# Patient Record
Sex: Female | Born: 1935 | Race: White | Hispanic: No | Marital: Married | Smoking: Never smoker
Health system: Southern US, Community
[De-identification: ages and names within clinical notes are randomized; demographics above are authoritative.]

## PROBLEM LIST (undated history)

## (undated) DIAGNOSIS — Z950 Presence of cardiac pacemaker: Secondary | ICD-10-CM

## (undated) DIAGNOSIS — Z9289 Personal history of other medical treatment: Secondary | ICD-10-CM

## (undated) DIAGNOSIS — H547 Unspecified visual loss: Secondary | ICD-10-CM

## (undated) DIAGNOSIS — I442 Atrioventricular block, complete: Secondary | ICD-10-CM

## (undated) DIAGNOSIS — R55 Syncope and collapse: Secondary | ICD-10-CM

## (undated) DIAGNOSIS — F0390 Unspecified dementia without behavioral disturbance: Secondary | ICD-10-CM

## (undated) DIAGNOSIS — M1990 Unspecified osteoarthritis, unspecified site: Secondary | ICD-10-CM

## (undated) DIAGNOSIS — G629 Polyneuropathy, unspecified: Secondary | ICD-10-CM

## (undated) HISTORY — PX: JOINT REPLACEMENT: SHX530

## (undated) HISTORY — DX: Atrioventricular block, complete: I44.2

## (undated) HISTORY — PX: FRACTURE SURGERY: SHX138

## (undated) HISTORY — PX: SKIN GRAFT: SHX250

## (undated) HISTORY — PX: CATARACT EXTRACTION W/ INTRAOCULAR LENS  IMPLANT, BILATERAL: SHX1307

## (undated) HISTORY — PX: TONSILLECTOMY AND ADENOIDECTOMY: SUR1326

---
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
## 1978-09-17 HISTORY — PX: TOTAL KNEE ARTHROPLASTY: SHX125

## 1978-09-17 HISTORY — PX: CHOLECYSTECTOMY: SHX55

## 1988-09-16 HISTORY — PX: FEMUR SURGERY: SHX943

## 2007-10-02 ENCOUNTER — Ambulatory Visit: Payer: Self-pay | Admitting: Internal Medicine

## 2007-10-11 ENCOUNTER — Ambulatory Visit: Payer: Self-pay | Admitting: Internal Medicine

## 2007-10-11 LAB — CONVERTED CEMR LAB
CO2: 31 meq/L (ref 19–32)
Calcium: 8.8 mg/dL (ref 8.4–10.5)
Chloride: 104 meq/L (ref 96–112)
Creatinine, Ser: 0.7 mg/dL (ref 0.4–1.2)
Eosinophils Relative: 0.6 % (ref 0.0–5.0)
GFR calc Af Amer: 102 mL/min
Glucose, Bld: 94 mg/dL (ref 70–99)
Hemoglobin: 11.1 g/dL — ABNORMAL LOW (ref 12.0–15.0)
Lymphocytes Relative: 24.2 % (ref 12.0–46.0)
MCV: 92.2 fL (ref 78.0–100.0)
Monocytes Absolute: 0.4 10*3/uL (ref 0.1–1.0)
Monocytes Relative: 5.5 % (ref 3.0–12.0)
Neutro Abs: 5.4 10*3/uL (ref 1.4–7.7)
Neutrophils Relative %: 69.1 % (ref 43.0–77.0)
Platelets: 265 10*3/uL (ref 150–400)
RBC: 3.6 M/uL — ABNORMAL LOW (ref 3.87–5.11)
RDW: 15.1 % — ABNORMAL HIGH (ref 11.5–14.6)
WBC: 7.6 10*3/uL (ref 4.5–10.5)
aPTT: 31.9 s — ABNORMAL HIGH (ref 21.7–29.8)

## 2007-10-17 ENCOUNTER — Ambulatory Visit: Payer: Self-pay | Admitting: Internal Medicine

## 2007-10-17 ENCOUNTER — Ambulatory Visit (HOSPITAL_COMMUNITY): Admission: AD | Admit: 2007-10-17 | Discharge: 2007-10-19 | Payer: Self-pay | Admitting: Internal Medicine

## 2007-11-07 ENCOUNTER — Ambulatory Visit: Payer: Self-pay

## 2007-12-02 ENCOUNTER — Emergency Department (HOSPITAL_COMMUNITY): Admission: EM | Admit: 2007-12-02 | Discharge: 2007-12-02 | Payer: Self-pay | Admitting: Emergency Medicine

## 2008-02-04 ENCOUNTER — Ambulatory Visit: Payer: Self-pay | Admitting: Internal Medicine

## 2008-04-24 ENCOUNTER — Encounter: Payer: Self-pay | Admitting: Internal Medicine

## 2008-12-28 DIAGNOSIS — I459 Conduction disorder, unspecified: Secondary | ICD-10-CM

## 2008-12-29 ENCOUNTER — Ambulatory Visit: Payer: Self-pay | Admitting: Internal Medicine

## 2009-04-02 ENCOUNTER — Encounter: Payer: Self-pay | Admitting: Internal Medicine

## 2009-06-15 ENCOUNTER — Emergency Department (HOSPITAL_COMMUNITY): Admission: EM | Admit: 2009-06-15 | Discharge: 2009-06-15 | Payer: Self-pay | Admitting: Emergency Medicine

## 2009-08-03 ENCOUNTER — Encounter: Payer: Self-pay | Admitting: Internal Medicine

## 2009-10-20 ENCOUNTER — Encounter (HOSPITAL_BASED_OUTPATIENT_CLINIC_OR_DEPARTMENT_OTHER)
Admission: RE | Admit: 2009-10-20 | Discharge: 2010-01-18 | Payer: Self-pay | Source: Home / Self Care | Attending: Internal Medicine | Admitting: Internal Medicine

## 2009-12-20 ENCOUNTER — Ambulatory Visit: Payer: Self-pay | Admitting: Surgery

## 2010-01-11 ENCOUNTER — Encounter (INDEPENDENT_AMBULATORY_CARE_PROVIDER_SITE_OTHER): Payer: Self-pay | Admitting: *Deleted

## 2010-01-20 ENCOUNTER — Encounter (HOSPITAL_BASED_OUTPATIENT_CLINIC_OR_DEPARTMENT_OTHER)
Admission: RE | Admit: 2010-01-20 | Discharge: 2010-02-15 | Payer: Self-pay | Source: Home / Self Care | Attending: Internal Medicine | Admitting: Internal Medicine

## 2010-02-15 NOTE — Letter (Signed)
Summary: Device-Delinquent Phone Journalist, newspaper, Main Office  [address]   [place], [place] [address]   Phone: [phone]  Fax: [phone]     April 02, 2009 MRN: [id number]   [name] [address] [place], [place]  [address]   Dear Ms. [name],  According to our records, you were scheduled for a device phone transmission on  March 31, 2009.     We did not receive any results from this check.  If you transmitted on your scheduled day, please call us to help troubleshoot your system.  If you forgot to send your transmission, please send one upon receipt of this letter.  Thank you,   Architectural technologist [facility]

## 2010-02-15 NOTE — Letter (Signed)
Summary: Device-Delinquent Phone Journalist, newspaper, Main Office  [address]   [place], [place] [address]   Phone: [phone]  Fax: [phone]     August 03, 2009 MRN: [id number]   [name] [address] [place], [place]  [address]   Dear Ms. LANSDOWNE,  According to our records, you were scheduled for a device phone transmission on  03-31-2009.     We did not receive any results from this check.  If you transmitted on your scheduled day, please call us to help troubleshoot your system.  If you forgot to send your transmission, please send one upon receipt of this letter.  Thank you,   Architectural technologist [facility]

## 2010-02-17 ENCOUNTER — Encounter (HOSPITAL_BASED_OUTPATIENT_CLINIC_OR_DEPARTMENT_OTHER): Payer: Medicare Other | Attending: Internal Medicine

## 2010-02-17 DIAGNOSIS — G5790 Unspecified mononeuropathy of unspecified lower limb: Secondary | ICD-10-CM | POA: Insufficient documentation

## 2010-02-17 DIAGNOSIS — X58XXXA Exposure to other specified factors, initial encounter: Secondary | ICD-10-CM | POA: Insufficient documentation

## 2010-02-17 DIAGNOSIS — L97409 Non-pressure chronic ulcer of unspecified heel and midfoot with unspecified severity: Secondary | ICD-10-CM | POA: Insufficient documentation

## 2010-02-17 DIAGNOSIS — S91309A Unspecified open wound, unspecified foot, initial encounter: Secondary | ICD-10-CM | POA: Insufficient documentation

## 2010-02-17 NOTE — Letter (Signed)
Summary: Device-Delinquent Check  [facility], Main Office  [address]   [place], [place] [address]   Phone: [phone]  Fax: [phone]     January 11, 2010 MRN: [id number]   [name] [address] [place], [place]  [address]   Dear [name],  According to our records, you have not had your implanted device checked in the recommended period of time.  We are unable to determine appropriate device function without checking your device on a regular basis.  Please call our office to schedule an appointment with Dr Graciela Husbands as soon as possible.  If you are having your device checked by another physician, please call us so that we may update our records.  Thank you,  Vella Kohler  January 11, 2010 4:28 PM  [facility] certified

## 2010-03-09 ENCOUNTER — Encounter: Payer: Medicare Other | Admitting: Internal Medicine

## 2010-03-15 ENCOUNTER — Encounter: Payer: Medicare Other | Admitting: Internal Medicine

## 2010-03-17 ENCOUNTER — Encounter (HOSPITAL_BASED_OUTPATIENT_CLINIC_OR_DEPARTMENT_OTHER): Payer: Medicare Other | Attending: Internal Medicine

## 2010-03-17 DIAGNOSIS — S91309A Unspecified open wound, unspecified foot, initial encounter: Secondary | ICD-10-CM | POA: Insufficient documentation

## 2010-03-17 DIAGNOSIS — X58XXXA Exposure to other specified factors, initial encounter: Secondary | ICD-10-CM | POA: Insufficient documentation

## 2010-03-17 DIAGNOSIS — L97409 Non-pressure chronic ulcer of unspecified heel and midfoot with unspecified severity: Secondary | ICD-10-CM | POA: Insufficient documentation

## 2010-03-17 DIAGNOSIS — G5790 Unspecified mononeuropathy of unspecified lower limb: Secondary | ICD-10-CM | POA: Insufficient documentation

## 2010-04-07 ENCOUNTER — Encounter: Payer: Self-pay | Admitting: Internal Medicine

## 2010-04-19 ENCOUNTER — Ambulatory Visit (INDEPENDENT_AMBULATORY_CARE_PROVIDER_SITE_OTHER): Payer: Medicare Other | Admitting: Internal Medicine

## 2010-04-19 ENCOUNTER — Encounter: Payer: Self-pay | Admitting: Internal Medicine

## 2010-04-19 DIAGNOSIS — I459 Conduction disorder, unspecified: Secondary | ICD-10-CM

## 2010-04-19 DIAGNOSIS — Z950 Presence of cardiac pacemaker: Secondary | ICD-10-CM | POA: Insufficient documentation

## 2010-04-19 NOTE — Assessment & Plan Note (Signed)
The patient's device was interrogated.  The information was reviewed. No changes were made in the programming.    

## 2010-04-19 NOTE — Patient Instructions (Signed)
Your physician recommends that you schedule a follow-up appointment in: [place] IN 6 MONTHS

## 2010-04-19 NOTE — Assessment & Plan Note (Signed)
Stable

## 2010-04-19 NOTE — Progress Notes (Signed)
  HPI  [name] is a 77 y.o. female seen in followup for pacemaker implanted in [place] for complete heart block Which was changed out here in the fall of 2010  . She is getting precipitating undergoing graft surgery for nonhealing wound on her leg. She denies problems with chest pain or shortness of breath. She denies sob or chest pain  Past Medical History  Diagnosis Date  . Complete heart block   . S/P placement of cardiac pacemaker     FOR COMPLETE HEART BLOCK....MEDTRONIC ADAPTA R01    Past Surgical History  Procedure Date  . Total knee arthroplasty     BILATERAL  . Cholecystectomy   . Appendectomy   . Insert / replace / remove pacemaker     PACEMAKER IMPLANT- MEDTRONIC ADAPTA R01    Current Outpatient Prescriptions  Medication Sig Dispense Refill  . cephALEXin (KEFLEX) 500 MG capsule Take 500 mg by mouth 2 (two) times daily. For 10 days for foot infection       . cholecalciferol (VITAMIN D) 1000 UNITS tablet Take 1,000 Units by mouth daily.        . DULoxetine (CYMBALTA) 60 MG capsule Take 60 mg by mouth 2 (two) times daily.        . fish oil-omega-3 fatty acids 1000 MG capsule Take 2 g by mouth daily.        Marland Kitchen gabapentin (NEURONTIN) 300 MG capsule Take 300 mg by mouth at bedtime. TAKE 2 TABS QHS       . naproxen sodium (ANAPROX) 220 MG tablet Take 220 mg by mouth 2 (two) times daily with a meal.        . traMADol (ULTRAM) 50 MG tablet Take 50 mg by mouth every 6 (six) hours as needed.        . traZODone (DESYREL) 50 MG tablet Take 50 mg by mouth at bedtime.        Marland Kitchen DISCONTD: aspirin 81 MG EC tablet Take 81 mg by mouth daily.        Marland Kitchen DISCONTD: Magnesium 250 MG TABS Take 2 tablets by mouth daily.        Marland Kitchen DISCONTD: potassium chloride (KLOR-CON) 10 MEQ CR tablet Take 10 mEq by mouth daily.          Allergies  Allergen Reactions  . Latex     Bandaging and tape     Review of Systems negative except from HPI and PMH  Physical Exam Well developed and well  nourished in no acute distress HENT normal E scleral and icterus clear Neck Supple Clear to ausculation Regular rate and rhythm, no murmurs gallops or rub Soft with active bowel sounds No clubbing cyanosis; her foot is well  Bandaged Alert and oriented, grossly normal motor and sensory function Skin Warm and Dry     Assessment and  Plan

## 2010-04-21 ENCOUNTER — Encounter (HOSPITAL_BASED_OUTPATIENT_CLINIC_OR_DEPARTMENT_OTHER): Payer: Medicare Other | Attending: Internal Medicine

## 2010-04-21 DIAGNOSIS — G5790 Unspecified mononeuropathy of unspecified lower limb: Secondary | ICD-10-CM | POA: Insufficient documentation

## 2010-04-21 DIAGNOSIS — S91309A Unspecified open wound, unspecified foot, initial encounter: Secondary | ICD-10-CM | POA: Insufficient documentation

## 2010-04-21 DIAGNOSIS — L97409 Non-pressure chronic ulcer of unspecified heel and midfoot with unspecified severity: Secondary | ICD-10-CM | POA: Insufficient documentation

## 2010-04-21 DIAGNOSIS — X58XXXA Exposure to other specified factors, initial encounter: Secondary | ICD-10-CM | POA: Insufficient documentation

## 2010-05-19 ENCOUNTER — Encounter (HOSPITAL_BASED_OUTPATIENT_CLINIC_OR_DEPARTMENT_OTHER): Payer: Medicare Other | Attending: Internal Medicine

## 2010-05-19 DIAGNOSIS — L97509 Non-pressure chronic ulcer of other part of unspecified foot with unspecified severity: Secondary | ICD-10-CM | POA: Insufficient documentation

## 2010-05-19 DIAGNOSIS — Z79899 Other long term (current) drug therapy: Secondary | ICD-10-CM | POA: Insufficient documentation

## 2010-05-19 DIAGNOSIS — Z950 Presence of cardiac pacemaker: Secondary | ICD-10-CM | POA: Insufficient documentation

## 2010-05-19 DIAGNOSIS — G5790 Unspecified mononeuropathy of unspecified lower limb: Secondary | ICD-10-CM | POA: Insufficient documentation
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
## 2010-05-31 NOTE — Op Note (Signed)
[name], [name] NO.:  [id number]   MEDICAL RECORD NO.:  [id number]          PATIENT TYPE:  OIB   LOCATION:  2899                         FACILITY:  [facility]   PHYSICIAN:  Duke Salvia, MD, FACCDATE OF BIRTH:  [birth year]   DATE OF PROCEDURE:  10/17/2007  DATE OF DISCHARGE:                               OPERATIVE REPORT   PREOPERATIVE DIAGNOSIS:  Previously implanted pacemaker, now at end of  life.   POSTOPERATIVE DIAGNOSIS:  Previously implanted pacemaker, now at end of  life.   PROCEDURES:  Temporary transvenous pacemaker insertion via the right  femoral vein; explantation of the pulse generator; pocket revision;  insertion of a new single chamber pulse generator.   COMPLICATIONS:  None apparent.   PROCEDURE IN DETAIL:  Following obtaining informed consent, the patient  was brought to the Electrophysiology Laboratory and placed on the  fluoroscopic table in supine position.  After routine prep and drape,  cardiac catheterization was performed via the right femoral vein.  A 6-  French sheath was placed and through this was passed a temporary  transvenous pacing lead up into the right ventricle where pacing was  successfully accomplished.  This pacing was then placed in the backup  mode.   I then prepped again and after routine prep and drape of the left upper  chest, lidocaine was infiltrated along the line of the previous incision  and carried down to the layer of the device pocket using sharp  dissection and electrocautery.  The pocket was opened.  The device was  explanted.  The temporary transvenous pacemaker was activated.  The  leads were removed from the can actually without difficulty which was a  pleasant surprise.  The previously implanted ventricular lead was a 4456  lead implanted on November 2002, serial #[id number].  There was no intrinsic  ventricular rhythm, but the sensed R-wave from the pacer was 16.3 with a  pace impedance of 384,  threshold 0.3 volts at 0.5 milliseconds, and the  current of threshold was 1.4 mA.  This lead was then attached to a  Medtronic Adapta R01 pulse generator, serial [id number].  Ventricular  pacing was identified.  The atrial lead had retracted and it was  vertically deployed in the right atrium.  The patient was in atrial  fibrillation, so it was elected to cap the right atrial lead.  The lead  and the pulse generator were then placed in the pocket and secured to  the prepectoral fascia.  Hemostasis was obtained.  The pocket was  copiously irrigated with antibiotic containing saline solution.  I  should note that I had removed the floor of the pocket in terms to the  fibrous tissue to try to promote adequacy of fresh tissue perfusion to  minimize the likelihood of infection.  The wound was then closed in 3  layers in normal fashion.  The wound was washed, dried, and a benzoin  and Steri-Strip dressing was applied.  Needle counts, sponge counts, and  instrument counts were correct at the end of procedure according to the  staff.  The patient  tolerated the procedure without apparent  complication.      Duke Salvia, MD, [facility]  Electronically Signed    SCK/MEDQ  D:  10/17/2007  T:  10/17/2007  Job:  [id number]

## 2010-05-31 NOTE — Op Note (Signed)
[name], [name] NO.:  [id number]   MEDICAL RECORD NO.:  [id number]          PATIENT TYPE:  INP   LOCATION:  3705                         FACILITY:  [facility]   PHYSICIAN:  Duke Salvia, MD, FACCDATE OF BIRTH:  [birth year]   DATE OF PROCEDURE:  10/18/2007  DATE OF DISCHARGE:                               OPERATIVE REPORT   [name] is brought to the EP lab today following the replacement  of a dual-chamber pacemaker generator with a single chamber device  yesterday at my hands which was an error.  She received preprocedural  antibiotics after routine prep and drape.  Lidocaine was infiltrated  along the line of the previous incision.  Incision was opened and  carried down to the layer of the device pocket.  The device was  explanted.  The atrial lead which had been capped yesterday was opened  up.  Sensing demonstrated P-wave of 1.7, but it failed to capture and  fluoroscopy of the lead confirmed it was in a vertical position in the  right atrium, hence its failure to function normally.  The leads were  then attached to a new Medtronic generator, model VEDRO1, serial number  [id number].  P-synchronous pacing was identified.  Surgicel was placed at  the cephalad aspect of the pocket.  The pocket was copiously irrigated  with antibiotic containing saline solution.  The wound was closed in 3  layers in normal fashion.  The wound was washed, dried, and a benzoin  and Steri-Strip dressing was applied.  Needle counts, sponge counts, and  instrument counts were correct at the end of the procedure according to  the staff.  The patient tolerated the procedure without apparent  complication.      Duke Salvia, MD, [facility]  Electronically Signed     SCK/MEDQ  D:  10/18/2007  T:  10/19/2007  Job:  [id number]

## 2010-06-23 ENCOUNTER — Encounter (HOSPITAL_BASED_OUTPATIENT_CLINIC_OR_DEPARTMENT_OTHER): Payer: Medicare Other | Attending: Internal Medicine

## 2010-06-23 DIAGNOSIS — L97509 Non-pressure chronic ulcer of other part of unspecified foot with unspecified severity: Secondary | ICD-10-CM | POA: Insufficient documentation

## 2010-06-23 DIAGNOSIS — Z79899 Other long term (current) drug therapy: Secondary | ICD-10-CM | POA: Insufficient documentation

## 2010-06-23 DIAGNOSIS — Z950 Presence of cardiac pacemaker: Secondary | ICD-10-CM | POA: Insufficient documentation

## 2010-06-23 DIAGNOSIS — G5790 Unspecified mononeuropathy of unspecified lower limb: Secondary | ICD-10-CM | POA: Insufficient documentation

## 2010-07-21 ENCOUNTER — Encounter (HOSPITAL_BASED_OUTPATIENT_CLINIC_OR_DEPARTMENT_OTHER): Payer: Medicare Other | Attending: Internal Medicine

## 2010-07-21 DIAGNOSIS — X58XXXA Exposure to other specified factors, initial encounter: Secondary | ICD-10-CM | POA: Insufficient documentation

## 2010-07-21 DIAGNOSIS — S99929A Unspecified injury of unspecified foot, initial encounter: Secondary | ICD-10-CM | POA: Insufficient documentation

## 2010-07-21 DIAGNOSIS — L97509 Non-pressure chronic ulcer of other part of unspecified foot with unspecified severity: Secondary | ICD-10-CM | POA: Insufficient documentation

## 2010-07-21 DIAGNOSIS — L84 Corns and callosities: Secondary | ICD-10-CM | POA: Insufficient documentation

## 2010-07-21 DIAGNOSIS — S8990XA Unspecified injury of unspecified lower leg, initial encounter: Secondary | ICD-10-CM | POA: Insufficient documentation

## 2010-07-21 DIAGNOSIS — G589 Mononeuropathy, unspecified: Secondary | ICD-10-CM | POA: Insufficient documentation

## 2010-08-18 ENCOUNTER — Encounter (HOSPITAL_BASED_OUTPATIENT_CLINIC_OR_DEPARTMENT_OTHER): Payer: Medicare Other | Attending: Internal Medicine

## 2010-08-18 DIAGNOSIS — S8990XA Unspecified injury of unspecified lower leg, initial encounter: Secondary | ICD-10-CM | POA: Insufficient documentation

## 2010-08-18 DIAGNOSIS — L84 Corns and callosities: Secondary | ICD-10-CM | POA: Insufficient documentation

## 2010-08-18 DIAGNOSIS — X58XXXA Exposure to other specified factors, initial encounter: Secondary | ICD-10-CM | POA: Insufficient documentation

## 2010-08-18 DIAGNOSIS — G589 Mononeuropathy, unspecified: Secondary | ICD-10-CM | POA: Insufficient documentation

## 2010-08-18 DIAGNOSIS — S99929A Unspecified injury of unspecified foot, initial encounter: Secondary | ICD-10-CM | POA: Insufficient documentation

## 2010-08-18 DIAGNOSIS — L97509 Non-pressure chronic ulcer of other part of unspecified foot with unspecified severity: Secondary | ICD-10-CM | POA: Insufficient documentation

## 2010-09-22 ENCOUNTER — Encounter (HOSPITAL_BASED_OUTPATIENT_CLINIC_OR_DEPARTMENT_OTHER): Payer: Medicare Other | Attending: Internal Medicine

## 2010-09-22 DIAGNOSIS — G5790 Unspecified mononeuropathy of unspecified lower limb: Secondary | ICD-10-CM | POA: Insufficient documentation

## 2010-09-22 DIAGNOSIS — L97509 Non-pressure chronic ulcer of other part of unspecified foot with unspecified severity: Secondary | ICD-10-CM | POA: Insufficient documentation

## 2010-10-20 ENCOUNTER — Encounter (HOSPITAL_BASED_OUTPATIENT_CLINIC_OR_DEPARTMENT_OTHER): Payer: Medicare Other | Attending: Internal Medicine

## 2010-10-20 DIAGNOSIS — G5790 Unspecified mononeuropathy of unspecified lower limb: Secondary | ICD-10-CM | POA: Insufficient documentation

## 2010-10-20 DIAGNOSIS — L97509 Non-pressure chronic ulcer of other part of unspecified foot with unspecified severity: Secondary | ICD-10-CM | POA: Insufficient documentation

## 2011-01-12 ENCOUNTER — Encounter (HOSPITAL_BASED_OUTPATIENT_CLINIC_OR_DEPARTMENT_OTHER): Payer: Medicare Other

## 2011-01-19 ENCOUNTER — Encounter (HOSPITAL_BASED_OUTPATIENT_CLINIC_OR_DEPARTMENT_OTHER): Payer: Medicare Other | Attending: Internal Medicine

## 2011-01-19 DIAGNOSIS — L988 Other specified disorders of the skin and subcutaneous tissue: Secondary | ICD-10-CM | POA: Insufficient documentation

## 2011-01-19 DIAGNOSIS — L84 Corns and callosities: Secondary | ICD-10-CM | POA: Insufficient documentation

## 2011-01-19 DIAGNOSIS — G5790 Unspecified mononeuropathy of unspecified lower limb: Secondary | ICD-10-CM | POA: Insufficient documentation

## 2011-01-19 DIAGNOSIS — L905 Scar conditions and fibrosis of skin: Secondary | ICD-10-CM | POA: Insufficient documentation

## 2011-02-09 ENCOUNTER — Encounter (HOSPITAL_BASED_OUTPATIENT_CLINIC_OR_DEPARTMENT_OTHER): Payer: Medicare Other

## 2011-02-23 ENCOUNTER — Encounter (HOSPITAL_BASED_OUTPATIENT_CLINIC_OR_DEPARTMENT_OTHER): Payer: Medicare Other | Attending: Internal Medicine

## 2011-02-23 DIAGNOSIS — Z8614 Personal history of Methicillin resistant Staphylococcus aureus infection: Secondary | ICD-10-CM | POA: Insufficient documentation

## 2011-02-23 DIAGNOSIS — Z950 Presence of cardiac pacemaker: Secondary | ICD-10-CM | POA: Insufficient documentation

## 2011-02-23 DIAGNOSIS — G5790 Unspecified mononeuropathy of unspecified lower limb: Secondary | ICD-10-CM | POA: Insufficient documentation

## 2011-02-23 DIAGNOSIS — Z79899 Other long term (current) drug therapy: Secondary | ICD-10-CM | POA: Insufficient documentation

## 2011-02-23 DIAGNOSIS — Y838 Other surgical procedures as the cause of abnormal reaction of the patient, or of later complication, without mention of misadventure at the time of the procedure: Secondary | ICD-10-CM | POA: Insufficient documentation

## 2011-02-23 DIAGNOSIS — L97509 Non-pressure chronic ulcer of other part of unspecified foot with unspecified severity: Secondary | ICD-10-CM | POA: Insufficient documentation

## 2011-02-23 DIAGNOSIS — T85698A Other mechanical complication of other specified internal prosthetic devices, implants and grafts, initial encounter: Secondary | ICD-10-CM | POA: Insufficient documentation

## 2011-03-01 ENCOUNTER — Telehealth: Payer: Self-pay | Admitting: Internal Medicine

## 2011-03-01 NOTE — Telephone Encounter (Signed)
03-01-11 pt's h# changed to non published number, lmm @ 1020a for pt's dtr lisa Crowson to call to set up pacemaker ck with klein/mt

## 2011-03-23 ENCOUNTER — Encounter (HOSPITAL_BASED_OUTPATIENT_CLINIC_OR_DEPARTMENT_OTHER): Payer: Medicare Other | Attending: Internal Medicine

## 2011-03-23 DIAGNOSIS — L97509 Non-pressure chronic ulcer of other part of unspecified foot with unspecified severity: Secondary | ICD-10-CM | POA: Insufficient documentation

## 2011-03-23 DIAGNOSIS — Y838 Other surgical procedures as the cause of abnormal reaction of the patient, or of later complication, without mention of misadventure at the time of the procedure: Secondary | ICD-10-CM | POA: Insufficient documentation

## 2011-03-23 DIAGNOSIS — Z79899 Other long term (current) drug therapy: Secondary | ICD-10-CM | POA: Insufficient documentation

## 2011-03-23 DIAGNOSIS — G5790 Unspecified mononeuropathy of unspecified lower limb: Secondary | ICD-10-CM | POA: Insufficient documentation

## 2011-04-11 ENCOUNTER — Encounter: Payer: Medicare Other | Admitting: Internal Medicine

## 2011-04-17 ENCOUNTER — Other Ambulatory Visit: Payer: Self-pay | Admitting: Cardiology

## 2011-04-17 ENCOUNTER — Encounter: Payer: Medicare Other | Admitting: Internal Medicine

## 2011-04-17 DIAGNOSIS — I739 Peripheral vascular disease, unspecified: Secondary | ICD-10-CM

## 2011-04-18 ENCOUNTER — Encounter (INDEPENDENT_AMBULATORY_CARE_PROVIDER_SITE_OTHER): Payer: Medicare Other

## 2011-04-18 DIAGNOSIS — L98499 Non-pressure chronic ulcer of skin of other sites with unspecified severity: Secondary | ICD-10-CM

## 2011-04-18 DIAGNOSIS — I739 Peripheral vascular disease, unspecified: Secondary | ICD-10-CM

## 2011-04-20 ENCOUNTER — Encounter (HOSPITAL_BASED_OUTPATIENT_CLINIC_OR_DEPARTMENT_OTHER): Payer: Medicare Other

## 2011-05-17 ENCOUNTER — Encounter: Payer: Medicare Other | Admitting: Internal Medicine

## 2011-05-18 ENCOUNTER — Encounter (HOSPITAL_BASED_OUTPATIENT_CLINIC_OR_DEPARTMENT_OTHER): Payer: Medicare Other

## 2011-06-21 ENCOUNTER — Encounter: Payer: Self-pay | Admitting: *Deleted

## 2011-06-27 ENCOUNTER — Encounter: Payer: Medicare Other | Admitting: Internal Medicine

## 2011-08-02 ENCOUNTER — Inpatient Hospital Stay (HOSPITAL_COMMUNITY)
Admission: EM | Admit: 2011-08-02 | Discharge: 2011-08-07 | DRG: 690 | Disposition: A | Discharge status: Skilled Nursing Facility | Payer: Medicare Other | Attending: Internal Medicine | Admitting: Internal Medicine

## 2011-08-02 ENCOUNTER — Emergency Department (HOSPITAL_COMMUNITY): Payer: Medicare Other

## 2011-08-02 ENCOUNTER — Encounter (HOSPITAL_COMMUNITY): Payer: Self-pay

## 2011-08-02 DIAGNOSIS — I459 Conduction disorder, unspecified: Secondary | ICD-10-CM

## 2011-08-02 DIAGNOSIS — I442 Atrioventricular block, complete: Secondary | ICD-10-CM | POA: Diagnosis present

## 2011-08-02 DIAGNOSIS — W19XXXA Unspecified fall, initial encounter: Secondary | ICD-10-CM | POA: Diagnosis present

## 2011-08-02 DIAGNOSIS — Y998 Other external cause status: Secondary | ICD-10-CM

## 2011-08-02 DIAGNOSIS — R531 Weakness: Secondary | ICD-10-CM

## 2011-08-02 DIAGNOSIS — I499 Cardiac arrhythmia, unspecified: Secondary | ICD-10-CM

## 2011-08-02 DIAGNOSIS — Z950 Presence of cardiac pacemaker: Secondary | ICD-10-CM

## 2011-08-02 DIAGNOSIS — I498 Other specified cardiac arrhythmias: Secondary | ICD-10-CM

## 2011-08-02 DIAGNOSIS — N390 Urinary tract infection, site not specified: Principal | ICD-10-CM

## 2011-08-02 DIAGNOSIS — Y9210 Unspecified residential institution as the place of occurrence of the external cause: Secondary | ICD-10-CM | POA: Diagnosis present

## 2011-08-02 DIAGNOSIS — T50995A Adverse effect of other drugs, medicaments and biological substances, initial encounter: Secondary | ICD-10-CM | POA: Diagnosis present

## 2011-08-02 DIAGNOSIS — I4891 Unspecified atrial fibrillation: Secondary | ICD-10-CM | POA: Diagnosis present

## 2011-08-02 DIAGNOSIS — R55 Syncope and collapse: Secondary | ICD-10-CM

## 2011-08-02 DIAGNOSIS — F0390 Unspecified dementia without behavioral disturbance: Secondary | ICD-10-CM | POA: Diagnosis present

## 2011-08-02 HISTORY — DX: Polyneuropathy, unspecified: G62.9

## 2011-08-02 HISTORY — DX: Presence of cardiac pacemaker: Z95.0

## 2011-08-02 HISTORY — DX: Unspecified osteoarthritis, unspecified site: M19.90

## 2011-08-02 HISTORY — DX: Unspecified dementia without behavioral disturbance: F03.90

## 2011-08-02 HISTORY — DX: Syncope and collapse: R55

## 2011-08-02 HISTORY — DX: Unspecified dementia, unspecified severity, without behavioral disturbance, psychotic disturbance, mood disturbance, and anxiety: F03.90

## 2011-08-02 HISTORY — DX: Unspecified visual loss: H54.7

## 2011-08-02 HISTORY — DX: Personal history of other medical treatment: Z92.89

## 2011-08-02 LAB — BASIC METABOLIC PANEL
BUN: 16 mg/dL (ref 6–23)
CO2: 27 mEq/L (ref 19–32)
Chloride: 104 mEq/L (ref 96–112)
Creatinine, Ser: 0.75 mg/dL (ref 0.50–1.10)
GFR calc non Af Amer: 73 mL/min — ABNORMAL LOW (ref >90)
Glucose, Bld: 115 mg/dL — ABNORMAL HIGH (ref 70–99)

## 2011-08-02 LAB — URINALYSIS, ROUTINE W REFLEX MICROSCOPIC
Bilirubin Urine: NEGATIVE
Glucose, UA: NEGATIVE mg/dL
Specific Gravity, Urine: 1.015 (ref 1.005–1.030)

## 2011-08-02 LAB — URINE MICROSCOPIC-ADD ON

## 2011-08-02 LAB — CBC
MCH: 32.3 pg (ref 26.0–34.0)
WBC: 13.4 10*3/uL — ABNORMAL HIGH (ref 4.0–10.5)

## 2011-08-02 MED ORDER — CEFTRIAXONE SODIUM 1 G IJ SOLR
1.0000 g | INTRAMUSCULAR | Status: DC
  Administered 2011-08-03: 1 g via INTRAVENOUS
  Filled 2011-08-02 (×2): qty 10

## 2011-08-02 MED ORDER — ACETAMINOPHEN 325 MG PO TABS: 650.0000 mg | ORAL_TABLET | Freq: Four times a day (QID) | ORAL | Status: DC | PRN

## 2011-08-02 MED ORDER — CEPHALEXIN 500 MG PO CAPS: 500.0000 mg | ORAL_CAPSULE | Freq: Four times a day (QID) | ORAL | Status: AC

## 2011-08-02 MED ORDER — HYDROCODONE-ACETAMINOPHEN 5-325 MG PO TABS
1.0000 | ORAL_TABLET | ORAL | Status: DC | PRN
  Administered 2011-08-02: 1 via ORAL
  Filled 2011-08-02: qty 1

## 2011-08-02 MED ORDER — ENOXAPARIN SODIUM 40 MG/0.4ML ~~LOC~~ SOLN
40.0000 mg | Freq: Every day | SUBCUTANEOUS | Status: DC
  Administered 2011-08-03 – 2011-08-07 (×4): 40 mg via SUBCUTANEOUS
  Filled 2011-08-02 (×5): qty 0.4

## 2011-08-02 MED ORDER — GABAPENTIN 300 MG PO CAPS
600.0000 mg | ORAL_CAPSULE | Freq: Three times a day (TID) | ORAL | Status: DC
  Administered 2011-08-03 – 2011-08-07 (×15): 600 mg via ORAL
  Filled 2011-08-02 (×17): qty 2

## 2011-08-02 MED ORDER — MORPHINE SULFATE 4 MG/ML IJ SOLN
4.0000 mg | Freq: Once | INTRAMUSCULAR | Status: AC
  Administered 2011-08-02: 4 mg via INTRAVENOUS
  Filled 2011-08-02: qty 1

## 2011-08-02 MED ORDER — DEXTROSE 5 % IV SOLN
1.0000 g | Freq: Once | INTRAVENOUS | Status: AC
  Administered 2011-08-02: 1 g via INTRAVENOUS
  Filled 2011-08-02: qty 10

## 2011-08-02 MED ORDER — SODIUM CHLORIDE 0.9 % IV BOLUS (SEPSIS)
1000.0000 mL | Freq: Once | INTRAVENOUS | Status: AC
  Administered 2011-08-02: 1000 mL via INTRAVENOUS

## 2011-08-02 MED ORDER — ACETAMINOPHEN 650 MG RE SUPP: 650.0000 mg | Freq: Four times a day (QID) | RECTAL | Status: DC | PRN

## 2011-08-02 MED ORDER — DULOXETINE HCL 60 MG PO CPEP
60.0000 mg | ORAL_CAPSULE | Freq: Two times a day (BID) | ORAL | Status: DC
  Administered 2011-08-03 – 2011-08-07 (×10): 60 mg via ORAL
  Filled 2011-08-02 (×11): qty 1

## 2011-08-02 MED ORDER — TRAZODONE HCL 50 MG PO TABS
50.0000 mg | ORAL_TABLET | Freq: Every evening | ORAL | Status: DC | PRN
  Filled 2011-08-02: qty 1
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
## 2011-08-02 NOTE — ED Notes (Signed)
Reported syncopal episode 

## 2011-08-02 NOTE — ED Notes (Signed)
Charge RN speaking to family

## 2011-08-02 NOTE — ED Notes (Signed)
Patient transported to CDU with all personal belongings. Family at bedside.

## 2011-08-02 NOTE — ED Notes (Signed)
Spoke with daughter. Pt/family requesting admission. Assured pt/family that RN will speak with EDP and will closely f/u. Discharge still pending. Will continue to monitor.

## 2011-08-02 NOTE — ED Provider Notes (Addendum)
Patient was turned over to me by Dr. Patria Mane.  Dr. Clide Cliff from cardiology has seen and evaluated the patient and does not believe there is any acute pacemaker problems at this time.  He does not believe the patient needs to be admitted for any observation for a cardiac etiology for syncope at this time.  Patient has had normal vital signs as well.  Patient does have a urinalysis that is potentially consistent with UTI.  We will treat her for 5 days with antibiotics and have her followup with her primary care doctor next week.  Her daughter is here at the bedside and is comfortable with this plan as well.  Regarding patient's pain from her fall she uses Aleve at home and also has Vicodin that she can use when necessary.  Nat Christen, MD 08/02/11 1839  And repeat attempts to have the patient ambulate with a walker here in the emergency department patient is unable to stand up.  Given that patient is unable to stand up she is not safe to be discharged back to her assisted living facility.  Patient will require admission under observation status for further PT OT.  Nat Christen, MD 08/02/11 2102  Discussed pt with Internal medicine teaching service for admission.    Nat Christen, MD 08/02/11 2239

## 2011-08-02 NOTE — ED Notes (Signed)
NAD noted at time of d/c home with daughter. Pt and daughter verbalized understanding of d/c inst.

## 2011-08-02 NOTE — ED Provider Notes (Addendum)
History     CSN: [id number]  Arrival date & time 08/02/11  8657   First MD Initiated Contact with Patient 08/02/11 1003      Chief Complaint  Patient presents with  . Fall     The history is provided by the patient.   The patient reports last night she was in her normal state of health and got up and was walking back from the bathroom.  The next thing she knew she was on the ground.  She does have a pacemaker in place for a history of sick sinus syndrome.  The patient denies chest pain shortness of breath.  She pain in her right hand as well as her right shoulder.  She has no pain of her right elbow.  She denies pain in her bilateral hips.  She denies chest pain or abdominal pain.  She has no neck pain.  She denies headache.  At this time her pain is moderate in severity and is located in her right shoulder and right hand  Past Medical History  Diagnosis Date  . Complete heart block   . S/P placement of cardiac pacemaker     FOR COMPLETE HEART BLOCK....MEDTRONIC ADAPTA R01    Past Surgical History  Procedure Date  . Total knee arthroplasty     BILATERAL  . Cholecystectomy   . Appendectomy   . Insert / replace / remove pacemaker     PACEMAKER IMPLANT- MEDTRONIC ADAPTA R01    No family history on file.  History  Substance Use Topics  . Smoking status: Never Smoker   . Smokeless tobacco: Never Used  . Alcohol Use: No    OB History    Grav Para Term Preterm Abortions TAB SAB Ect Mult Living                  Review of Systems  All other systems reviewed and are negative.    Allergies  Latex  Home Medications   Current Outpatient Rx  Name Route Sig Dispense Refill  . DULOXETINE HCL 60 MG PO CPEP Oral Take 60 mg by mouth 2 (two) times daily.      Marland Kitchen GABAPENTIN 300 MG PO CAPS Oral Take 600 mg by mouth every 8 (eight) hours.     Marland Kitchen HYDROCODONE-ACETAMINOPHEN 5-325 MG PO TABS Oral Take 1-2 tablets by mouth every 6 (six) hours as needed. For pain    . LORAZEPAM 2 MG  PO TABS Oral Take 2 mg by mouth at bedtime.    Marland Kitchen POTASSIUM CHLORIDE ER 10 MEQ PO TBCR Oral Take 20 mEq by mouth daily.    . TRAZODONE HCL 50 MG PO TABS Oral Take 50 mg by mouth at bedtime as needed. For sleep      BP 119/60  Pulse 73  Temp 97.6 F (36.4 C) (Oral)  Resp 15  SpO2 97%  Physical Exam  Nursing note and vitals reviewed. Constitutional: She is oriented to person, place, and time. She appears well-developed and well-nourished. No distress.  HENT:  Head: Normocephalic and atraumatic.  Eyes: EOM are normal.  Neck: Normal range of motion.  Cardiovascular: Normal rate, regular rhythm and normal heart sounds.   Pulmonary/Chest: Effort normal and breath sounds normal.  Abdominal: Soft. She exhibits no distension. There is no tenderness.  Musculoskeletal: Normal range of motion.       Tenderness and swelling of the proximal aspect of her right second metacarpal.  Overlying the dorsal surface.  She has tenderness  to palpation in this area.  Normal right radial pulse.  The patient has pain of her proximal right femurs as well as her right shoulder.  She has no obvious deformity.  Shows full range of motion of her right elbow.  Neurological: She is alert and oriented to person, place, and time.  Skin: Skin is warm and dry.  Psychiatric: She has a normal mood and affect. Judgment normal.    ED Course  Procedures (including critical care time)   Date: 08/02/2011  Rate: 81  Rhythm: Paced rhythm  Intervals: normal  ST/T Wave abnormalities: normal  Conduction Disutrbances: none  Narrative Interpretation:   Old EKG Reviewed: No significant changes noted     Labs Reviewed  CBC - Abnormal; Notable for the following:    WBC 13.4 (*)     All other components within normal limits  BASIC METABOLIC PANEL - Abnormal; Notable for the following:    Glucose, Bld 115 (*)     GFR calc non Af Amer 73 (*)     GFR calc Af Amer 84 (*)     All other components within normal limits    URINALYSIS, ROUTINE W REFLEX MICROSCOPIC - Abnormal; Notable for the following:    APPearance CLOUDY (*)     Ketones, ur 15 (*)     Leukocytes, UA MODERATE (*)     All other components within normal limits  URINE MICROSCOPIC-ADD ON - Abnormal; Notable for the following:    Squamous Epithelial / LPF FEW (*)     All other components within normal limits  TROPONIN I  URINE CULTURE   Dg Chest 2 View  08/02/2011  *RADIOLOGY REPORT*  Clinical Data: Status post fall.  Pain.  CHEST - 2 VIEW  Comparison: None.  Findings: Pacing device is in place.  Heart size is upper normal but there is no pulmonary edema.  Lungs are clear.  No pneumothorax or pleural fluid.  Degenerative change is present about the shoulders.  Both humeral heads are high-riding compatible with chronic rotator cuff tears.  There is an inferior endplate compression fracture at the thoracolumbar junction, likely T12. The fracture appears remote but cannot be definitively characterized.  IMPRESSION:  1.  No acute cardiopulmonary disease. 2.  T12 compression fracture appears remote but cannot be definitively characterized.  Original Report Authenticated By: Bernadene Bell. Maricela Curet, M.D.   Dg Shoulder Right  08/02/2011  *RADIOLOGY REPORT*  Clinical Data: 78 year old female with fall and pain.  RIGHT SHOULDER - 2+ VIEW  Comparison: Right humerus films 06/15/2009.  Findings: No glenohumeral joint dislocation.  Superior subluxation of the humeral head plus osteophytosis, subchondral sclerosis, and also degenerative spurring at the right acromioclavicular joint. The proximal right humerus appears intact. Right clavicle and scapula appear intact. Visualized right ribs and lung parenchyma are within normal limits.  IMPRESSION: Degenerative changes but no acute fracture or dislocation identified at the right shoulder.  Original Report Authenticated By: Harley Hallmark, M.D.   Dg Hand Complete Right  08/02/2011  *RADIOLOGY REPORT*  Clinical Data: Status  post fall.  Pain and swelling about the thumb.  RIGHT HAND - COMPLETE 3+ VIEW  Comparison: None.  Findings: No fracture or dislocation is identified.  The patient has marked multi focal osteoarthritis.  Degenerative change appears worst at the base of the thumb.  Soft tissues are unremarkable.  IMPRESSION: No acute finding.  Extensive degenerative disease.  Original Report Authenticated By: Bernadene Bell. Maricela Curet, M.D.    I personally reviewed  the imaging tests through PACS system  I reviewed available ER/hospitalization records thought the EMR   1. Atrial arrhythmia   2. Syncope       MDM  On interrogation of the patient's pacer maker she had an atrial rate of approximately 400 that lasted an hour and a half it occurred at the same time that her syncope did.  Her ventricular rate never reached over 107.  Given that she had an atrial abnormality and syncope yesterday evening the patient will be seen by her cardiology team in the emergency department.  I think the patient would likely benefit from overnight observation and possible medication adjustments.  From a traumatic standpoint the patient's x-rays of her right hand shoulder and chest are normal.  I don't believe the patient needs imaging of her head as she has a normal neurologic exam at this time and is not on anticoagulants.  She has no trauma noted externally on her head.  Her C-spine is cleared by Nexus criteria.  Her pain was treated in the emergency department.  Her labs and urine are otherwise unremarkable.  The patient is without complaints at this time.  She denies chest pain shortness of breath.  .  LB cards to evaluate        Lyanne Co, MD 08/02/11 1645  Lyanne Co, MD 08/02/11 1725

## 2011-08-02 NOTE — ED Notes (Signed)
Pt states she is not leaving
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
## 2011-08-02 NOTE — ED Notes (Signed)
MD in room at this time speaking with pt and family.

## 2011-08-02 NOTE — ED Notes (Signed)
Informed Dr. Golda Acre of pt/family request. MD will speak with family re: request for admission/plan of care. Updated pt/family. Will continue to monitor.

## 2011-08-02 NOTE — ED Notes (Signed)
Patient original PIV removed due to patient being d/c, patient now with decision to be admitted, new PIV started

## 2011-08-02 NOTE — ED Notes (Signed)
Reported syncopal episode last pm and irregular hr, pt was on floor all night, pt complains of right arm pain right shoulder pain and right forearm pain, pt sts that she remembers all events fromfall. Irregular hr with pt and pt has on demand pacemaker.

## 2011-08-02 NOTE — Progress Notes (Signed)
Advised ED unit secretary that insurance will not cover the cost for a "lift chair". Patient will be evaluated while inpatient for home health needs as she progresses toward discharge.

## 2011-08-03 ENCOUNTER — Inpatient Hospital Stay (HOSPITAL_COMMUNITY): Payer: Medicare Other

## 2011-08-03 DIAGNOSIS — R5383 Other fatigue: Secondary | ICD-10-CM

## 2011-08-03 DIAGNOSIS — N390 Urinary tract infection, site not specified: Principal | ICD-10-CM

## 2011-08-03 DIAGNOSIS — Z950 Presence of cardiac pacemaker: Secondary | ICD-10-CM

## 2011-08-03 LAB — URINE CULTURE: Colony Count: >=100000

## 2011-08-03 LAB — CARDIAC PANEL(CRET KIN+CKTOT+MB+TROPI)
CK, MB: 13 ng/mL (ref 0.3–4.0)
Relative Index: 1.4 (ref 0.0–2.5)
Total CK: 874 U/L — ABNORMAL HIGH (ref 7–177)

## 2011-08-03 LAB — BASIC METABOLIC PANEL
Calcium: 9.2 mg/dL (ref 8.4–10.5)
Chloride: 100 mEq/L (ref 96–112)
Creatinine, Ser: 0.64 mg/dL (ref 0.50–1.10)
GFR calc Af Amer: 89 mL/min — ABNORMAL LOW (ref >90)
Sodium: 136 mEq/L (ref 135–145)

## 2011-08-03 LAB — CBC
MCH: 32 pg (ref 26.0–34.0)
MCV: 94.1 fL (ref 78.0–100.0)
Platelets: 229 10*3/uL (ref 150–400)
RBC: 3.72 MIL/uL — ABNORMAL LOW (ref 3.87–5.11)
RDW: 14.7 % (ref 11.5–15.5)
WBC: 8.3 10*3/uL (ref 4.0–10.5)

## 2011-08-03 MED ORDER — LORAZEPAM 0.5 MG PO TABS
0.5000 mg | ORAL_TABLET | Freq: Every day | ORAL | Status: DC
  Administered 2011-08-03 – 2011-08-05 (×3): 0.5 mg via ORAL
  Filled 2011-08-03 (×3): qty 1

## 2011-08-03 MED ORDER — ZOLPIDEM TARTRATE 5 MG PO TABS
5.0000 mg | ORAL_TABLET | Freq: Every evening | ORAL | Status: DC | PRN
  Administered 2011-08-05 – 2011-08-07 (×3): 5 mg via ORAL
  Filled 2011-08-03 (×3): qty 1

## 2011-08-03 NOTE — Clinical Social Work Placement (Signed)
     Clinical Social Work Department CLINICAL SOCIAL WORK PLACEMENT NOTE 08/03/2011  Patient:  [name], [name]  Account Number:  [id number] Admit date:  08/02/2011  Clinical Social Worker:  Doree Albee  Date/time:  08/03/2011 04:30 PM  Clinical Social Work is seeking post-discharge placement for this patient at the following level of care:   SKILLED NURSING   (*CSW will update this form in Epic as items are completed)   08/03/2011  Patient/family provided with [facility] Department of Clinical Social Works list of facilities offering this level of care within the geographic area requested by the patient (or if unable, by the patients family).  08/03/2011  Patient/family informed of their freedom to choose among providers that offer the needed level of care, that participate in Medicare, Medicaid or managed care program needed by the patient, have an available bed and are willing to accept the patient.  08/03/2011  Patient/family informed of [facility] ownership interest in [facility], as well as of the fact that they are under no obligation to receive care at this facility.  PASARR submitted to EDS on 08/03/2011 PASARR number received from EDS on   FL2 transmitted to all facilities in geographic area requested by pt/family on  08/03/2011 FL2 transmitted to all facilities within larger geographic area on   Patient informed that his/her managed care company has contracts with or will negotiate with  certain facilities, including the following:     Patient/family informed of bed offers received:   Patient chooses bed at  Physician recommends and patient chooses bed at    Patient to be transferred to  on   Patient to be transferred to facility by   The following physician request were entered in Epic:   Additional Comments:

## 2011-08-03 NOTE — Clinical Social Work Psychosocial (Signed)
     Clinical Social Work Department BRIEF PSYCHOSOCIAL ASSESSMENT 08/03/2011  Patient:  [name], [name]     Account Number:  [id number]     Admit date:  08/02/2011  Clinical Social Worker:  Doree Albee  Date/Time:  08/03/2011 04:00 PM  Referred by:  RN  Date Referred:  08/02/2011 Referred for  SNF Placement   Other Referral:   Interview type:  Patient Other interview type:   and patient daughter at bedside    PSYCHOSOCIAL DATA Living Status:  FACILITY Admitted from facility:  [facility] Level of care:  Independent Living Primary support name:  [name] Primary support relationship to patient:  CHILD, ADULT Degree of support available:   strong    CURRENT CONCERNS Current Concerns  Post-Acute Placement   Other Concerns:    SOCIAL WORK ASSESSMENT / PLAN CSW met with pt and pt daughter [name] at pt bedside. CSW introduced self and csw role. Pt, pt dtr, and csw discussed pt current living environment and pt discharge plans.    Pt shared she is a resident at [facility] in Independent Living. Pt and pt daughter are interested in short term rehab at skilled nursing as recommended by physical therapy. Pt shared she has completed short term rehab previouslly in other states where she lived.    CSW, Pt, and Pt dtr discussed pt medications including cymbalta and ativan. Dr. Sherri Rad at [facility] prescibed these medications for pt peripheral neuropathy and to help pt sleep at night.    Pt and pt daughter are open to short term rehab in [place] preferring [facility]. CSW will initate snf placement. Please see placement note for placement progress.   Assessment/plan status:  Psychosocial Support/Ongoing Assessment of Needs Other assessment/ plan:   and discharge planning   Information/referral to community resources:   Skilled Nursing facility    PATIENTS/FAMILYS RESPONSE TO PLAN OF CARE: Pt and pt daughter thanked csw for concern and  support. Pt is very motivated to participate in short term rehab and return to her independent living at [facility].

## 2011-08-03 NOTE — Evaluation (Signed)
Physical Therapy Evaluation Patient Details Name: [name] MRN: [id number] DOB: 15-Dec-1919 Today's Date: 08/03/2011 Time: 4098-1191 PT Time Calculation (min): 26 min  PT Assessment / Plan / Recommendation Clinical Impression  Patient presents with fall due to syncope at home. She is having right sided pain as a consequence of the fall that is limiting her functional abilities. I am recommending SNF so that patient can return to her indep. living facility in the future once she has increased independence.     PT Assessment  Patient needs continued PT services    Follow Up Recommendations  Skilled nursing facility    Barriers to Discharge  None      Equipment Recommendations  Defer to next venue    Recommendations for Other Services  None   Frequency Min 3X/week    Precautions / Restrictions Precautions Precautions: Fall   Pertinent Vitals/Pain VSS/ Pain of right side of body post fall.      Mobility  Bed Mobility Bed Mobility: Supine to Sit;Sitting - Scoot to Edge of Bed Supine to Sit: 4: Min assist;HOB elevated;With rails Sitting - Scoot to Edge of Bed: 2: Max assist Details for Bed Mobility Assistance: Patient with difficulty elevating trunk and scooting secondary to limited right UE use due to pain. Transfers Transfers: Sit to Stand;Stand to Sit;Stand Pivot Transfers Sit to Stand: 3: Mod assist;With upper extremity assist;From bed Stand to Sit: 3: Mod assist;With upper extremity assist;To chair/3-in-1 Stand Pivot Transfers: 4: Min assist Details for Transfer Assistance: Patient fearful with standing. Poor judgment as prematurely reaches for chair arm rest. Difficulty initiating stand secondary to right arm pain. Needs max. encouragement for safe stepping until she reaches the chair.      PT Diagnosis: Difficulty walking;Generalized weakness;Acute pain  PT Problem List: Decreased strength;Decreased activity tolerance;Decreased balance;Decreased mobility;Pain PT  Treatment Interventions: DME instruction;Gait training;Functional mobility training;Therapeutic activities;Therapeutic exercise;Balance training;Patient/family education   PT Goals Acute Rehab PT Goals PT Goal Formulation: With patient Time For Goal Achievement: 08/10/11 Potential to Achieve Goals: Good Pt will go Supine/Side to Sit: with supervision PT Goal: Supine/Side to Sit - Progress: Goal set today Pt will go Sit to Supine/Side: with supervision PT Goal: Sit to Supine/Side - Progress: Goal set today Pt will go Sit to Stand: with supervision;with upper extremity assist PT Goal: Sit to Stand - Progress: Goal set today Pt will go Stand to Sit: with supervision;with upper extremity assist PT Goal: Stand to Sit - Progress: Goal set today Pt will Transfer Bed to Chair/Chair to Bed: with supervision PT Transfer Goal: Bed to Chair/Chair to Bed - Progress: Goal set today Pt will Ambulate: 16 - 50 feet;with supervision;with least restrictive assistive device PT Goal: Ambulate - Progress: Goal set today  Visit Information  Last PT Received On: 08/03/11 Assistance Needed: +1    Subjective Data  Subjective: Patient reports that she can not move her right arm well or use her right hand to push herself up due to soreness from her fall. Patient Stated Goal: Rehab pre-home to ger stronger.   Prior Functioning  Home Living Lives With: Alone Available Help at Discharge: Available PRN/intermittently;Family Type of Home: Independent living facility Home Access: Elevator Home Layout: One level (Lives on second floor) Bathroom Shower/Tub: Walk-in Soil scientist Toilet: Standard Home Adaptive Equipment: Built-in shower seat;Grab bars in shower;Raised toilet seat with rails;Walker - rolling Prior Function Level of Independence: Needs assistance Needs Assistance: Bathing;Light Housekeeping;Meal Prep Bath: Moderate (Shower every other day with assistance) Meal Prep: Total (has  food at dining  room except coffee and juice at breakfas) Light Housekeeping: Total Vocation: Retired Musician: Clinical cytogeneticist  Overall Cognitive Status: History of cognitive impairments - at baseline Arousal/Alertness: Awake/alert Orientation Level: Appears intact for tasks assessed Behavior During Session: [facility] for tasks performed Cognition - Other Comments: Repeats the same information.    Extremity/Trunk Assessment Right Lower Extremity Assessment RLE ROM/Strength/Tone: Deficits RLE ROM/Strength/Tone Deficits: Grossly 4/5. Limitations of foot/ankle secondary to recent treatment on old wound.  RLE Coordination: WFL - gross/fine motor Left Lower Extremity Assessment LLE ROM/Strength/Tone: Deficits LLE ROM/Strength/Tone Deficits: Grossly 4/5 LLE Coordination: WFL - gross/fine motor Trunk Assessment Trunk Assessment: Kyphotic   Balance Balance Balance Assessed: Yes Static Sitting Balance Static Sitting - Balance Support: No upper extremity supported;Feet supported Static Sitting - Level of Assistance: 7: Independent Static Sitting - Comment/# of Minutes: Sat at edge approx 5 minutes secondary to initial dizziness.   End of Session PT - End of Session Activity Tolerance: Patient limited by pain;Patient limited by fatigue Patient left: in chair;with call bell/phone within reach Nurse Communication: Mobility status  GP Functional Assessment Tool Used: Clinical reasoning/judgement Functional Limitation: Mobility: Walking and moving around Mobility: Walking and Moving Around Current Status (N5621): At least 40 percent but less than 60 percent impaired, limited or restricted Mobility: Walking and Moving Around Goal Status [phone]): At least 40 percent but less than 60 percent impaired, limited or restricted   Edwyna Perfect, PT  Pager [phone]  08/03/2011, 8:58 AM

## 2011-08-03 NOTE — Care Management Note (Signed)
    Page 1 of 1   08/03/2011     11:10:44 AM   CARE MANAGEMENT NOTE 08/03/2011  Patient:  [name], [name]   Account Number:  [id number]  Date Initiated:  08/03/2011  Documentation initiated by:  GRAVES-BIGELOW,Nola Botkins  Subjective/Objective Assessment:   Pt admited with syncope. Pt is from [facility]. Plan for SNF at d/c.     Action/Plan:   CSW is aware of disposition plans.   Anticipated DC Date:  08/05/2011   Anticipated DC Plan:  SKILLED NURSING FACILITY  In-house referral  Clinical Social Worker      DC Planning Services  CM consult      Choice offered to / List presented to:             Status of service:  Completed, signed off Medicare Important Message given?   (If response is "NO", the following Medicare IM given date fields will be blank) Date Medicare IM given:   Date Additional Medicare IM given:    Discharge Disposition:  SKILLED NURSING FACILITY  Per UR Regulation:  Reviewed for med. necessity/level of care/duration of stay  If discussed at Long Length of Stay Meetings, dates discussed:    Comments:

## 2011-08-03 NOTE — H&P (Signed)
Internal Medicine Attending Admission Note Date: 08/03/2011  Patient name: [name] Medical record number: [id number] Date of birth: [birth year] Age: 78 y.o. Gender: female  I saw and evaluated the patient. I reviewed the resident's note and I agree with the resident's findings and plan as documented in the resident's note.  Chief Complaint(s): Syncope  History - key components related to admission:  [name] is an 78 year old woman with a history of complete heart block requiring a permanent pacemaker, peripheral neuropathy, and diffuse degenerative joint changes, who presents with syncope. Over the past 10 days [name] has noted greater fatigue than baseline. Apparently her nighttime lorazepam was cut from 2 mg to 1 mg because of this generalized tiredness. Her daughter also reminded [name] that she has had intermittent sweats over the last couple of months. These were not associated with palpitations, flushing, diarrhea, shortness of breath, chest pain, abdominal pain, or nausea. Other than those subacute symptoms [name] was in her usual state of health until the night before admission when she got up in her living room after reading a book to go to the bathroom. She uses a walker and ambulated to her bedroom at which point she lost consciousness. When she awoke she was unable to contact anyone secondary to generalized weakness. She is unable to get up at baseline because of her neuropathy and degenerative joint disease in the legs. Her daughter called the next morning and when the phone was not answered the daughter called the people at [facility] assisted living facility to check on her. At that point, she was found on the ground where she had spent the night. In the seconds preceding the syncopal event [name] denied headaches, visual changes, dizziness, vertigo, numbness, weakness, chest pain, shortness of breath, abdominal pain, or joint give way. She was not sure  how long she lost consciousness and the event was not witnessed. She lost bowel continence but denies any bladder incontinence. She has never had a similar event in the past. She was therefore brought to the emergency department for further evaluation and is admitted to the internal medicine teaching service. Other than some tenderness on the right side of the body where she presumably fell she is without other acute complaints.  While in the emergency department her pacemaker was interrogated. Presumably she had a run of atrial fibrillation just prior to her syncopal event although it apparently was not felt to be related to the syncopal event.  Physical Exam - key components related to admission:  Filed Vitals:   08/02/11 2326 08/03/11 0617 08/03/11 1300 08/03/11 1513  BP: 135/77 131/68 128/70 132/76  Pulse: 63 66 71 66  Temp: 98.1 F (36.7 C) 97.5 F (36.4 C) 98.2 F (36.8 C) 97.6 F (36.4 C)  TempSrc: Oral Oral Oral Oral  Resp: 18 17 18 16   Height: 5\' 7"  (1.702 m)     Weight: 193 lb 6.4 oz (87.726 kg)     SpO2: 96% 98% 94% 97%   Gen.: Well-developed, well-nourished, elderly woman lying comfortably in bed in no acute distress. She is alert and oriented x 3. She answers all questions appropriately although does repeat that her hand is hurting her and that is why she is using the ice several times during our 20 minute interview. Lungs: Clear to auscultation bilaterally without wheezes, rhonchi, or rales Heart: Regular rate and irregularly irregular rhythm without rubs or gallops. There is a 2/6 systolic murmur at the left lower sternal border  and apex of the heart.  Abdomen: Soft, nontender, active bowel sounds.  Extremities: Without edema. Right trochanteric tenderness. Ecchymosis on the dose dorsal aspect of the right hand particularly on the pointer finger. Neuro: Strength 5/5 throughout all extremities.  Lab results:  Basic Metabolic Panel:  Basename 08/03/11 0620 08/02/11 1106    NA 136 140  K 3.8 4.6  CL 100 104  CO2 26 27  GLUCOSE 95 115*  BUN 16 16  CREATININE 0.64 0.75  CALCIUM 9.2 9.5  MG -- --  PHOS -- --   CBC:  Basename 08/03/11 0620 08/02/11 1106  WBC 8.3 13.4*  NEUTROABS -- --  HGB 11.9* 12.8  HCT 35.0* 37.0  MCV 94.1 93.4  PLT 229 250   Cardiac Enzymes:  Basename 08/03/11 0620 08/02/11 2323 08/02/11 2100 08/02/11 1106  CKTOTAL 681* 874* 794* --  CKMB 9.3* 13.0* -- --  CKMBINDEX -- -- -- --  TROPONINI <0.30 <0.30 -- <0.30   Urinalysis:  15 ketones, negative nitrite, moderate leukocytes, 11-20 white blood cells per high-power field, 0-2 red blood cells per high-power field.  Imaging results:  Dg Chest 2 View  08/02/2011  *RADIOLOGY REPORT*  Clinical Data: Status post fall.  Pain.  CHEST - 2 VIEW  Comparison: None.  Findings: Pacing device is in place.  Heart size is upper normal but there is no pulmonary edema.  Lungs are clear.  No pneumothorax or pleural fluid.  Degenerative change is present about the shoulders.  Both humeral heads are high-riding compatible with chronic rotator cuff tears.  There is an inferior endplate compression fracture at the thoracolumbar junction, likely T12. The fracture appears remote but cannot be definitively characterized.  IMPRESSION:  1.  No acute cardiopulmonary disease. 2.  T12 compression fracture appears remote but cannot be definitively characterized.  Original Report Authenticated By: Bernadene Bell. Maricela Curet, M.D.   Dg Shoulder Right  08/02/2011  *RADIOLOGY REPORT*  Clinical Data: 78 year old female with fall and pain.  RIGHT SHOULDER - 2+ VIEW  Comparison: Right humerus films 06/15/2009.  Findings: No glenohumeral joint dislocation.  Superior subluxation of the humeral head plus osteophytosis, subchondral sclerosis, and also degenerative spurring at the right acromioclavicular joint. The proximal right humerus appears intact. Right clavicle and scapula appear intact. Visualized right ribs and lung  parenchyma are within normal limits.  IMPRESSION: Degenerative changes but no acute fracture or dislocation identified at the right shoulder.  Original Report Authenticated By: Harley Hallmark, M.D.   Dg Hip Complete Right  08/03/2011  *RADIOLOGY REPORT*  Clinical Data: Larey Seat 2 days ago with right hip pain  RIGHT HIP - COMPLETE 2+ VIEW  Comparison: None.  Findings: The bones are diffusely osteopenic.  Hardware for prior fixation of a mid right femoral fracture is present.  No acute fracture is seen.  There is moderate degenerative joint disease of the hips right greater than left.  The pelvic rami are intact.  The SI joints appear normal.  There are degenerative changes in the lower lumbar spine.  IMPRESSION: No acute fracture.  Degenerative change in the hips, right greater than left.  Osteopenia.  Original Report Authenticated By: Juline Patch, M.D.   Dg Hand Complete Right  08/02/2011  *RADIOLOGY REPORT*  Clinical Data: Status post fall.  Pain and swelling about the thumb.  RIGHT HAND - COMPLETE 3+ VIEW  Comparison: None.  Findings: No fracture or dislocation is identified.  The patient has marked multi focal osteoarthritis.  Degenerative change appears worst  at the base of the thumb.  Soft tissues are unremarkable.  IMPRESSION: No acute finding.  Extensive degenerative disease.  Original Report Authenticated By: Bernadene Bell. Maricela Curet, M.D.   Other results:  EKG: Atrial fibrillation at 81 beats per minute, left axis deviation, left bundle branch block, ST changes consistent with the left bundle branch block. The prior tracing was 100% ventricularly paced.  Assessment & Plan by Problem:  Ms. Gallant is an 78 year old woman with a history of complete heart block status post pacemaker placement, degenerative joint disease, and peripheral neuropathy who had a syncopal event on the night before admission. There were no preceding symptoms or signs. She has noted more generalized fatigue over the last 10  days or so and has had a couple month history of sweats. She is ruled out for myocardial infarction and her pacemaker was interrogated and felt to be working properly. A central nervous system cause for her syncope also is not obvious based on workup to date. With her preceding symptoms of fatigue my concern is the syncopal episode may have been related to the benzodiazepines or trazodone she was receiving for sleep. This is a diagnosis of exclusion but she may tolerate a different regimen given the problem with insomnia for Ms. Callas is initiation of sleep rather than maintenance of sleep.  1) Syncope: We'll continue with telemetry monitoring overnight to assess for any unusual arrhythmias. We will continue the duloxetine and gabapentin for her peripheral neuropathy and a very rare Percocet she takes for pain. We will discontinue the trazodone and wean the lorazepam further to 0.5 mg every night for 5 nights followed by 0.5 mg every other night for an additional 3 doses prior to completely weaning off the benzodiazepine. In its place we will start Ambien 5 mg by mouth every night as needed for insomnia. It is hoped that this drug will wear off prior to the morning when she awakes. If she continues to do well without other symptoms we will limit our evaluation to that noted above.  2) Peripheral neuropathy and gait instability: Physical Therapy has assessed Ms. Ridolfi and is recommending a skilled every nursing facility for rehabilitation prior to returning to her assisted living facility. Ms. Menge is interested in such and we will work to try to get her placed. In the meantime we will continue the Cymbalta and gabapentin for her peripheral neuropathy.  3) Urinary tract infection: We will treat empirically with ceftriaxone and if the urine culture returned with a specific bug we will focus our coverage to that organism. It is possible that the urinary tract infection may have led to some difficulties  and generalized weakness that she has been experiencing. Apparently she has a previous history of a urinary tract infection that presented as acute mental status change.  4) Right hip pain: We will obtain a right hip x-ray to make sure she has not fractured her hip given the painful trochanteric area on palpation. Other signs and symptoms are not suggestive of a hip fracture.  5) Disposition: Ms. Dull will be transferred to a skilled nursing facility for short-term rehabilitation prior to returning home.

## 2011-08-03 NOTE — Progress Notes (Signed)
Medical Student Daily Progress Note  Subjective: [name] is an 78 yo female with PMH significant for complete heart block s/p pacemaker and OA who presented from [facility] after a fall that she believes is 2/2 syncope.  She was admitted to the floor because she was unstable/dizzy when attempting to walk in the ED.  There were no acute events overnight.  Today, she complains of continued pain on her right side that is mainly in her shoulder and hand.  She has been up to walk with assistance, but feels that her legs are still "wobbly."  We discussed her polypharmacy today, but she is unable to describe why she is one some of her medications (notably trazadone, and cephalexin).  She says that her physician at [facility], Dr. Sherri Rad, recently decreased the dosage of the lorazepam that she uses for sleep.  Otherwise, there have been no changes to her medications recently.    Objective: Vital signs in last 24 hours: Filed Vitals:   08/02/11 1805 08/02/11 2304 08/02/11 2326 08/03/11 0617  BP: 133/58 113/67 135/77 131/68  Pulse: 73 93 63 66  Temp:  98.6 F (37 C) 98.1 F (36.7 C) 97.5 F (36.4 C)  TempSrc:  Oral Oral Oral  Resp: 16 18 18 17   Height:   5\' 7"  (1.702 m)   Weight:   87.726 kg (193 lb 6.4 oz)   SpO2: 90% 96% 96% 98%   Weight change:   Intake/Output Summary (Last 24 hours) at 08/03/11 1148 Last data filed at 08/03/11 0900  Gross per 24 hour  Intake    360 ml  Output      0 ml  Net    360 ml   Physical Exam: General appearance: alert, cooperative and no distress Head: Normocephalic, without obvious abnormality, atraumatic Back: Range of motion is limited by shoulder pain. Lungs: clear to auscultation bilaterally Heart: regular rate and rhythm and heart sounds slightly muffled Extremities: no edema note, no redness or tenderness in the calves or thighs and reports soreness on lateral right thigh/hip, but it is not tender to palpation.  Pain also present on right  arm/hand/shoulder.   MSK: Shoulder ROM is limited by pain. Decreased strength in legs (4/5).  Normal ROM in hands.  Notable swelling of right pointer finger that restricts ROM of that digit. Pulses: 2+ and symmetric Skin: some ecchymosis noted on right forearm and hand. Neurologic: Grossly normal, A&Ox3 Lab Results: Basic Metabolic Panel:  Lab 08/03/11 6073 08/02/11 1106  NA 136 140  K 3.8 4.6  CL 100 104  CO2 26 27  GLUCOSE 95 115*  BUN 16 16  CREATININE 0.64 0.75  CALCIUM 9.2 9.5  MG -- --  PHOS -- --   CBC:  Lab 08/03/11 0620 08/02/11 1106  WBC 8.3 13.4*  NEUTROABS -- --  HGB 11.9* 12.8  HCT 35.0* 37.0  MCV 94.1 93.4  PLT 229 250   Cardiac Enzymes:  Lab 08/03/11 0620 08/02/11 2323 08/02/11 2100 08/02/11 1106  CKTOTAL 681* 874* 794* --  CKMB 9.3* 13.0* -- --  CKMBINDEX -- -- -- --  TROPONINI <0.30 <0.30 -- <0.30   Urinalysis:  Lab 08/02/11 1254  COLORURINE YELLOW  LABSPEC 1.015  PHURINE 6.0  GLUCOSEU NEGATIVE  HGBUR NEGATIVE  BILIRUBINUR NEGATIVE  KETONESUR 15*  PROTEINUR NEGATIVE  UROBILINOGEN 0.2  NITRITE NEGATIVE  LEUKOCYTESUR MODERATE*   Studies/Results: Dg Chest 2 View  08/02/2011  *RADIOLOGY REPORT*  Clinical Data: Status post fall.  Pain.  CHEST - 2 VIEW  Comparison: None.  Findings: Pacing device is in place.  Heart size is upper normal but there is no pulmonary edema.  Lungs are clear.  No pneumothorax or pleural fluid.  Degenerative change is present about the shoulders.  Both humeral heads are high-riding compatible with chronic rotator cuff tears.  There is an inferior endplate compression fracture at the thoracolumbar junction, likely T12. The fracture appears remote but cannot be definitively characterized.  IMPRESSION:  1.  No acute cardiopulmonary disease. 2.  T12 compression fracture appears remote but cannot be definitively characterized.  Original Report Authenticated By: Bernadene Bell. Maricela Curet, M.D.   Dg Shoulder Right  08/02/2011   *RADIOLOGY REPORT*  Clinical Data: 78 year old female with fall and pain.  RIGHT SHOULDER - 2+ VIEW  Comparison: Right humerus films 06/15/2009.  Findings: No glenohumeral joint dislocation.  Superior subluxation of the humeral head plus osteophytosis, subchondral sclerosis, and also degenerative spurring at the right acromioclavicular joint. The proximal right humerus appears intact. Right clavicle and scapula appear intact. Visualized right ribs and lung parenchyma are within normal limits.  IMPRESSION: Degenerative changes but no acute fracture or dislocation identified at the right shoulder.  Original Report Authenticated By: Harley Hallmark, M.D.   Dg Hand Complete Right  08/02/2011  *RADIOLOGY REPORT*  Clinical Data: Status post fall.  Pain and swelling about the thumb.  RIGHT HAND - COMPLETE 3+ VIEW  Comparison: None.  Findings: No fracture or dislocation is identified.  The patient has marked multi focal osteoarthritis.  Degenerative change appears worst at the base of the thumb.  Soft tissues are unremarkable.  IMPRESSION: No acute finding.  Extensive degenerative disease.  Original Report Authenticated By: Bernadene Bell. Maricela Curet, M.D.   Medications: I have reviewed the patient's current medications. Scheduled Meds:   . cefTRIAXone (ROCEPHIN)  IV  1 g Intravenous Once  . cefTRIAXone (ROCEPHIN)  IV  1 g Intravenous Q24H  . DULoxetine  60 mg Oral BID  . enoxaparin (LOVENOX) injection  40 mg Subcutaneous Daily  . gabapentin  600 mg Oral Q8H  .  morphine injection  4 mg Intravenous Once  . sodium chloride  1,000 mL Intravenous Once   Continuous Infusions:  PRN Meds:.acetaminophen, acetaminophen, HYDROcodone-acetaminophen, traZODone Assessment/Plan: Principal Problem:  *UTI (lower urinary tract infection) Active Problems:  HEART BLOCK  Syncope  1. Syncopal episode with fall: Pt continues to have pain on her right side, but xrays do not show any areas that were concerning for  fractures/breaks.  At this time, the most likely causes for her fall are polypharmacy and/or UTI.   We have ruled out a cardiac cause with 3 negative troponins.  Cardiology has seen and evaluated her pacemaker, and found no pacemaker dysfunction. The CK total values were elevated.  She says that she did not take her bedtime medications prior to this fall, but elderly patients are known to be much more sensitive to benzos.  The 2012 [place] list describes this effect and specifically lists lorazepam, one of her medications, as one to avoid in this population when possible.  We will hold this medication for now until we are able to speak with her PCP.  It may be necessary to discontinue this on discharge to prevent future falls.    -Continue to monitor CK.  Her values are increased, and this may be indicative of rhabdomyolysis from her fall.  -Continue treatment with rocephin for probably UTI.   -Continue tele  -PT/OT evaluation  shows decreased strength, balance and mobility along with a generalized weakness and difficulty walking.  She will likely need to continue this treatment on discharge.  They have recommended discharge to SNF. -Rocephin for UTI  -Care manager for any D/C needs  -fall precautions   2. UTI - See above, rocephin.   3. Heart block - pacemaker in place and working appropriately and was interrogated in the ED.   4,  Previous skin graft: Pt was seen and evaluated by the wound consult team and per their note the area on her right foot only has some erythema and areas of peeling.  This does not require any dressing or topical treatment at this time.  We will continue to monitor it, and instructed the patient to let us know if it worsens.  Otherwise, she is instructed to follow-up with her podiatrist on discharge.    5. DVT ppx - lovenox [place] daily.    LOS: 1 day   This is a Psychologist, occupational Note.  The care of the patient was discussed with Dr. Josem Kaufmann and the assessment and plan formulated  with their assistance.  Please see their attached note for official documentation of the daily encounter.  Cashis Rill D 08/03/2011, 11:48 AM

## 2011-08-03 NOTE — Progress Notes (Signed)
UR Completed Janda Cargo Graves-Bigelow, RN,BSN [phone]  

## 2011-08-03 NOTE — Progress Notes (Signed)
  Patient Name: [name]      SUBJECTIVE: seen in ER for syncope, maybe 2/2 atrial arrhytmia with UTI and orthostatic intolerance  Weak but  O/w without complaint  Past Medical History  Diagnosis Date  . Complete heart block   . Visual problems     "very limited in right eye"  . Pacemaker     FOR COMPLETE HEART BLOCK....MEDTRONIC ADAPTA R01  . Peripheral neuropathy   . History of blood transfusion     "only w/operations"  . Arthritis     "all over kind of"  . Syncope and collapse 08/02/11    "first time ever"  . Dementia 08/02/11    Delene Loll; "forgetful, repeating"    PHYSICAL EXAM Filed Vitals:   08/02/11 1805 08/02/11 2304 08/02/11 2326 08/03/11 0617  BP: 133/58 113/67 135/77 131/68  Pulse: 73 93 63 66  Temp:  98.6 F (37 C) 98.1 F (36.7 C) 97.5 F (36.4 C)  TempSrc:  Oral Oral Oral  Resp: 16 18 18 17   Height:   5\' 7"  (1.702 m)   Weight:   193 lb 6.4 oz (87.726 kg)   SpO2: 90% 96% 96% 98%    Well developed and nourished in no acute distress HENT normal Neck supple   Clear Regular rate and rhythm, 2/6 m at papexAbd-soft with active BS No Clubbing cyanosis edema Skin-warm and dry A & Oriented  Grossly normal sensory and motor function  TELEMETRY: Reviewed telemetry pt in NSR    Intake/Output Summary (Last 24 hours) at 08/03/11 0905 Last data filed at 08/03/11 0900  Gross per 24 hour  Intake    360 ml  Output      0 ml  Net    360 ml    LABS: Basic Metabolic Panel:  Lab 08/03/11 1610 08/02/11 1106  NA 136 140  K 3.8 4.6  CL 100 104  CO2 26 27  GLUCOSE 95 115*  BUN 16 16  CREATININE 0.64 0.75  CALCIUM 9.2 9.5  MG -- --  PHOS -- --   Cardiac Enzymes:  Basename 08/03/11 0620 08/02/11 2323 08/02/11 2100 08/02/11 1106  CKTOTAL 681* 874* 794* --  CKMB 9.3* 13.0* -- --  CKMBINDEX -- -- -- --  TROPONINI <0.30 <0.30 -- <0.30   CBC:  Lab 08/03/11 0620 08/02/11 1106  WBC 8.3 13.4*  NEUTROABS -- --  HGB 11.9* 12.8  HCT 35.0*  37.0  MCV 94.1 93.4  PLT 229 250     ASSESSMENT AND PLAN:  Patient Active Hospital Problem List: UTI (lower urinary tract infection) (08/02/2011)   HEART BLOCK (12/28/2008)   Syncope (08/02/2011) possible 2/2 autonomic dysfunction 2/2atrial arrhythmia/UTI   Atrial fibrillation-resolved  Pacemaker _stable   Supportive care No specific cardiac recs   Signed, Sherryl Manges MD  08/03/2011

## 2011-08-03 NOTE — Progress Notes (Signed)
[name]'s assessment and plan were discussed in detail with [name].  Please see my H&P from today with the details of my evaluation and plan.

## 2011-08-03 NOTE — Consult Note (Signed)
WOC consult Note Reason for Consult:Consult requested for right foot.  Pt had a previous skin graft to anterior foot many years ago which has closed and healed, but has generalized erythremia and patchy areas of peeling skin.  No topical treatment indicated at this time.  Plantar foot has dry callous .2X.2X.1cm.  Pt states she is followed as outpatient by "a foot doctor" who trims the callous periodically but has not ordered any other topical treatment.  No odor, drainage, or open wound which would require a dressing at this time..  Pt can resume follow-up with podiatrist after discharge. Will not plan to follow further unless re-consulted.  [address] RN, MSN, [employer]  [phone]

## 2011-08-04 NOTE — Progress Notes (Signed)
Medical Student Daily Progress Note  Subjective: [name] is an 78 yo female with PMH significant for complete heart block s/p pacemaker and OA admitted after syncope. Yesterday, she had a hip x-ray for point  Tenderness of her right trochanter that showed no evidence of fracture.  She has not required any pain medications since admission and says that she only has pain when she moves around.  Her urine culture showed >100,000 colonies/ml of multiple bacteria.  She is ready to go to a SNF on discharge, and social work has placement for her tomorrow.  We also discontinued her trazadone and will ween her off of Lorazepam.      Objective: Vital signs in last 24 hours: Filed Vitals:   08/03/11 1300 08/03/11 1513 08/03/11 2128 08/04/11 0601  BP: 128/70 132/76 124/58 142/84  Pulse: 71 66 73 70  Temp: 98.2 F (36.8 C) 97.6 F (36.4 C) 98.2 F (36.8 C) 98.9 F (37.2 C)  TempSrc: Oral Oral Oral Oral  Resp: 18 16 18 18   Height:      Weight:      SpO2: 94% 97% 93% 92%   Weight change:   Intake/Output Summary (Last 24 hours) at 08/04/11 1109 Last data filed at 08/03/11 1700  Gross per 24 hour  Intake    480 ml  Output      0 ml  Net    480 ml   Physical Exam: General appearance: alert, cooperative and no distress Head: Normocephalic, without obvious abnormality, atraumatic Back: Range of motion is limited by shoulder pain. Lungs: clear to auscultation bilaterally Heart: regular rate and rhythm and heart sounds slightly muffled Extremities: no edema note, no redness or tenderness in the calves or thighs and reports soreness on lateral right thigh/hip, but it is not tender to palpation.  Pain also present on right arm/hand/shoulder.   MSK: Shoulder ROM is limited by pain. Decreased strength in legs (4/5).  Normal ROM in hands.  Notable swelling of right pointer finger that restricts ROM of that digit. Pulses: 2+ and symmetric Skin: some ecchymosis noted on right forearm and  hand. Neurologic: Grossly normal, A&Ox3 Lab Results: Basic Metabolic Panel:  Lab 08/03/11 4696 08/02/11 1106  NA 136 140  K 3.8 4.6  CL 100 104  CO2 26 27  GLUCOSE 95 115*  BUN 16 16  CREATININE 0.64 0.75  CALCIUM 9.2 9.5  MG -- --  PHOS -- --   CBC:  Lab 08/03/11 0620 08/02/11 1106  WBC 8.3 13.4*  NEUTROABS -- --  HGB 11.9* 12.8  HCT 35.0* 37.0  MCV 94.1 93.4  PLT 229 250   Cardiac Enzymes:  Lab 08/03/11 0620 08/02/11 2323 08/02/11 2100 08/02/11 1106  CKTOTAL 681* 874* 794* --  CKMB 9.3* 13.0* -- --  CKMBINDEX -- -- -- --  TROPONINI <0.30 <0.30 -- <0.30   Urinalysis:  Lab 08/02/11 1254  COLORURINE YELLOW  LABSPEC 1.015  PHURINE 6.0  GLUCOSEU NEGATIVE  HGBUR NEGATIVE  BILIRUBINUR NEGATIVE  KETONESUR 15*  PROTEINUR NEGATIVE  UROBILINOGEN 0.2  NITRITE NEGATIVE  LEUKOCYTESUR MODERATE*   Studies/Results: Dg Chest 2 View  08/02/2011  *RADIOLOGY REPORT*  Clinical Data: Status post fall.  Pain.  CHEST - 2 VIEW  Comparison: None.  Findings: Pacing device is in place.  Heart size is upper normal but there is no pulmonary edema.  Lungs are clear.  No pneumothorax or pleural fluid.  Degenerative change is present about the shoulders.  Both humeral heads are  high-riding compatible with chronic rotator cuff tears.  There is an inferior endplate compression fracture at the thoracolumbar junction, likely T12. The fracture appears remote but cannot be definitively characterized.  IMPRESSION:  1.  No acute cardiopulmonary disease. 2.  T12 compression fracture appears remote but cannot be definitively characterized.  Original Report Authenticated By: Bernadene Bell. Maricela Curet, M.D.   Dg Shoulder Right  08/02/2011  *RADIOLOGY REPORT*  Clinical Data: 78 year old female with fall and pain.  RIGHT SHOULDER - 2+ VIEW  Comparison: Right humerus films 06/15/2009.  Findings: No glenohumeral joint dislocation.  Superior subluxation of the humeral head plus osteophytosis, subchondral sclerosis,  and also degenerative spurring at the right acromioclavicular joint. The proximal right humerus appears intact. Right clavicle and scapula appear intact. Visualized right ribs and lung parenchyma are within normal limits.  IMPRESSION: Degenerative changes but no acute fracture or dislocation identified at the right shoulder.  Original Report Authenticated By: Harley Hallmark, M.D.   Dg Hip Complete Right  08/03/2011  *RADIOLOGY REPORT*  Clinical Data: Larey Seat 2 days ago with right hip pain  RIGHT HIP - COMPLETE 2+ VIEW  Comparison: None.  Findings: The bones are diffusely osteopenic.  Hardware for prior fixation of a mid right femoral fracture is present.  No acute fracture is seen.  There is moderate degenerative joint disease of the hips right greater than left.  The pelvic rami are intact.  The SI joints appear normal.  There are degenerative changes in the lower lumbar spine.  IMPRESSION: No acute fracture.  Degenerative change in the hips, right greater than left.  Osteopenia.  Original Report Authenticated By: Juline Patch, M.D.   Dg Hand Complete Right  08/02/2011  *RADIOLOGY REPORT*  Clinical Data: Status post fall.  Pain and swelling about the thumb.  RIGHT HAND - COMPLETE 3+ VIEW  Comparison: None.  Findings: No fracture or dislocation is identified.  The patient has marked multi focal osteoarthritis.  Degenerative change appears worst at the base of the thumb.  Soft tissues are unremarkable.  IMPRESSION: No acute finding.  Extensive degenerative disease.  Original Report Authenticated By: Bernadene Bell. Maricela Curet, M.D.   Medications: I have reviewed the patient's current medications. Scheduled Meds:    . cefTRIAXone (ROCEPHIN)  IV  1 g Intravenous Q24H  . DULoxetine  60 mg Oral BID  . enoxaparin (LOVENOX) injection  40 mg Subcutaneous Daily  . gabapentin  600 mg Oral Q8H  . LORazepam  0.5 mg Oral QHS   Continuous Infusions:  PRN Meds:.acetaminophen, acetaminophen, HYDROcodone-acetaminophen,  zolpidem, DISCONTD: traZODone Assessment/Plan: Principal Problem:  *UTI (lower urinary tract infection) Active Problems:  HEART BLOCK  Syncope  1. Syncopal episode with fall: Pt continues to have pain on her right side, but xrays do not show any areas that were concerning for fractures/breaks.  We believe that this syncope was caused by polypharmacy.  Therefore we have started to decreased the number of medications that she is on in hopes of preventing future falls.  On discharge, she will no longer be on trazadone, and we will slowly taper her nightly dose of lorazepam using the following schedule: 0.5mg  x 5 days (with the last dose on Monday, July 22nd) and then 0.5 mg every other day for an additional three doses (7/24, 7/26 and 7/28).  We will prescribe Ambien 5 mg prn at bedtime.   -D/C tele  -Permanently discontinue trazadone at bedtime -Slowly ween lorazepam at above schedule. -PT/OT evaluation shows decreased strength, balance and mobility  along with a generalized weakness and difficulty walking.  She will likely need to continue this treatment on discharge.  They have recommended discharge to SNF.  -fall precautions   2. UTI: Urine culture does not reveal evidence of a UTI.  This was likely a simple bacteruria.  There was question if an underlying infection might have been a cause of syncope, but we have ruled this out.  She has recevied 3 doses of rocephin, which would be sufficient coverage for a simple UTI in any case.   --Discontinue rocephin  3. Heart block - pacemaker in place and working appropriately and was interrogated in the ED. She should continue to follow up with her cardiologist on discharge.    4,  Previous skin graft: Pt was seen and evaluated by the wound consult team and per their note the area on her right foot only has some erythema and areas of peeling.  This does not require any dressing or topical treatment at this time.  Pt is instructed to follow-up with her  podiatrist on discharge.    5.  Discharge planning: Our social worker already has placement lined up for her to go to a SNF at discharge tomorrow.  She will go to [facility] for additional PT/OT that we believe is necessary for her generalized weakness and difficulty walking.  It is also better for her to have more supervision during her rehabilitation at this time because she is at an increased risk of falls.     DVT ppx - lovenox  daily.    LOS: 2 days   This is a Psychologist, occupational Note.  The care of the patient was discussed with Dr. Allena Katz and the assessment and plan formulated with their assistance.  Please see their attached note for official documentation of the daily encounter.  Elnita Maxwell 08/04/2011, 11:09 AM  Resident Co-sign Daily Note: I have seen the patient and reviewed the daily progress note by Alanson Puls MS 4 and discussed the care of the patient with them.  See below for documentation of my findings, assessment, and plans.  Subjective: Patient looks and feels better. Has no nausea vomiting, abdominal pain. Able to eat properly. Denies any chest pain, short of breath. Is amenable to nursing home placement.  Objective: Vital signs in last 24 hours: Filed Vitals:   08/03/11 1513 08/03/11 2128 08/04/11 0601 08/04/11 1300  BP: 132/76 124/58 142/84 101/62  Pulse: 66 73 70 80  Temp: 97.6 F (36.4 C) 98.2 F (36.8 C) 98.9 F (37.2 C) 98.5 F (36.9 C)  TempSrc: Oral Oral Oral Oral  Resp: 16 18 18 17   Height:      Weight:      SpO2: 97% 93% 92% 95%   Physical Exam: General: Sitting in chair comfortably HEENT: PERRL, EOMI, no scleral icterus Cardiac: RRR, no rubs, murmurs or gallops Pulm: clear to auscultation bilaterally, moving normal volumes of air Abd: soft, nontender, nondistended, BS present Ext: warm and well perfused, no pedal edema Neuro: alert and oriented X3, cranial nerves II-XII grossly intact  Lab Results: Reviewed and documented in  Electronic Record Micro Results: Reviewed and documented in Electronic Record Studies/Results: Reviewed and documented in Electronic Record Medications: I have reviewed the patient's current medications. Scheduled Meds:   . DULoxetine  60 mg Oral BID  . enoxaparin (LOVENOX) injection  40 mg Subcutaneous Daily  . gabapentin  600 mg Oral Q8H  . LORazepam  0.5 mg Oral QHS  . DISCONTD: cefTRIAXone (  ROCEPHIN)  IV  1 g Intravenous Q24H   Continuous Infusions:  PRN Meds:.acetaminophen, acetaminophen, HYDROcodone-acetaminophen, zolpidem Assessment/Plan: Principal Problem:  *UTI (lower urinary tract infection) Active Problems:  HEART BLOCK  Syncope  Patient had syncopal episode most likely from polypharmacy-unclear if bacteriuria complicated the process. - Appropriate Changes in medications made.  - Likely DC to nursing home tomorrow . - Discontinue ceftriaxone- is no evidence of UTI.    LOS: 2 days   Erinne Gillentine 08/04/2011, 5:09 PM

## 2011-08-04 NOTE — Progress Notes (Signed)
OT Discharge Note  Patient is being discharged from OT services secondary to:    Defer all OT needs to SNF   Pt to d/c SNF [facility] 08/05/11 per chart  Please see latest Therapy Progress Note for current level of functioning and progress toward goals.  Progress and discharge plan and discussed with patient/caregiver and they    Agree      Lucile Shutters   OTR/L Pager: [phone] Office: [phone] .'

## 2011-08-04 NOTE — Discharge Summary (Signed)
Internal Medicine Teaching [facility] Discharge Note  Name: Nykia Turko MRN: [id number] DOB: Jul 15, 1919 78 y.o.  Date of Admission: 08/02/2011  9:56 AM Date of Discharge: 08/04/2011 Attending Physician: Rocco Serene, MD  Discharge Diagnosis: Principal Problem:  *UTI (lower urinary tract infection) Active Problems:  HEART BLOCK  Syncope PolyPharmacy  Discharge Medications: Medication List  As of 08/04/2011  1:48 PM   TAKE these medications         cephALEXin 500 MG capsule   Commonly known as: KEFLEX   Take 1 capsule (500 mg total) by mouth 4 (four) times daily.         ASK your doctor about these medications         DULoxetine 60 MG capsule   Commonly known as: CYMBALTA   Take 60 mg by mouth 2 (two) times daily.      gabapentin 300 MG capsule   Commonly known as: NEURONTIN   Take 600 mg by mouth every 8 (eight) hours.      HYDROcodone-acetaminophen 5-325 MG per tablet   Commonly known as: NORCO/VICODIN   Take 1-2 tablets by mouth every 6 (six) hours as needed. For pain      LORazepam 2 MG tablet   Commonly known as: ATIVAN   Take 2 mg by mouth at bedtime.      potassium chloride 10 MEQ tablet   Commonly known as: K-DUR   Take 20 mEq by mouth daily.      traZODone 50 MG tablet   Commonly known as: DESYREL   Take 50 mg by mouth at bedtime as needed. For sleep            Disposition and follow-up:   Ms.Shaiann Bowdish was discharged from [facility] in stable condition condition.  She will be discharged to a short-term nursing facility for physical therapy.  The patient is instructed to call her PCP to schedule an appointment for follow-up within 2 weeks after D/C from SNF.  At the hospital follow up visit please address the changes made in her medications, how well the patient is adjusting to changes, and whether she has experienced any additional syncopal episodes or falls.  Also, address the swelling and tenderness that she continues to  have from her recent fall.    Follow-up Appointments:  Patient is instructed to follow up with her PCP, cardiologist and podiatrist on discharge.    Follow-up Appointments:    Consultations: Treatment Team:  Rounding Lbcardiology, MD  Procedures Performed:  Dg Chest 2 View  08/02/2011  *RADIOLOGY REPORT*  Clinical Data: Status post fall.  Pain.  CHEST - 2 VIEW  Comparison: None.  Findings: Pacing device is in place.  Heart size is upper normal but there is no pulmonary edema.  Lungs are clear.  No pneumothorax or pleural fluid.  Degenerative change is present about the shoulders.  Both humeral heads are high-riding compatible with chronic rotator cuff tears.  There is an inferior endplate compression fracture at the thoracolumbar junction, likely T12. The fracture appears remote but cannot be definitively characterized.  IMPRESSION:  1.  No acute cardiopulmonary disease. 2.  T12 compression fracture appears remote but cannot be definitively characterized.  Original Report Authenticated By: Bernadene Bell. Maricela Curet, M.D.   Dg Shoulder Right  08/02/2011  *RADIOLOGY REPORT*  Clinical Data: 78 year old female with fall and pain.  RIGHT SHOULDER - 2+ VIEW  Comparison: Right humerus films 06/15/2009.  Findings: No glenohumeral joint dislocation.  Superior subluxation  of the humeral head plus osteophytosis, subchondral sclerosis, and also degenerative spurring at the right acromioclavicular joint. The proximal right humerus appears intact. Right clavicle and scapula appear intact. Visualized right ribs and lung parenchyma are within normal limits.  IMPRESSION: Degenerative changes but no acute fracture or dislocation identified at the right shoulder.  Original Report Authenticated By: Harley Hallmark, M.D.   Dg Hip Complete Right  08/03/2011  *RADIOLOGY REPORT*  Clinical Data: Larey Seat 2 days ago with right hip pain  RIGHT HIP - COMPLETE 2+ VIEW  Comparison: None.  Findings: The bones are diffusely osteopenic.   Hardware for prior fixation of a mid right femoral fracture is present.  No acute fracture is seen.  There is moderate degenerative joint disease of the hips right greater than left.  The pelvic rami are intact.  The SI joints appear normal.  There are degenerative changes in the lower lumbar spine.  IMPRESSION: No acute fracture.  Degenerative change in the hips, right greater than left.  Osteopenia.  Original Report Authenticated By: Juline Patch, M.D.   Dg Hand Complete Right  08/02/2011  *RADIOLOGY REPORT*  Clinical Data: Status post fall.  Pain and swelling about the thumb.  RIGHT HAND - COMPLETE 3+ VIEW  Comparison: None.  Findings: No fracture or dislocation is identified.  The patient has marked multi focal osteoarthritis.  Degenerative change appears worst at the base of the thumb.  Soft tissues are unremarkable.  IMPRESSION: No acute finding.  Extensive degenerative disease.  Original Report Authenticated By: Bernadene Bell. Maricela Curet, M.D.   Admission HPI: Ms. Prout pmh complete heart block s/p pacemaker and OA presented from [facility] after being found on the floor. The history is provided by the patient and she states that after reading in bed she needed to go to the bathroom so while using her walker she had episode LOC when she came to she found herself on the floor with the walker on top of her. She is not physically able to get up from the ground and therefore waited for staff to find her in the AM. She denied any presyncopal symptoms before the event and had been in her usual good health before yesterday. She does live alone and uses the walker all the time at baseline. She has had no previous hx of falls and lives alone and able to perform all her ADLs. She has noticed some Right shoulder and right hand pain today. Her only other compliant was some pain on her right foot but that has been chronic s/p skin graft breakdown. She denied any SOB, CP, abdominal pain, n/v/d, dizziness,  fatigue, any recent sick contacts, or any new meds or medication changes.  Physical Exam:  Blood pressure 133/58, pulse 73, temperature 97.6 F (36.4 C), temperature source Oral, resp. rate 16, SpO2 90.00%.  General: resting in bed comfortably, very pleasant  HEENT: PERRL, EOMI, no scleral icterus  Cardiac: soft heart sounds, RRR, no rubs, murmurs or gallops  Pulm: CTAB, moving normal volumes of air, no crackles or wheezes  Abd: soft, nontender, nondistended, BS present, no organomegaly  Ext: warm and well perfused, no pedal edema, some ecchymosis on right forearm, right shoulder tender to palpation over bicep head, right hand slightly swollen and ecchymosis, R foot plantar surface area of erythema 2x2in square  MS: pt FROM of shoulder, elbow, and wrists bilaterally, able to raise legs with some decreased strength 3-4/5 bilaterally  Neuro: alert and oriented X3, cranial nerves II-XII  grossly intact  Lab results:  Basic Metabolic Panel:   Basename  08/02/11 1106   NA  140   K  4.6   CL  104   CO2  27   GLUCOSE  115*   BUN  16   CREATININE  0.75   CALCIUM  9.5   MG  --   PHOS  --    CBC:   Basename  08/02/11 1106   WBC  13.4*   NEUTROABS  --   HGB  12.8   HCT  37.0   MCV  93.4   PLT  250    Cardiac Enzymes:   Basename  08/02/11 1106   CKTOTAL  --   CKMB  --   CKMBINDEX  --   TROPONINI  <0.30    Urinalysis:   Basename  08/02/11 1254   COLORURINE  YELLOW   LABSPEC  1.015   PHURINE  6.0   GLUCOSEU  NEGATIVE   HGBUR  NEGATIVE   BILIRUBINUR  NEGATIVE   KETONESUR  15*   PROTEINUR  NEGATIVE   UROBILINOGEN  0.2   NITRITE  NEGATIVE   LEUKOCYTESUR  [facility] by problem list: Principal Problem:  *UTI (lower urinary tract infection) Active Problems:  HEART BLOCK  Syncope   1. Syncope with fall - The patient experienced a fall on Tuesday night at her living facility.  On admission, we ordered plain films to rule out fractures or breaks.   Films showed no fracture of shoulder, hand or hip.  She does continue to have pain in her right shoulder, wrist and finger with some accompanying bruising. Pt had good ROM of extremities as well with the exception of her shoulder; ROM in the right shoulder is limited by pain.  She was given Norco prn for pain during admission, but only required it on hospital day #1.  She was seen and evaluated by cardiology and pacemaker interrogated in ED; we also cycled cardiac enzymes, which were negative x3 and telemetry for 24 hours.  Total CK and CKMB were elevated on admission, but trended down over the course.  This is likely due to her fall, and inability to move overnight.  We believe that this fall was caused by polypharmacy (see below).  2.  UTI- An infectious etiology was also examined as a cause for the fall because of her previous history of urinary tract infection presenting as an acute mental status change. It was found that her UA showed evidence of bacteriuria.  The UA along with her reported symptoms of chills was concerning for UTI, and we started empiric treatment with ceftriaxone.  She completed a 3-day course and the antibiotic was discontinued after her culture showed mixed bacteria >100,000 cfu/mL.    3. Heart block - The patient's pacemaker is in place and working appropriately and was interrogated in the ED on admission.  Her EKG was compared to previous, and it was unchanged from previous admissions.  She is instructed to follow up with her cardiologist at [facility].    4. Polypharmacy- The patient's preceding symptoms of fatigue there was concern that the syncopal episode may have been related to the benzodiazepines or trazodone she was receiving for initiation of sleep, rather than maintenance of sleep.  Though she says that she did not take her bedtime medications prior to this episode, elderly patients are known to be much more sensitive to benzos.  We discontinued the trazadone  during her  admission, and we will prescribe Ambien as a replacement because the side effect profile is lower.  We will continue the duloxetine and gabapentin for her peripheral neuropathy and a very rare Percocet she takes for pain. We also started to wean the lorazepam that will continue at discharge.  The regimen for this will be: 0.5mg  x 5 nights (with the last dose on Monday, July 22nd) and then 0.5 mg every other night for an additional three doses (7/24, 7/26 and 7/28).  0.5 mg every night for 5 nights followed by 0.5 mg every other night for an additional 3 doses prior to completely weaning off the benzodiazepine.   5. Previous skin graft- Pt was seen and evaluated by the wound consult team and per their note the area on her right foot only has some erythema and areas of peeling.  This does not require any dressing or topical treatment at this time.  We will continue to monitor it, and instructed the patient to let us know if it worsens.  Otherwise, she is instructed to follow-up with her podiatrist on discharge.    6. Peripheral neuropathy/gait instability: Physical Therapy assessed Ms. Robben and recommended a skilled nursing facility for rehabilitation prior to returning to her assisted living facility. They observed difficulty walking, generalized weakness, along with decreased activity tolerance and balance.  In the meantime we will continue the Cymbalta and gabapentin for her peripheral neuropathy.  She has placement lined up for her to go to a SNF at discharge tomorrow.  She will go to [facility] for additional PT/OT that we believe is necessary for her generalized weakness and difficulty walking.  It is also better for her to have more supervision during her rehabilitation at this time because she is at an increased risk of falls.    Discharge Vitals:  BP 142/84  Pulse 70  Temp 98.9 F (37.2 C) (Oral)  Resp 18  Ht 5\' 7"  (1.702 m)  Wt 193 lb 6.4 oz (87.726 kg)  BMI 30.29 kg/m2  SpO2 92%  Discharge  Labs: No results found for this or any previous visit (from the past 24 hour(s)).  Signed: Tiffanni Scarfo 08/04/2011, 1:48 PM   Time Spent on Discharge: 35 minutes

## 2011-08-04 NOTE — Progress Notes (Signed)
Clinical Social Work-CSW provided family with bed offers-pt will d/c to [facility] on Saturday when bed available. Pt dtr completing paperwork today and CSW will fax pt d/c summary as soon as completed today. CSW will leave weekend report and f/u with d/c needs.  Jodean Lima, [phone]

## 2011-08-04 NOTE — Progress Notes (Signed)
Internal Medicine Attending  Date: 08/04/2011  Patient name: [name] Medical record number: [id number] Date of birth: 1919-04-19 Age: 78 y.o. Gender: female  I saw and evaluated the patient. I reviewed the interim history with [name] and [name] and the assessment and plan were formulated together.  [name] is slowly improving from her fall with decreased right sided pain.  No obvious cause for her syncope has been discovered but it is felt to be secondary to her peripheral neuropathy and gait instability, UTI, and polypharmacy.  Her UTI has been treated, we are trying to streamline her medical regimen and limit those medications that put her at risk for falls, and will transfer her to a SNF in the AM to work on strengthening of her lower extremities and gait training.  I agree with the housestaff's plans to discharge her to the SNF tomorrow.

## 2011-08-04 NOTE — Evaluation (Signed)
Occupational Therapy Evaluation Patient Details Name: [name] MRN: [id number] DOB: 08-Jun-1919 Today's Date: 08/04/2011 Time: 4098-1191 OT Time Calculation (min): 17 min  OT Assessment / Plan / Recommendation Clinical Impression  78 yo female admitted for  fall due to syncope at [facility]. Pt has aide at baseline to (A) with bathing. Pt could benefit from OT at SNF level. OT to sign off acutely    OT Assessment  All further OT needs can be met in the next venue of care    Follow Up Recommendations       Barriers to Discharge      Equipment Recommendations       Recommendations for Other Services    Frequency       Precautions / Restrictions Precautions Precautions: Fall   Pertinent Vitals/Pain Pain at Rt UE shoulder abduction Rt UE hand 2nd digit edema    ADL  Grooming: Performed;Wash/dry hands;Wash/dry face;Supervision/safety Where Assessed - Grooming: Unsupported standing Toilet Transfer: Performed;Moderate assistance Toilet Transfer Method: Sit to stand Toilet Transfer Equipment: Raised toilet seat with arms (or 3-in-1 over toilet) Toileting - Clothing Manipulation and Hygiene: Performed;Moderate assistance Where Assessed - Toileting Clothing Manipulation and Hygiene: Sit to stand from 3-in-1 or toilet Transfers/Ambulation Related to ADLs: pt with posterior lean during ambulation ADL Comments: Pt completed 3n1 transfer and then sink level task. Pt washing face and hands. Pt combing hair with Rt UE. Pt with decreased AROM shoulder flexion and abduction 45 degrees . Pt provided facilitation at elbow and AAROM to 90 degrees without pain.       OT Goals    Visit Information  Last OT Received On: 08/04/11 Assistance Needed: +2 (from low surfaces)    Subjective Data  Subjective: "read the [place] Wife" its a great book Patient Stated Goal: to go to [facility] and then return to [facility]   Prior Functioning  Vision/Perception  Home Living Lives  With: Alone Available Help at Discharge: Available PRN/intermittently;Family Type of Home: Independent living facility Home Access: Elevator Home Layout: One level Bathroom Shower/Tub: Health visitor: Standard Home Adaptive Equipment: Built-in shower seat;Grab bars in shower;Raised toilet seat with rails;Walker - rolling Prior Function Level of Independence: Needs assistance Needs Assistance: Bathing;Light Housekeeping;Meal Prep Bath: Moderate Meal Prep: Total Light Housekeeping: Total Driving: No Vocation: Retired Musician: HOH Dominant Hand: Right   Vision - Assessment Vision Assessment: Vision not tested  Cognition  Overall Cognitive Status: History of cognitive impairments - at baseline Arousal/Alertness: Awake/alert Orientation Level: Appears intact for tasks assessed Behavior During Session: [facility] for tasks performed    Extremity/Trunk Assessment Right Upper Extremity Assessment RUE ROM/Strength/Tone: Deficits;Due to pain RUE ROM/Strength/Tone Deficits: AROM 45 degrees. pain with abduction RUE Coordination: Deficits (edema 2nd digit) Left Upper Extremity Assessment LUE ROM/Strength/Tone: Within functional levels LUE Coordination: WFL - gross/fine motor Trunk Assessment Trunk Assessment: Kyphotic   Mobility Transfers Sit to Stand: 3: Mod assist;With upper extremity assist;From chair/3-in-1 Stand to Sit: 5: Supervision;To chair/3-in-1;With upper extremity assist   Exercise    Balance    End of Session OT - End of Session Activity Tolerance: Patient tolerated treatment well Patient left: in chair;with call bell/phone within reach Nurse Communication: Mobility status  GO     Harrel Carina [facility] 08/04/2011, 1:35 PM Pager: [phone]

## 2011-08-05 MED ORDER — BISACODYL 5 MG PO TBEC
5.0000 mg | DELAYED_RELEASE_TABLET | Freq: Every day | ORAL | Status: DC | PRN
  Administered 2011-08-05: 5 mg via ORAL
  Filled 2011-08-05: qty 1

## 2011-08-05 NOTE — Progress Notes (Signed)
No PAS[facility] # received as of today. PAS[facility] office will not re-open until Monday. Pt unable to d/c to SNF until PAS[facility] # issued. Anticipate d/c Monday. Weekday CSW will f/u. Dellie Burns, MSW, [place] [phone] (weekend)

## 2011-08-05 NOTE — Progress Notes (Signed)
Subjective: Admitted for syncope. Likely due to poly pharmacy versus orthostatic hypotension. Overall patient feels much better today- compared to admission. She says she is ready to go nursing home. Has no nausea, vomiting, abdominal pain, chest pain, short of breath. She is eating and drinking well. Objective: Vital signs in last 24 hours: Filed Vitals:   08/04/11 0601 08/04/11 1300 08/04/11 2100 08/05/11 0500  BP: 142/84 101/62 139/76 149/77  Pulse: 70 80 77 75  Temp: 98.9 F (37.2 C) 98.5 F (36.9 C) 98.2 F (36.8 C) 97.6 F (36.4 C)  TempSrc: Oral Oral Oral Oral  Resp: 18 17 18 18   Height:      Weight:      SpO2: 92% 95% 97% 96%   Weight change:   Intake/Output Summary (Last 24 hours) at 08/05/11 1004 Last data filed at 08/04/11 1200  Gross per 24 hour  Intake    360 ml  Output      0 ml  Net    360 ml   Physical Exam:  General: Sitting in chair comfortably  HEENT: PERRL, EOMI, no scleral icterus  Cardiac: RRR, no rubs, murmurs or gallops  Pulm: clear to auscultation bilaterally, moving normal volumes of air  Abd: soft, nontender, nondistended, BS present  Ext: warm and well perfused, no pedal edema. Ecchymosis present on arm. Neuro: alert and oriented X3, cranial nerves II-XII grossly intact   Lab Results: Basic Metabolic Panel:  Lab 08/03/11 1191 08/02/11 1106  NA 136 140  K 3.8 4.6  CL 100 104  CO2 26 27  GLUCOSE 95 115*  BUN 16 16  CREATININE 0.64 0.75  CALCIUM 9.2 9.5  MG -- --  PHOS -- --   CBC:  Lab 08/03/11 0620 08/02/11 1106  WBC 8.3 13.4*  NEUTROABS -- --  HGB 11.9* 12.8  HCT 35.0* 37.0  MCV 94.1 93.4  PLT 229 250   Cardiac Enzymes:  Lab 08/03/11 0620 08/02/11 2323 08/02/11 2100 08/02/11 1106  CKTOTAL 681* 874* 794* --  CKMB 9.3* 13.0* -- --  CKMBINDEX -- -- -- --  TROPONINI <0.30 <0.30 -- <0.30   Urinalysis:  Lab 08/02/11 1254  COLORURINE YELLOW  LABSPEC 1.015  PHURINE 6.0  GLUCOSEU NEGATIVE  HGBUR NEGATIVE  BILIRUBINUR  NEGATIVE  KETONESUR 15*  PROTEINUR NEGATIVE  UROBILINOGEN 0.2  NITRITE NEGATIVE  LEUKOCYTESUR MODERATE*    Micro Results: Recent Results (from the past 240 hour(s))  URINE CULTURE     Status: Normal   Collection Time   08/02/11 12:54 PM      Component Value Range Status Comment   Specimen Description URINE, RANDOM   Final    Special Requests ADDED [id number]   Final    Culture  Setup Time 08/02/2011 16:34   Final    Colony Count >=100,000 COLONIES/ML   Final    Culture     Final    Value: Multiple bacterial morphotypes present, none predominant. Suggest appropriate recollection if clinically indicated.   Report Status 08/03/2011 FINAL   Final    Studies/Results: Dg Hip Complete Right  08/03/2011  *RADIOLOGY REPORT*  Clinical Data: Larey Seat 2 days ago with right hip pain  RIGHT HIP - COMPLETE 2+ VIEW  Comparison: None.  Findings: The bones are diffusely osteopenic.  Hardware for prior fixation of a mid right femoral fracture is present.  No acute fracture is seen.  There is moderate degenerative joint disease of the hips right greater than left.  The pelvic rami are  intact.  The SI joints appear normal.  There are degenerative changes in the lower lumbar spine.  IMPRESSION: No acute fracture.  Degenerative change in the hips, right greater than left.  Osteopenia.  Original Report Authenticated By: Juline Patch, M.D.   Medications: I have reviewed the patient's current medications. Scheduled Meds:   . DULoxetine  60 mg Oral BID  . enoxaparin (LOVENOX) injection  40 mg Subcutaneous Daily  . gabapentin  600 mg Oral Q8H  . LORazepam  0.5 mg Oral QHS  . DISCONTD: cefTRIAXone (ROCEPHIN)  IV  1 g Intravenous Q24H   Continuous Infusions:  PRN Meds:.acetaminophen, acetaminophen, HYDROcodone-acetaminophen, zolpidem Assessment/Plan:  Principal Problem:  *UTI (lower urinary tract infection)  Active Problems:  HEART BLOCK  Syncope   Patient had syncopal episode most likely from  polypharmacy vs. Orthostatic. Patient does report feeling dizzy when she tries to get out of bed in morning. Was reading for many hours before she got up from the chair- when she passed out. - Appropriate Changes in medications made.  - Due to issues with nursing home placement- patient will stay in the hospital for the weekend. PASSAR number not available. - Discontinued ceftriaxone- is no evidence of UTI   LOS: 3 days   [name] 08/05/2011, 10:04 AM

## 2011-08-06 NOTE — Progress Notes (Signed)
Medical Student Daily Progress Note  Subjective: [name] is an 78 yo female with PMH significant for complete heart block s/p pacemaker and OA admitted after syncope. She has not complaints or acute events overnight.     Objective: Vital signs in last 24 hours: Filed Vitals:   08/04/11 2100 08/05/11 0500 08/05/11 2100 08/06/11 0500  BP: 139/76 149/77 156/83 124/79  Pulse: 77 75 72 66  Temp: 98.2 F (36.8 C) 97.6 F (36.4 C) 98.6 F (37 C) 98.6 F (37 C)  TempSrc: Oral Oral Oral Oral  Resp: 18 18 18 18   Height:      Weight:      SpO2: 97% 96% 96% 97%   Weight change:   Intake/Output Summary (Last 24 hours) at 08/06/11 0931 Last data filed at 08/05/11 1200  Gross per 24 hour  Intake    360 ml  Output    200 ml  Net    160 ml   Physical Exam: General appearance: alert, cooperative and no distress Head: Normocephalic, without obvious abnormality, atraumatic Lungs: clear to auscultation bilaterally Heart: regular rate and rhythm   Extremities: no edema note, no redness or tenderness in the calves or thighs and   MSK: Shoulder ROM is limited by pain. Normal ROM in hands.  Less swelling of right pointer finger; improved ROM of that digit. Pulses: 2+ and symmetric Neurologic: Grossly normal, A&Ox3  Lab Results: Basic Metabolic Panel:  Lab 08/03/11 1610 08/02/11 1106  NA 136 140  K 3.8 4.6  CL 100 104  CO2 26 27  GLUCOSE 95 115*  BUN 16 16  CREATININE 0.64 0.75  CALCIUM 9.2 9.5  MG -- --  PHOS -- --   CBC:  Lab 08/03/11 0620 08/02/11 1106  WBC 8.3 13.4*  NEUTROABS -- --  HGB 11.9* 12.8  HCT 35.0* 37.0  MCV 94.1 93.4  PLT 229 250   Cardiac Enzymes:  Lab 08/03/11 0620 08/02/11 2323 08/02/11 2100 08/02/11 1106  CKTOTAL 681* 874* 794* --  CKMB 9.3* 13.0* -- --  CKMBINDEX -- -- -- --  TROPONINI <0.30 <0.30 -- <0.30   Urinalysis:  Lab 08/02/11 1254  COLORURINE YELLOW  LABSPEC 1.015  PHURINE 6.0  GLUCOSEU NEGATIVE  HGBUR NEGATIVE  BILIRUBINUR  NEGATIVE  KETONESUR 15*  PROTEINUR NEGATIVE  UROBILINOGEN 0.2  NITRITE NEGATIVE  LEUKOCYTESUR MODERATE*   Studies/Results: No results found. Medications: I have reviewed the patient's current medications. Scheduled Meds:    . DULoxetine  60 mg Oral BID  . enoxaparin (LOVENOX) injection  40 mg Subcutaneous Daily  . gabapentin  600 mg Oral Q8H  . LORazepam  0.5 mg Oral QHS   Continuous Infusions:  PRN Meds:.acetaminophen, acetaminophen, bisacodyl, HYDROcodone-acetaminophen, zolpidem Assessment/Plan: Principal Problem:  *UTI (lower urinary tract infection) Active Problems:  HEART BLOCK  Syncope  1. Syncopal episode with fall: Pt has much less pain, and better ROM of movement in her right shoulder and hand.  She appears to be adjusting well to new medication regimen.  We believe that this syncope was caused by polypharmacy.  On discharge, she will no longer be on trazadone, and we will slowly taper her nightly dose of lorazepam using the following schedule: 0.5mg  x 5 days (with the last dose on Monday, July 22nd) and then 0.5 mg every other day for an additional three doses (7/24, 7/26 and 7/28).   -Permanently discontinue trazadone at bedtime -Continue slow decrease of lorazepam. -Continue Ambien 5 mg prn at bedtime.   -  fall precautions   2. Heart block - pacemaker in place and working appropriately and was interrogated in the ED. She should continue to follow up with her cardiologist on discharge.    3.  Discharge planning: Our social worker already has placement lined up for her to go to a SNF on Monday.  She will go to [facility] for additional PT/OT.   DVT ppx - lovenox [place] daily.    LOS: 4 days   This is a Psychologist, occupational Note.  The care of the patient was discussed with Dr. Allena Katz and the assessment and plan formulated with their assistance.  Please see their attached note for official documentation of the daily encounter.  [name] 08/06/2011, 9:31 AM  Resident  Co-sign Daily Note: I have seen the patient and reviewed the daily progress note by [name] Puls MS 4 and discussed the care of the patient with them.  See below for documentation of my findings, assessment, and plans.  Subjective: Patient feels better. No new complaints overnight. Does not have any nausea vomiting or abdominal pain. No chest pain or shortness of breath.  Objective: Vital signs in last 24 hours: Filed Vitals:   08/04/11 2100 08/05/11 0500 08/05/11 2100 08/06/11 0500  BP: 139/76 149/77 156/83 124/79  Pulse: 77 75 72 66  Temp: 98.2 F (36.8 C) 97.6 F (36.4 C) 98.6 F (37 C) 98.6 F (37 C)  TempSrc: Oral Oral Oral Oral  Resp: 18 18 18 18   Height:      Weight:      SpO2: 97% 96% 96% 97%   Physical Exam: General: Sitting in chair comfortably  HEENT: PERRL, EOMI, no scleral icterus  Cardiac: RRR, no rubs, murmurs or gallops  Pulm: clear to auscultation bilaterally, moving normal volumes of air  Abd: soft, nontender, nondistended, BS present  Ext: warm and well perfused, no pedal edema. Ecchymosis present on arm.  Neuro: alert and oriented X3, cranial nerves II-XII grossly intact  Lab Results: Reviewed and documented in Electronic Record Micro Results: Reviewed and documented in Electronic Record Studies/Results: Reviewed and documented in Electronic Record Medications: I have reviewed the patient's current medications. Scheduled Meds:   . DULoxetine  60 mg Oral BID  . enoxaparin (LOVENOX) injection  40 mg Subcutaneous Daily  . gabapentin  600 mg Oral Q8H  . LORazepam  0.5 mg Oral QHS   Continuous Infusions:  PRN Meds:.acetaminophen, acetaminophen, bisacodyl, HYDROcodone-acetaminophen, zolpidem Assessment/Plan: Principal Problem:  *UTI (lower urinary tract infection) Active Problems:  HEART BLOCK  Syncope Patient had syncopal episode most likely from polypharmacy vs. Orthostatic.  Patient does report feeling dizzy when she tries to get out of bed  in morning. Was reading for many hours before she got up from the chair- when she passed out.  - Appropriate Changes in medications made.  - Due to issues with nursing home placement- patient will stay in the hospital for the weekend. PASSAR number not available.  - Discontinued ceftriaxone- is no evidence of UTI - Likely discharge nursing home tomorrow     LOS: 4 days   Zakyah Yanes 08/06/2011, 1:31 PM

## 2011-08-07 MED ORDER — LORAZEPAM 0.5 MG PO TABS: 0.5000 mg | ORAL_TABLET | ORAL | Status: AC

## 2011-08-07 MED ORDER — GABAPENTIN 300 MG PO CAPS: 600.0000 mg | ORAL_CAPSULE | Freq: Three times a day (TID) | ORAL | Status: DC

## 2011-08-07 MED ORDER — DULOXETINE HCL 60 MG PO CPEP: 60.0000 mg | ORAL_CAPSULE | Freq: Two times a day (BID) | ORAL | Status: DC

## 2011-08-07 MED ORDER — ZOLPIDEM TARTRATE 5 MG PO TABS: 5.0000 mg | ORAL_TABLET | Freq: Every evening | ORAL | Status: DC | PRN

## 2011-08-07 MED ORDER — BISACODYL 5 MG PO TBEC: 5.0000 mg | DELAYED_RELEASE_TABLET | Freq: Every day | ORAL | Status: AC | PRN

## 2011-08-07 MED ORDER — ACETAMINOPHEN 325 MG PO TABS: 650.0000 mg | ORAL_TABLET | Freq: Four times a day (QID) | ORAL | Status: AC | PRN

## 2011-08-07 MED ORDER — ACETAMINOPHEN 650 MG RE SUPP: 650.0000 mg | Freq: Four times a day (QID) | RECTAL | Status: AC | PRN

## 2011-08-07 NOTE — Progress Notes (Signed)
D/c orders received;IV removed with gauze on; pt remains in stable condition, pt meds and instructions reviewed and given to pt; pt to be d/c to SNF via ambulance

## 2011-08-07 NOTE — Progress Notes (Signed)
Medical Student Daily Progress Note  Subjective: [name] is an 78 yo female with PMH significant for complete heart block s/p pacemaker and OA admitted after syncope. She has no complaints or acute events overnight.   She will be discharged to a SNF today.    Objective: Vital signs in last 24 hours: Filed Vitals:   08/06/11 0500 08/06/11 1400 08/06/11 2100 08/07/11 0500  BP: 124/79 119/63 125/77 147/68  Pulse: 66 78 63 60  Temp: 98.6 F (37 C) 98.1 F (36.7 C) 98 F (36.7 C) 98.4 F (36.9 C)  TempSrc: Oral Oral Oral Oral  Resp: 18 18 18 18   Height:      Weight:      SpO2: 97% 96%  93%   Weight change:  No intake or output data in the 24 hours ending 08/07/11 1130 Physical Exam: General appearance: alert, cooperative and no distress Head: Normocephalic, without obvious abnormality, atraumatic Lungs: clear to auscultation bilaterally Heart: regular rate and rhythm   Extremities: no edema note, no redness or tenderness in the calves or thighs and   MSK: Improved shoulder ROM is limited by pain. Improved ROM of right pointer finger. Pulses: 2+ and symmetric Neurologic: Grossly normal, A&Ox3  Lab Results: Basic Metabolic Panel:  Lab 08/03/11 9147 08/02/11 1106  NA 136 140  K 3.8 4.6  CL 100 104  CO2 26 27  GLUCOSE 95 115*  BUN 16 16  CREATININE 0.64 0.75  CALCIUM 9.2 9.5  MG -- --  PHOS -- --   CBC:  Lab 08/03/11 0620 08/02/11 1106  WBC 8.3 13.4*  NEUTROABS -- --  HGB 11.9* 12.8  HCT 35.0* 37.0  MCV 94.1 93.4  PLT 229 250   Cardiac Enzymes:  Lab 08/03/11 0620 08/02/11 2323 08/02/11 2100 08/02/11 1106  CKTOTAL 681* 874* 794* --  CKMB 9.3* 13.0* -- --  CKMBINDEX -- -- -- --  TROPONINI <0.30 <0.30 -- <0.30   Urinalysis:  Lab 08/02/11 1254  COLORURINE YELLOW  LABSPEC 1.015  PHURINE 6.0  GLUCOSEU NEGATIVE  HGBUR NEGATIVE  BILIRUBINUR NEGATIVE  KETONESUR 15*  PROTEINUR NEGATIVE  UROBILINOGEN 0.2  NITRITE NEGATIVE  LEUKOCYTESUR MODERATE*    Studies/Results: No results found. Medications: I have reviewed the patient's current medications. Scheduled Meds:    . DULoxetine  60 mg Oral BID  . enoxaparin (LOVENOX) injection  40 mg Subcutaneous Daily  . gabapentin  600 mg Oral Q8H  . LORazepam  0.5 mg Oral QHS   Continuous Infusions:  PRN Meds:.acetaminophen, acetaminophen, bisacodyl, HYDROcodone-acetaminophen, zolpidem Assessment/Plan: Principal Problem:  *UTI (lower urinary tract infection) Active Problems:  HEART BLOCK  Syncope  1. Syncopal episode with fall: Pt reports that she has no pain today and continues to have better ROM of movement in her right shoulder and hand.   We believe that this syncope was caused by polypharmacy.  She has now been off of trazadone for 3 days, and seems to be adjusting well.  We will continue the slow taper of her nightly dose of lorazepam using the following schedule: 0.5mg  x 5 days (with the last dose tonight Monday, July 22nd) and then 0.5 mg every other day for an additional three doses (7/24, 7/26 and 7/28).   -Permanently discontinue trazadone at bedtime -Continue slow decrease of lorazepam. -Continue Ambien 5 mg prn at bedtime.   -fall precautions   2. Heart block - pacemaker in place and working appropriately and was interrogated in the ED. She should continue to follow  up with her cardiologist on discharge.    3.  Discharge planning: We will discharge her to [facility] for additional PT/OT today.   This was confirmed, and she will be discharged at 1pm.    LOS: 5 days   This is a Psychologist, occupational Note.  The care of the patient was discussed with Dr. Allena Katz and the assessment and plan formulated with their assistance.  Please see their attached note for official documentation of the daily encounter.  Alanson Puls D 08/07/2011, 11:30 AM  Resident Co-sign Daily Note: I have seen the patient and reviewed the daily progress note by Alanson Puls MS 4 and discussed the care of  the patient with them.  See below for documentation of my findings, assessment, and plans.  Subjective: Pt looks and feels the same. No New complaints. Will D/C to [facility] today at 1 pm. Objective: Vital signs in last 24 hours: Filed Vitals:   08/06/11 0500 08/06/11 1400 08/06/11 2100 08/07/11 0500  BP: 124/79 119/63 125/77 147/68  Pulse: 66 78 63 60  Temp: 98.6 F (37 C) 98.1 F (36.7 C) 98 F (36.7 C) 98.4 F (36.9 C)  TempSrc: Oral Oral Oral Oral  Resp: 18 18 18 18   Height:      Weight:      SpO2: 97% 96%  93%   Physical Exam: General: Sitting in chair comfortably  HEENT: PERRL, EOMI, no scleral icterus  Cardiac: RRR, no rubs, murmurs or gallops  Pulm: clear to auscultation bilaterally, moving normal volumes of air  Abd: soft, nontender, nondistended, BS present  Ext: warm and well perfused, no pedal edema. Ecchymosis present on arm.  Neuro: alert and oriented X3, cranial nerves II-XII grossly intact  Lab Results: Reviewed and documented in Electronic Record Micro Results: Reviewed and documented in Electronic Record Studies/Results: Reviewed and documented in Electronic Record Medications: I have reviewed the patient's current medications. Scheduled Meds:   . DULoxetine  60 mg Oral BID  . enoxaparin (LOVENOX) injection  40 mg Subcutaneous Daily  . gabapentin  600 mg Oral Q8H  . LORazepam  0.5 mg Oral QHS   Continuous Infusions:  PRN Meds:.acetaminophen, acetaminophen, bisacodyl, HYDROcodone-acetaminophen, zolpidem Assessment/Plan: Principal Problem:  *UTI (lower urinary tract infection) Active Problems:  HEART BLOCK  Syncope  Patient had syncopal episode most likely from polypharmacy vs. Orthostatic.  Patient does report feeling dizzy when she tries to get out of bed in morning. Was reading for many hours before she got up from the chair- when she passed out.  - Appropriate Changes in medications made.  - Due to issues with nursing home placement-  patient will stay in the hospital for the weekend. PASSAR number not available.  - Discontinued ceftriaxone- is no evidence of UTI  - Discharge nursing home today. - Discontinued Trazodone and continued Ativan Taper. - Started on Ambien for sleep.    LOS: 5 days   Allister Lessley 08/07/2011, 11:50 AM

## 2011-08-07 NOTE — Progress Notes (Signed)
Internal Medicine Attending  Date: 08/07/2011  Patient name: [name] Medical record number: [id number] Date of birth: 04-Jan-1920 Age: 78 y.o. Gender: female  I saw and evaluated the patient. I reviewed the resident's note by Dr. Allena Katz and I agree with the resident's findings and plans as documented in his progress note.  Ms. Klumpp was feeling well this morning and was looking forward to being transferred to a skilled nursing facility as she was anxious to return home (ALF) after rehabilitation. She was without any new complaints and was tolerating the changes in her medical regimen well. We plan on continuing the Ativan taper and using the Ambien exclusively for sleep. I agree with the housestaff's plan to transfer her to [facility] today.

## 2011-08-08 NOTE — Progress Notes (Signed)
Clinical Social Work-CSW obtained passarr # from state for pt to d/c to [facility]-pt dtr aware and at bedside-pt d/c with chart copy/PTAR transport and FL2-No further needs at this time- Jodean Lima, [phone]

## 2011-08-09 MED FILL — Gabapentin Tab 600 MG: ORAL | Qty: 1 | Status: AC

## 2011-08-09 MED FILL — Duloxetine HCl Enteric Coated Pellets Cap 60 MG (Base Eq): ORAL | Qty: 1 | Status: AC

## 2011-08-12 NOTE — Discharge Summary (Signed)
Please note the date of discharge was 08/07/2011, not 08/04/2011.

## 2011-08-22 ENCOUNTER — Encounter: Payer: Medicare Other | Admitting: Physician Assistant

## 2011-08-23 ENCOUNTER — Encounter: Payer: Medicare Other | Admitting: Physician Assistant

## 2011-08-28 ENCOUNTER — Encounter: Payer: Medicare Other | Admitting: Physician Assistant

## 2011-09-11 ENCOUNTER — Encounter: Payer: Self-pay | Admitting: Internal Medicine

## 2011-09-11 ENCOUNTER — Ambulatory Visit (INDEPENDENT_AMBULATORY_CARE_PROVIDER_SITE_OTHER): Payer: Medicare Other | Admitting: *Deleted

## 2011-09-11 ENCOUNTER — Ambulatory Visit (INDEPENDENT_AMBULATORY_CARE_PROVIDER_SITE_OTHER): Payer: Medicare Other | Admitting: Physician Assistant

## 2011-09-11 ENCOUNTER — Encounter: Payer: Self-pay | Admitting: Physician Assistant

## 2011-09-11 VITALS — BP 120/78 | HR 93 | Ht 68.0 in | Wt 186.0 lb

## 2011-09-11 DIAGNOSIS — G629 Polyneuropathy, unspecified: Secondary | ICD-10-CM

## 2011-09-11 DIAGNOSIS — I442 Atrioventricular block, complete: Secondary | ICD-10-CM

## 2011-09-11 DIAGNOSIS — I480 Paroxysmal atrial fibrillation: Secondary | ICD-10-CM | POA: Insufficient documentation

## 2011-09-11 DIAGNOSIS — I459 Conduction disorder, unspecified: Secondary | ICD-10-CM

## 2011-09-11 DIAGNOSIS — G589 Mononeuropathy, unspecified: Secondary | ICD-10-CM

## 2011-09-11 DIAGNOSIS — I4891 Unspecified atrial fibrillation: Secondary | ICD-10-CM

## 2011-09-11 NOTE — Progress Notes (Signed)
PPM check 

## 2011-09-11 NOTE — Patient Instructions (Addendum)
Your physician wants you to follow-up in: 6 MONTHS WITH DR. KLEIN. You will receive a reminder letter in the mail two months in advance. If you don't receive a letter, please call our office to schedule the follow-up appointment.   NO CHANGES WERE MADE TODAY 

## 2011-09-11 NOTE — Progress Notes (Signed)
[address] [place], [place]  [address] Phone: [phone] Fax:  [phone]  Date:  09/11/2011   Name:  [name]   DOB:  27-Jan-1919   MRN:  [id number]  PCP:  [name], MD  Primary Cardiologist/Primary Electrophysiologist:  Dr. Sherryl Manges    History of Present Illness: [name] is a 78 y.o. female who returns for post hospital follow up.  She has a history of complete heart block, status post pacemaker implantation. She was admitted 7/17-7/19 for syncope In the setting of urinary tract infection. She had fallen at the nursing home and could not get up. She had evidence of rhabdomyolysis upon admission. She was seen by cardiology. He was some evidence for atrial arrhythmia. Cardiac enzymes were negative. Her medications were adjusted as it was felt that polypharmacy probably contributed to weakness and her fall. No specific recommendations were made. She now resides at [facility]. She is doing well. She denies any further syncopal episodes. She denies chest pain or shortness of breath. She denies orthopnea or PND. She denies pedal edema. Her biggest complaint is that of lower extremity numbness. She has significant neuropathy. She ambulates with a walker. He has a fear of falling.   Past Medical History  Diagnosis Date  . Complete heart block   . Visual problems     "very limited in right eye"  . Pacemaker     FOR COMPLETE HEART BLOCK....MEDTRONIC ADAPTA R01  . Peripheral neuropathy   . History of blood transfusion     "only w/operations"  . Arthritis     "all over kind of"  . Syncope and collapse 08/02/11    "first time ever"  . Dementia 08/02/11    Delene Loll; "forgetful, repeating"    Current Outpatient Prescriptions  Medication Sig Dispense Refill  . acetaminophen (TYLENOL) 325 MG tablet Take 2 tablets (650 mg total) by mouth every 6 (six) hours as needed (or Fever >/= 101).  60 tablet  0  . DULoxetine (CYMBALTA) 60 MG capsule  Take 1 capsule (60 mg total) by mouth 2 (two) times daily.  60 capsule  1  . gabapentin (NEURONTIN) 300 MG capsule Take 2 capsules (600 mg total) by mouth every 8 (eight) hours.  180 capsule  0  . HYDROcodone-acetaminophen (NORCO/VICODIN) 5-325 MG per tablet Take 1-2 tablets by mouth every 6 (six) hours as needed. For pain      . potassium chloride (K-DUR) 10 MEQ tablet Take 20 mEq by mouth daily.      Marland Kitchen zolpidem (AMBIEN) 5 MG tablet Take 1 tablet (5 mg total) by mouth at bedtime as needed for sleep.  30 tablet  0    Allergies: Allergies  Allergen Reactions  . Latex Itching, Rash and Other (See Comments)    Bandaging and tape w/adhesive "sometimes get rash; sometimes pretty bad" Causes redness     History  Substance Use Topics  . Smoking status: Never Smoker   . Smokeless tobacco: Never Used  . Alcohol Use: Yes     08/02/11 "have martini maybe once q 3-4 months"      PHYSICAL EXAM: VS:  BP 120/78  Pulse 93  Ht 5\' 8"  (1.727 m)  Wt 186 lb (84.369 kg)  BMI 28.28 kg/m2 Well nourished, well developed, in no acute distress HEENT: normal Neck: no JVDAt 90 Cardiac:  normal S1, S2; RRR; no murmur Lungs:  clear to auscultation bilaterally, no wheezing, rhonchi or rales Abd: soft, nontender, no  hepatomegaly Ext: no edema Skin: warm and dry Neuro:  CNs 2-12 intact, no focal abnormalities noted  EKG:  Sinus rhythm, ventricular paced, heart rate 93  Device Interrogation:  Functioning appropriately. She had 0.5% mode switches since the last interrogation. She had 14 high atrial rate episodes the longest being one hour 25 minutes.      ASSESSMENT AND PLAN:  1. Complete Heart Block Pacemaker functioning appropriately. Plan followup with Dr. Graciela Husbands in 6 months.  2. Paroxysmal Atrial Fibrillation Infrequent as noted on interrogation of her device.  Discussed with Dr. Sherryl Manges. Risk of stroke is probably high enough in the context of her infrequent AFib to recommend coumadin at  this time.  This will be monitored.   3. Peripheral Neuropathy She will followup with her PCP for further evaluation and management.  4. Syncope Unexplained. No further recurrence. This was in the context of urinary tract infection and polypharmacy. No further cardiac workup at this time.  Signed, Tereso Newcomer, PA-C  2:40 PM 09/11/2011

## 2011-09-12 LAB — PACEMAKER DEVICE OBSERVATION
AL AMPLITUDE: 2 mv
ATRIAL PACING PM: 0
BMOD-0003RV: 30
BRDY-0003RV: 130 {beats}/min
RV LEAD IMPEDENCE PM: 522 Ohm
RV LEAD THRESHOLD: 0.625 V
VENTRICULAR PACING PM: 100

## 2012-07-02 ENCOUNTER — Telehealth: Payer: Self-pay | Admitting: Internal Medicine

## 2012-07-02 ENCOUNTER — Encounter: Payer: Self-pay | Admitting: Internal Medicine

## 2012-07-02 NOTE — Telephone Encounter (Signed)
07-02-12 sent past due letter certified/mt

## 2012-08-15 ENCOUNTER — Telehealth: Payer: Self-pay | Admitting: Internal Medicine

## 2012-08-15 NOTE — Telephone Encounter (Signed)
08-15-12 past 30 days, certified letter sent 07-02-12/mt

## 2014-05-23 ENCOUNTER — Emergency Department (HOSPITAL_COMMUNITY): Payer: Medicare Other

## 2014-05-23 ENCOUNTER — Encounter (HOSPITAL_COMMUNITY): Payer: Self-pay | Admitting: Emergency Medicine

## 2014-05-23 ENCOUNTER — Inpatient Hospital Stay (HOSPITAL_COMMUNITY)
Admission: EM | Admit: 2014-05-23 | Discharge: 2014-06-01 | DRG: 493 | Disposition: A | Discharge status: Skilled Nursing Facility | Payer: Medicare Other | Attending: Internal Medicine | Admitting: Internal Medicine

## 2014-05-23 DIAGNOSIS — Z9842 Cataract extraction status, left eye: Secondary | ICD-10-CM

## 2014-05-23 DIAGNOSIS — Z01811 Encounter for preprocedural respiratory examination: Secondary | ICD-10-CM

## 2014-05-23 DIAGNOSIS — M1990 Unspecified osteoarthritis, unspecified site: Secondary | ICD-10-CM | POA: Diagnosis present

## 2014-05-23 DIAGNOSIS — S82843B Displaced bimalleolar fracture of unspecified lower leg, initial encounter for open fracture type I or II: Secondary | ICD-10-CM | POA: Diagnosis present

## 2014-05-23 DIAGNOSIS — Z961 Presence of intraocular lens: Secondary | ICD-10-CM | POA: Diagnosis present

## 2014-05-23 DIAGNOSIS — I459 Conduction disorder, unspecified: Secondary | ICD-10-CM | POA: Diagnosis present

## 2014-05-23 DIAGNOSIS — Z419 Encounter for procedure for purposes other than remedying health state, unspecified: Secondary | ICD-10-CM

## 2014-05-23 DIAGNOSIS — I4891 Unspecified atrial fibrillation: Secondary | ICD-10-CM | POA: Diagnosis present

## 2014-05-23 DIAGNOSIS — D62 Acute posthemorrhagic anemia: Secondary | ICD-10-CM | POA: Diagnosis not present

## 2014-05-23 DIAGNOSIS — Z23 Encounter for immunization: Secondary | ICD-10-CM

## 2014-05-23 DIAGNOSIS — Y92099 Unspecified place in other non-institutional residence as the place of occurrence of the external cause: Secondary | ICD-10-CM

## 2014-05-23 DIAGNOSIS — Z91040 Latex allergy status: Secondary | ICD-10-CM

## 2014-05-23 DIAGNOSIS — Z79899 Other long term (current) drug therapy: Secondary | ICD-10-CM

## 2014-05-23 DIAGNOSIS — F0390 Unspecified dementia without behavioral disturbance: Secondary | ICD-10-CM | POA: Diagnosis present

## 2014-05-23 DIAGNOSIS — T148XXA Other injury of unspecified body region, initial encounter: Secondary | ICD-10-CM

## 2014-05-23 DIAGNOSIS — Z91048 Other nonmedicinal substance allergy status: Secondary | ICD-10-CM

## 2014-05-23 DIAGNOSIS — S82899A Other fracture of unspecified lower leg, initial encounter for closed fracture: Secondary | ICD-10-CM

## 2014-05-23 DIAGNOSIS — Z9841 Cataract extraction status, right eye: Secondary | ICD-10-CM

## 2014-05-23 DIAGNOSIS — M25572 Pain in left ankle and joints of left foot: Secondary | ICD-10-CM | POA: Diagnosis not present

## 2014-05-23 DIAGNOSIS — G629 Polyneuropathy, unspecified: Secondary | ICD-10-CM

## 2014-05-23 DIAGNOSIS — W07XXXA Fall from chair, initial encounter: Secondary | ICD-10-CM | POA: Diagnosis present

## 2014-05-23 DIAGNOSIS — S82843A Displaced bimalleolar fracture of unspecified lower leg, initial encounter for closed fracture: Principal | ICD-10-CM | POA: Diagnosis present

## 2014-05-23 DIAGNOSIS — D72829 Elevated white blood cell count, unspecified: Secondary | ICD-10-CM | POA: Diagnosis present

## 2014-05-23 DIAGNOSIS — S8262XB Displaced fracture of lateral malleolus of left fibula, initial encounter for open fracture type I or II: Secondary | ICD-10-CM

## 2014-05-23 DIAGNOSIS — I10 Essential (primary) hypertension: Secondary | ICD-10-CM | POA: Diagnosis present

## 2014-05-23 DIAGNOSIS — Z96659 Presence of unspecified artificial knee joint: Secondary | ICD-10-CM | POA: Diagnosis present

## 2014-05-23 DIAGNOSIS — I480 Paroxysmal atrial fibrillation: Secondary | ICD-10-CM | POA: Diagnosis present

## 2014-05-23 DIAGNOSIS — Z950 Presence of cardiac pacemaker: Secondary | ICD-10-CM | POA: Diagnosis present

## 2014-05-23 DIAGNOSIS — W19XXXA Unspecified fall, initial encounter: Secondary | ICD-10-CM | POA: Diagnosis present

## 2014-05-23 DIAGNOSIS — R52 Pain, unspecified: Secondary | ICD-10-CM

## 2014-05-23 MED ORDER — TETANUS-DIPHTH-ACELL PERTUSSIS 5-2.5-18.5 LF-MCG/0.5 IM SUSP
0.5000 mL | Freq: Once | INTRAMUSCULAR | Status: AC
  Administered 2014-05-24: 0.5 mL via INTRAMUSCULAR
  Filled 2014-05-23: qty 0.5

## 2014-05-23 MED ORDER — MORPHINE SULFATE 4 MG/ML IJ SOLN
4.0000 mg | Freq: Once | INTRAMUSCULAR | Status: AC
  Administered 2014-05-23: 4 mg via INTRAVENOUS
  Filled 2014-05-23: qty 1

## 2014-05-23 MED ORDER — CEFAZOLIN SODIUM 1-5 GM-% IV SOLN
1.0000 g | Freq: Once | INTRAVENOUS | Status: AC
  Administered 2014-05-23: 1 g via INTRAVENOUS
  Filled 2014-05-23: qty 50
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
## 2014-05-24 ENCOUNTER — Inpatient Hospital Stay (HOSPITAL_COMMUNITY): Payer: Medicare Other

## 2014-05-24 ENCOUNTER — Encounter (HOSPITAL_COMMUNITY): Payer: Self-pay | Admitting: Certified Registered"

## 2014-05-24 ENCOUNTER — Inpatient Hospital Stay (HOSPITAL_COMMUNITY): Payer: Medicare Other | Admitting: Anesthesiology

## 2014-05-24 ENCOUNTER — Encounter (HOSPITAL_COMMUNITY)
Admission: EM | Disposition: A | Discharge status: Skilled Nursing Facility | Payer: Self-pay | Source: Home / Self Care | Attending: Internal Medicine

## 2014-05-24 ENCOUNTER — Emergency Department (HOSPITAL_COMMUNITY): Payer: Medicare Other

## 2014-05-24 DIAGNOSIS — G629 Polyneuropathy, unspecified: Secondary | ICD-10-CM

## 2014-05-24 DIAGNOSIS — S82843B Displaced bimalleolar fracture of unspecified lower leg, initial encounter for open fracture type I or II: Secondary | ICD-10-CM | POA: Diagnosis present

## 2014-05-24 DIAGNOSIS — Z9842 Cataract extraction status, left eye: Secondary | ICD-10-CM | POA: Diagnosis not present

## 2014-05-24 DIAGNOSIS — M1990 Unspecified osteoarthritis, unspecified site: Secondary | ICD-10-CM | POA: Diagnosis not present

## 2014-05-24 DIAGNOSIS — Z961 Presence of intraocular lens: Secondary | ICD-10-CM | POA: Diagnosis not present

## 2014-05-24 DIAGNOSIS — W07XXXA Fall from chair, initial encounter: Secondary | ICD-10-CM | POA: Diagnosis not present

## 2014-05-24 DIAGNOSIS — D72829 Elevated white blood cell count, unspecified: Secondary | ICD-10-CM | POA: Diagnosis not present

## 2014-05-24 DIAGNOSIS — Z91048 Other nonmedicinal substance allergy status: Secondary | ICD-10-CM | POA: Diagnosis not present

## 2014-05-24 DIAGNOSIS — Z79899 Other long term (current) drug therapy: Secondary | ICD-10-CM | POA: Diagnosis not present

## 2014-05-24 DIAGNOSIS — M25572 Pain in left ankle and joints of left foot: Secondary | ICD-10-CM | POA: Diagnosis present

## 2014-05-24 DIAGNOSIS — I482 Chronic atrial fibrillation: Secondary | ICD-10-CM | POA: Diagnosis not present

## 2014-05-24 DIAGNOSIS — I4891 Unspecified atrial fibrillation: Secondary | ICD-10-CM

## 2014-05-24 DIAGNOSIS — Z96659 Presence of unspecified artificial knee joint: Secondary | ICD-10-CM | POA: Diagnosis not present

## 2014-05-24 DIAGNOSIS — S82843A Displaced bimalleolar fracture of unspecified lower leg, initial encounter for closed fracture: Secondary | ICD-10-CM | POA: Diagnosis present

## 2014-05-24 DIAGNOSIS — S82892S Other fracture of left lower leg, sequela: Secondary | ICD-10-CM | POA: Diagnosis not present

## 2014-05-24 DIAGNOSIS — S82842D Displaced bimalleolar fracture of left lower leg, subsequent encounter for closed fracture with routine healing: Secondary | ICD-10-CM | POA: Diagnosis not present

## 2014-05-24 DIAGNOSIS — Z9841 Cataract extraction status, right eye: Secondary | ICD-10-CM | POA: Diagnosis not present

## 2014-05-24 DIAGNOSIS — W19XXXA Unspecified fall, initial encounter: Secondary | ICD-10-CM | POA: Diagnosis not present

## 2014-05-24 DIAGNOSIS — S82842B Displaced bimalleolar fracture of left lower leg, initial encounter for open fracture type I or II: Secondary | ICD-10-CM | POA: Diagnosis not present

## 2014-05-24 DIAGNOSIS — S82899A Other fracture of unspecified lower leg, initial encounter for closed fracture: Secondary | ICD-10-CM | POA: Insufficient documentation

## 2014-05-24 DIAGNOSIS — I459 Conduction disorder, unspecified: Secondary | ICD-10-CM | POA: Diagnosis not present

## 2014-05-24 DIAGNOSIS — F0390 Unspecified dementia without behavioral disturbance: Secondary | ICD-10-CM | POA: Diagnosis present

## 2014-05-24 DIAGNOSIS — Z950 Presence of cardiac pacemaker: Secondary | ICD-10-CM | POA: Diagnosis not present

## 2014-05-24 DIAGNOSIS — Z91040 Latex allergy status: Secondary | ICD-10-CM | POA: Diagnosis not present

## 2014-05-24 DIAGNOSIS — Z23 Encounter for immunization: Secondary | ICD-10-CM | POA: Diagnosis not present

## 2014-05-24 DIAGNOSIS — I10 Essential (primary) hypertension: Secondary | ICD-10-CM | POA: Diagnosis not present

## 2014-05-24 DIAGNOSIS — I480 Paroxysmal atrial fibrillation: Secondary | ICD-10-CM | POA: Diagnosis not present

## 2014-05-24 DIAGNOSIS — D62 Acute posthemorrhagic anemia: Secondary | ICD-10-CM | POA: Diagnosis not present

## 2014-05-24 DIAGNOSIS — W19XXXS Unspecified fall, sequela: Secondary | ICD-10-CM | POA: Diagnosis not present

## 2014-05-24 DIAGNOSIS — Y92099 Unspecified place in other non-institutional residence as the place of occurrence of the external cause: Secondary | ICD-10-CM | POA: Diagnosis not present

## 2014-05-24 HISTORY — PX: EXTERNAL FIXATION LEG: SHX1549

## 2014-05-24 LAB — CBC WITH DIFFERENTIAL/PLATELET
Basophils Absolute: 0 10*3/uL (ref 0.0–0.1)
Basophils Relative: 0 % (ref 0–1)
EOS ABS: 0 10*3/uL (ref 0.0–0.7)
EOS PCT: 0 % (ref 0–5)
HCT: 38.5 % (ref 36.0–46.0)
Hemoglobin: 13 g/dL (ref 12.0–15.0)
LYMPHS ABS: 0.6 10*3/uL — AB (ref 0.7–4.0)
Lymphocytes Relative: 5 % — ABNORMAL LOW (ref 12–46)
MCH: 31.7 pg (ref 26.0–34.0)
MCHC: 33.8 g/dL (ref 30.0–36.0)
MCV: 93.9 fL (ref 78.0–100.0)
Monocytes Absolute: 0.9 10*3/uL (ref 0.1–1.0)
Monocytes Relative: 7 % (ref 3–12)
NEUTROS PCT: 88 % — AB (ref 43–77)
Neutro Abs: 11.5 10*3/uL — ABNORMAL HIGH (ref 1.7–7.7)
PLATELETS: 224 10*3/uL (ref 150–400)
RBC: 4.1 MIL/uL (ref 3.87–5.11)
RDW: 13.7 % (ref 11.5–15.5)
WBC: 13 10*3/uL — ABNORMAL HIGH (ref 4.0–10.5)

## 2014-05-24 LAB — BASIC METABOLIC PANEL
ANION GAP: 13 (ref 5–15)
Anion gap: 10 (ref 5–15)
BUN: 22 mg/dL — ABNORMAL HIGH (ref 6–20)
BUN: 22 mg/dL — ABNORMAL HIGH (ref 6–20)
CALCIUM: 9 mg/dL (ref 8.9–10.3)
CHLORIDE: 104 mmol/L (ref 101–111)
CO2: 22 mmol/L (ref 22–32)
CO2: 26 mmol/L (ref 22–32)
CREATININE: 0.78 mg/dL (ref 0.44–1.00)
CREATININE: 0.79 mg/dL (ref 0.44–1.00)
Calcium: 9.1 mg/dL (ref 8.9–10.3)
Chloride: 104 mmol/L (ref 101–111)
GFR calc Af Amer: >60 mL/min (ref >60)
GFR calc Af Amer: >60 mL/min (ref >60)
GFR calc non Af Amer: >60 mL/min (ref >60)
GFR calc non Af Amer: >60 mL/min (ref >60)
GLUCOSE: 158 mg/dL — AB (ref 70–99)
Glucose, Bld: 132 mg/dL — ABNORMAL HIGH (ref 70–99)
Potassium: 4.3 mmol/L (ref 3.5–5.1)
Potassium: 4.5 mmol/L (ref 3.5–5.1)
Sodium: 139 mmol/L (ref 135–145)
Sodium: 140 mmol/L (ref 135–145)

## 2014-05-24 LAB — I-STAT CG4 LACTIC ACID, ED: Lactic Acid, Venous: 1.84 mmol/L (ref 0.5–2.0)

## 2014-05-24 LAB — CBC
HEMATOCRIT: 38.4 % (ref 36.0–46.0)
Hemoglobin: 13 g/dL (ref 12.0–15.0)
MCH: 32.1 pg (ref 26.0–34.0)
MCHC: 33.9 g/dL (ref 30.0–36.0)
MCV: 94.8 fL (ref 78.0–100.0)
PLATELETS: 210 10*3/uL (ref 150–400)
RBC: 4.05 MIL/uL (ref 3.87–5.11)
RDW: 13.8 % (ref 11.5–15.5)
WBC: 13.4 10*3/uL — AB (ref 4.0–10.5)

## 2014-05-24 LAB — CK: CK TOTAL: 572 U/L — AB (ref 38–234)

## 2014-05-24 SURGERY — EXTERNAL FIXATION, LOWER EXTREMITY
Anesthesia: General | Site: Ankle | Laterality: Left

## 2014-05-24 MED ORDER — SODIUM CHLORIDE 0.9 % IJ SOLN
3.0000 mL | Freq: Two times a day (BID) | INTRAMUSCULAR | Status: DC
  Administered 2014-05-24 – 2014-05-31 (×11): 3 mL via INTRAVENOUS

## 2014-05-24 MED ORDER — DEXAMETHASONE SODIUM PHOSPHATE 4 MG/ML IJ SOLN
INTRAMUSCULAR | Status: AC
  Filled 2014-05-24: qty 1

## 2014-05-24 MED ORDER — SODIUM CHLORIDE 0.9 % IJ SOLN
INTRAMUSCULAR | Status: AC
  Filled 2014-05-24: qty 10

## 2014-05-24 MED ORDER — LIDOCAINE HCL (CARDIAC) 20 MG/ML IV SOLN
INTRAVENOUS | Status: DC | PRN
  Administered 2014-05-24: 100 mg via INTRAVENOUS

## 2014-05-24 MED ORDER — STERILE WATER FOR INJECTION IJ SOLN
INTRAMUSCULAR | Status: AC
  Filled 2014-05-24: qty 10

## 2014-05-24 MED ORDER — LIDOCAINE HCL (CARDIAC) 20 MG/ML IV SOLN
INTRAVENOUS | Status: AC
Start: 2014-05-24 — End: 2014-05-24
  Filled 2014-05-24: qty 5

## 2014-05-24 MED ORDER — LIDOCAINE HCL (CARDIAC) 20 MG/ML IV SOLN
INTRAVENOUS | Status: AC
  Filled 2014-05-24: qty 5

## 2014-05-24 MED ORDER — HYDROMORPHONE HCL 1 MG/ML IJ SOLN
0.2500 mg | INTRAMUSCULAR | Status: DC | PRN
  Administered 2014-05-24 (×2): 0.5 mg via INTRAVENOUS

## 2014-05-24 MED ORDER — DEXAMETHASONE SODIUM PHOSPHATE 4 MG/ML IJ SOLN
INTRAMUSCULAR | Status: DC | PRN
  Administered 2014-05-24: 4 mg via INTRAVENOUS

## 2014-05-24 MED ORDER — PROPOFOL 10 MG/ML IV BOLUS
INTRAVENOUS | Status: DC | PRN
  Administered 2014-05-24: 90 mg via INTRAVENOUS

## 2014-05-24 MED ORDER — PHENYLEPHRINE HCL 10 MG/ML IJ SOLN
INTRAMUSCULAR | Status: DC | PRN
  Administered 2014-05-24: 160 ug via INTRAVENOUS
  Administered 2014-05-24: 80 ug via INTRAVENOUS
  Administered 2014-05-24: 160 ug via INTRAVENOUS
  Administered 2014-05-24: 80 ug via INTRAVENOUS

## 2014-05-24 MED ORDER — LACTATED RINGERS IV SOLN
INTRAVENOUS | Status: DC | PRN
  Administered 2014-05-24 (×2): via INTRAVENOUS

## 2014-05-24 MED ORDER — CEFAZOLIN SODIUM-DEXTROSE 2-3 GM-% IV SOLR
INTRAVENOUS | Status: DC | PRN
  Administered 2014-05-24: 2 g via INTRAVENOUS

## 2014-05-24 MED ORDER — ALUM & MAG HYDROXIDE-SIMETH 200-200-20 MG/5ML PO SUSP: 30.0000 mL | Freq: Four times a day (QID) | ORAL | Status: DC | PRN

## 2014-05-24 MED ORDER — HYDROMORPHONE HCL 1 MG/ML IJ SOLN
INTRAMUSCULAR | Status: AC
  Filled 2014-05-24: qty 1

## 2014-05-24 MED ORDER — ONDANSETRON HCL 4 MG/2ML IJ SOLN
INTRAMUSCULAR | Status: DC | PRN
  Administered 2014-05-24: 4 mg via INTRAVENOUS

## 2014-05-24 MED ORDER — ACETAMINOPHEN 325 MG PO TABS
650.0000 mg | ORAL_TABLET | Freq: Four times a day (QID) | ORAL | Status: DC | PRN
  Administered 2014-05-30: 650 mg via ORAL
  Filled 2014-05-24: qty 2

## 2014-05-24 MED ORDER — ROCURONIUM BROMIDE 50 MG/5ML IV SOLN
INTRAVENOUS | Status: AC
  Filled 2014-05-24: qty 1

## 2014-05-24 MED ORDER — SODIUM CHLORIDE 0.9 % IR SOLN
Status: DC | PRN
  Administered 2014-05-24: 3000 mL

## 2014-05-24 MED ORDER — SUFENTANIL CITRATE 50 MCG/ML IV SOLN
INTRAVENOUS | Status: DC | PRN
  Administered 2014-05-24 (×2): 10 ug via INTRAVENOUS

## 2014-05-24 MED ORDER — PHENYLEPHRINE 40 MCG/ML (10ML) SYRINGE FOR IV PUSH (FOR BLOOD PRESSURE SUPPORT)
PREFILLED_SYRINGE | INTRAVENOUS | Status: AC
  Filled 2014-05-24: qty 10

## 2014-05-24 MED ORDER — SODIUM CHLORIDE 0.9 % IV SOLN
INTRAVENOUS | Status: DC
  Administered 2014-05-24 – 2014-05-29 (×7): via INTRAVENOUS

## 2014-05-24 MED ORDER — EPHEDRINE SULFATE 50 MG/ML IJ SOLN
INTRAMUSCULAR | Status: AC
  Filled 2014-05-24: qty 1

## 2014-05-24 MED ORDER — HYDROMORPHONE HCL 1 MG/ML IJ SOLN
1.0000 mg | Freq: Once | INTRAMUSCULAR | Status: AC
  Administered 2014-05-24: 1 mg via INTRAVENOUS
  Filled 2014-05-24: qty 1

## 2014-05-24 MED ORDER — SUCCINYLCHOLINE CHLORIDE 20 MG/ML IJ SOLN
INTRAMUSCULAR | Status: AC
  Filled 2014-05-24: qty 1

## 2014-05-24 MED ORDER — HEPARIN SODIUM (PORCINE) 5000 UNIT/ML IJ SOLN
5000.0000 [IU] | Freq: Three times a day (TID) | INTRAMUSCULAR | Status: AC
  Administered 2014-05-25 – 2014-05-26 (×5): 5000 [IU] via SUBCUTANEOUS
  Filled 2014-05-24 (×5): qty 1

## 2014-05-24 MED ORDER — MEPERIDINE HCL 25 MG/ML IJ SOLN: 6.2500 mg | INTRAMUSCULAR | Status: DC | PRN

## 2014-05-24 MED ORDER — ACETAMINOPHEN 650 MG RE SUPP: 650.0000 mg | Freq: Four times a day (QID) | RECTAL | Status: DC | PRN

## 2014-05-24 MED ORDER — ONDANSETRON HCL 4 MG/2ML IJ SOLN
INTRAMUSCULAR | Status: AC
  Filled 2014-05-24: qty 2

## 2014-05-24 MED ORDER — PROPOFOL 10 MG/ML IV BOLUS
INTRAVENOUS | Status: AC
  Filled 2014-05-24: qty 20

## 2014-05-24 MED ORDER — ONDANSETRON HCL 4 MG/2ML IJ SOLN: 4.0000 mg | Freq: Once | INTRAMUSCULAR | Status: DC | PRN

## 2014-05-24 MED ORDER — ONDANSETRON HCL 4 MG PO TABS: 4.0000 mg | ORAL_TABLET | Freq: Four times a day (QID) | ORAL | Status: DC | PRN

## 2014-05-24 MED ORDER — HYDROMORPHONE HCL 1 MG/ML IJ SOLN
0.5000 mg | INTRAMUSCULAR | Status: DC | PRN
  Administered 2014-05-24 – 2014-05-29 (×11): 1 mg via INTRAVENOUS
  Filled 2014-05-24 (×11): qty 1

## 2014-05-24 MED ORDER — SUFENTANIL CITRATE 50 MCG/ML IV SOLN
INTRAVENOUS | Status: AC
  Filled 2014-05-24: qty 1

## 2014-05-24 MED ORDER — ONDANSETRON HCL 4 MG/2ML IJ SOLN: 4.0000 mg | Freq: Four times a day (QID) | INTRAMUSCULAR | Status: DC | PRN

## 2014-05-24 MED ORDER — OXYCODONE HCL 5 MG PO TABS
5.0000 mg | ORAL_TABLET | ORAL | Status: DC | PRN
  Administered 2014-05-24 – 2014-05-31 (×17): 5 mg via ORAL
  Filled 2014-05-24 (×17): qty 1

## 2014-05-24 MED ORDER — SUCCINYLCHOLINE CHLORIDE 20 MG/ML IJ SOLN
INTRAMUSCULAR | Status: DC | PRN
  Administered 2014-05-24: 100 mg via INTRAVENOUS

## 2014-05-24 MED ORDER — ETOMIDATE 2 MG/ML IV SOLN
INTRAVENOUS | Status: AC
  Filled 2014-05-24: qty 10

## 2014-05-24 SURGICAL SUPPLY — 39 items
BNDG GAUZE ELAST 4 BULKY (GAUZE/BANDAGES/DRESSINGS) ×2 IMPLANT
COVER SURGICAL LIGHT HANDLE (MISCELLANEOUS) ×2 IMPLANT
DRAPE C-ARM 42X72 X-RAY (DRAPES) ×2 IMPLANT
DRAPE C-ARMOR (DRAPES) ×2 IMPLANT
DRSG PAD ABDOMINAL 8X10 ST (GAUZE/BANDAGES/DRESSINGS) ×2 IMPLANT
ELECT REM PT RETURN 9FT ADLT (ELECTROSURGICAL) ×2
ELECTRODE REM PT RTRN 9FT ADLT (ELECTROSURGICAL) ×1 IMPLANT
FACESHIELD STD STERILE (MASK) ×2 IMPLANT
GAUZE XEROFORM 1X8 LF (GAUZE/BANDAGES/DRESSINGS) ×2 IMPLANT
GLOVE BIOGEL PI IND STRL 7.5 (GLOVE) ×1 IMPLANT
GLOVE BIOGEL PI INDICATOR 7.5 (GLOVE) ×1
GLOVE NEODERM STRL 7.5 LF PF (GLOVE) ×2 IMPLANT
GLOVE SURG NEODERM 7.5  LF PF (GLOVE) ×2
GLOVE SURG SYN 7.5  E (GLOVE) ×1
GLOVE SURG SYN 7.5 E (GLOVE) ×1 IMPLANT
GOWN STRL REIN XL XLG (GOWN DISPOSABLE) ×2 IMPLANT
KIT BASIN OR (CUSTOM PROCEDURE TRAY) ×2 IMPLANT
KIT ROOM TURNOVER OR (KITS) ×2 IMPLANT
NS IRRIG 1000ML POUR BTL (IV SOLUTION) ×2 IMPLANT
PACK ORTHO EXTREMITY (CUSTOM PROCEDURE TRAY) ×2 IMPLANT
PAD ARMBOARD 7.5X6 YLW CONV (MISCELLANEOUS) ×4 IMPLANT
PIN APEX 180X50X5XST SLF (PIN) ×1 IMPLANT
PIN APEX 5X180 (PIN) ×1
PIN TO ROD COUPLING EXFIX (EXFIX) ×12 IMPLANT
PIN TRANSFIXING EXFIX (EXFIX) ×2 IMPLANT
ROD HOFFMANN3 CONNECT 11X200 (EXFIX) ×4 IMPLANT
ROD HOFFMANN3 CONNECT 11X350 (EXFIX) ×2 IMPLANT
ROD SEMI CIRCULAR EXFIX (EXFIX) ×2 IMPLANT
ROD TO ROD COUPLING EXFIX (EXFIX) ×8 IMPLANT
SET CYSTO W/LG BORE CLAMP LF (SET/KITS/TRAYS/PACK) ×2 IMPLANT
SPONGE GAUZE 4X4 12PLY STER LF (GAUZE/BANDAGES/DRESSINGS) ×2 IMPLANT
SPONGE SCRUB IODOPHOR (GAUZE/BANDAGES/DRESSINGS) ×2 IMPLANT
STOCKINETTE IMPERVIOUS LG (DRAPES) ×2 IMPLANT
SUT ETHILON 2 0 FS 18 (SUTURE) ×4 IMPLANT
TOWEL OR 17X24 6PK STRL BLUE (TOWEL DISPOSABLE) ×4 IMPLANT
TOWEL OR 17X26 10 PK STRL BLUE (TOWEL DISPOSABLE) ×4 IMPLANT
TUBE CONNECTING 12X1/4 (SUCTIONS) ×2 IMPLANT
UNDERPAD 30X30 INCONTINENT (UNDERPADS AND DIAPERS) ×2 IMPLANT
YANKAUER SUCT BULB TIP NO VENT (SUCTIONS) ×2 IMPLANT
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
## 2014-05-24 NOTE — Transfer of Care (Signed)
Immediate Anesthesia Transfer of Care Note  Patient: [name]  Procedure(s) Performed: Procedure(s): IRRIGATION AND DEBRIDEMENT, EXTERNAL FIXATION LEG (Left)  Patient Location: PACU  Anesthesia Type:General  Level of Consciousness: awake, oriented and patient cooperative  Airway & Oxygen Therapy: Patient Spontanous Breathing and Patient connected to nasal cannula oxygen  Post-op Assessment: Report given to RN, Post -op Vital signs reviewed and stable and Patient moving all extremities X 4  Post vital signs: Reviewed and stable  Last Vitals:  Filed Vitals:   05/24/14 0115  BP: 138/57  Pulse: 88  Temp:   Resp: 21    Complications: No apparent anesthesia complications

## 2014-05-24 NOTE — Progress Notes (Signed)
Patient ID: [name] Vanatta, female   DOB: 08/25/1919, 81 y.o.   MRN: [id number] [facility] PROGRESS NOTE  [name] Pitcock NFA:[id number] DOB: 04/20/1919 DOA: 05/23/2014 PCP: Florentina JennyRIPP, HENRY, MD  Brief narrative:    Addendum to admission note done 05/24/2014  81 y.o. female with a past medical history significant for complete heart block s/p pacemaker placement, atrial fibrillation, neuropathy, from ILF who [facility] status post fall from chair. Apparently, pt fell backwards while sitting on the chair and was unable to get up. X ray studies on admission revealed left ankle dislocation and fracture.  Pt is status post external fixation left ankle fracture, debridement of bone associated with open fracture dislocation, left ankle.  Barrier to discharge: per ortho anticipate pt will go to OR on Wednesday, 5/11. Now NWB LLE. Still needs PT eval to see if disposition is to ILF or she requires SNF.   Assessment/Plan:    Principal Problem: Fall / left ankle fracture - Status post external fixation left ankle fracture and debridement of bone associated with open fracture dislocation, left ankle done 05/24/14 - Plan per ortho pin care Monday, NWB LLE; likely to OR Wednesday - PT eval once able to participate  - Continue pain management efforts   Active Problems:   Atrial fibrillation / Pacemaker  - CHADS vasc score at least 4  - HR controlled - Not on AC due to risk of falls - on Hep sub Q for DVT prophylaxis     Leukocytosis - Likely reactive due to fall - Has received preop cefazolin x once     Essential hypertension - Considering her age would allow higher BP goal such as up to 150/90. Current BP 149/62.  DVT Prophylaxis  - Heparin subQ ordered    Code Status: Full.  Family Communication:  plan of care discussed with the patient; family not at the bedside  Disposition Plan: either to ILF Or SNF depending on PT eval. To OR likely Wednesday so unsure of exact D/C date.  IV  access:  Peripheral IV  Procedures and diagnostic studies:    Dg Ankle 2 Views Left 05/24/2014 Operative placement of hardware for external fixation.     Ct Head Wo Contrast 05/24/2014   1. No acute intracranial process. 2. Remote left frontal lobe infarct. 3. Generalized cerebral atrophy with chronic microvascular ischemic disease.  CT CERVICAL SPINE:  1. No acute traumatic injury within the cervical spine. 2. Advanced multilevel degenerative disc disease as above.     Ct Cervical Spine Wo Contrast 05/24/2014  CT BRAIN:  1. No acute intracranial process. 2. Remote left frontal lobe infarct. 3. Generalized cerebral atrophy with chronic microvascular ischemic disease.  CT CERVICAL SPINE:  1. No acute traumatic injury within the cervical spine. 2. Advanced multilevel degenerative disc disease as above.    Ct Ankle Left Wo Contrast 05/24/2014  1. External fixation of fractures of the left ankle. There is an oblique comminuted fracture of the distal fibula, mildly displaced, with the major distal fracture component displacing laterally by 6 mm. There is a transverse fracture across the base of the medial malleolus, nondisplaced and a small sliver type fracture of the posterior lateral tibia, minimally displaced. 2. Ankle mortise is normally spaced and aligned. 3. There is soft tissue air in edema presumably from the recent wound debridement. No evidence of an abscess. 4. No ankle joint effusion. 5. No CT evidence of osteomyelitis.     Dg Chest Port 1 View 05/24/2014  No active disease.     Dg Ankle Left Port 05/24/2014  Very limited study, demonstrating a fracture dislocation about the left ankle.     Dg C-arm 1-60 Min 05/24/2014   Operative placement of hardware for external fixation.     External fixation left ankle fracture. 05/24/2014.   Medical Consultants:  Orthopedic surgery   Other Consultants:  Physical therapy  IAnti-Infectives:   None    Manson PasseyEVINE, ALMA, MD  [facility] Pager  [phone]  Time spent in minutes: 25 minutes  If 7PM-7AM, please contact night-coverage [email or web address] Password [email or web address] 05/24/2014, 11:36 AM   LOS: 0 days    HPI/Subjective: No acute overnight events. Patient reports pain 6/10 in intensity in left leg.   Objective: Filed Vitals:   05/24/14 0445 05/24/14 0448 05/24/14 0449 05/24/14 0502  BP: 156/62 153/67  149/62  Pulse: 95 96 98 98  Temp:  96.2 F (35.7 C)  97.4 F (36.3 C)  TempSrc:    Oral  Resp: 13 22  20   Height:    5\' 7"  (1.702 m)  Weight:    99.474 kg (219 lb 4.8 oz)  SpO2: 100% 100% 100% 96%    Intake/Output Summary (Last 24 hours) at 05/24/14 1136 Last data filed at 05/24/14 0345  Gross per 24 hour  Intake   1100 ml  Output      0 ml  Net   1100 ml    Exam:   General:  Pt is alert, follows commands appropriately, not in acute distress  Cardiovascular: Regular rate and rhythm, S1/S2 (+)  Respiratory: Clear to auscultation bilaterally, no wheezing, no crackles, no rhonchi  Abdomen: Soft, non tender, non distended, bowel sounds present  Extremities: left foot wrapped, pulses DP and PT palpable bilaterally  Neuro: Grossly nonfocal  Data Reviewed: Basic Metabolic Panel:  Recent Labs Lab 05/24/14 0025 05/24/14 0750  NA 139 140  K 4.3 4.5  CL 104 104  CO2 22 26  GLUCOSE 158* 132*  BUN 22* 22*  CREATININE 0.78 0.79  CALCIUM 9.0 9.1   Liver Function Tests: No results for input(s): AST, ALT, ALKPHOS, BILITOT, PROT, ALBUMIN in the last 168 hours. No results for input(s): LIPASE, AMYLASE in the last 168 hours. No results for input(s): AMMONIA in the last 168 hours. CBC:  Recent Labs Lab 05/24/14 0025 05/24/14 0750  WBC 13.0* 13.4*  NEUTROABS 11.5*  --   HGB 13.0 13.0  HCT 38.5 38.4  MCV 93.9 94.8  PLT 224 210   Cardiac Enzymes:  Recent Labs Lab 05/24/14 0025  CKTOTAL 572*   BNP: Invalid input(s): POCBNP CBG: No results for input(s): GLUCAP in the last 168 hours.  No results found  for this or any previous visit (from the past 240 hour(s)).   Scheduled Meds: . [START ON 05/25/2014] heparin subcutaneous  5,000 Units Subcutaneous 3 times per day  . HYDROmorphone      . sodium chloride  3 mL Intravenous Q12H   Continuous Infusions: . sodium chloride 50 mL/hr at 05/24/14 0400

## 2014-05-24 NOTE — ED Notes (Signed)
Okay received by Dr Lovell SheehanJenkins to remove blanket from around patients neck.  Taken to short stay at this time.  Report given to OR nurse.

## 2014-05-24 NOTE — Anesthesia Procedure Notes (Signed)
Procedure Name: Intubation Date/Time: 05/24/2014 2:18 AM Performed by: Melina SchoolsBANKS, Ammy Lienhard J Pre-anesthesia Checklist: Patient identified, Emergency Drugs available, Suction available, Patient being monitored and Timeout performed Patient Re-evaluated:Patient Re-evaluated prior to inductionOxygen Delivery Method: Circle system utilized Preoxygenation: Pre-oxygenation with 100% oxygen Intubation Type: IV induction, Rapid sequence and Cricoid Pressure applied Laryngoscope Size: Mac and 3 Grade View: Grade IV Tube type: Oral Tube size: 7.5 mm Number of attempts: 1 Airway Equipment and Method: Stylet Placement Confirmation: positive ETCO2 and breath sounds checked- equal and bilateral Secured at: 23 cm Tube secured with: Tape Dental Injury: Teeth and Oropharynx as per pre-operative assessment  Difficulty Due To: Difficulty was anticipated

## 2014-05-24 NOTE — Progress Notes (Signed)
   Subjective:  Patient reports pain as mild.  Objective:   VITALS:   Filed Vitals:   05/24/14 0445 05/24/14 0448 05/24/14 0449 05/24/14 0502  BP: 156/62 153/67  149/62  Pulse: 95 96 98 98  Temp:  96.2 F (35.7 C)  97.4 F (36.3 C)  TempSrc:    Oral  Resp: 13 22  20   Height:    5\' 7"  (1.702 m)  Weight:    99.474 kg (219 lb 4.8 oz)  SpO2: 100% 100% 100% 96%    Foot wwp Ex fix in place 2+ DP pulse   Lab Results  Component Value Date   WBC 13.4* 05/24/2014   HGB 13.0 05/24/2014   HCT 38.4 05/24/2014   MCV 94.8 05/24/2014   PLT 210 05/24/2014     Assessment/Plan:  Day of Surgery   - CT pending - will allow swelling to subside, anticipate return to OR on wed - pin care starting monday - DVT ppx - SCDs, ambulation, sub q heparin - NWB left lower extremity   [name], Nasire Reali Michael 05/24/2014, 9:07 AM [phone]

## 2014-05-24 NOTE — Anesthesia Postprocedure Evaluation (Signed)
Anesthesia Post Note  Patient: [name]  Procedure(s) Performed: Procedure(s) (LRB): IRRIGATION AND DEBRIDEMENT, EXTERNAL FIXATION LEG (Left)  Anesthesia type: general  Patient location: PACU  Post pain: Pain level controlled  Post assessment: Patient's Cardiovascular Status Stable  Last Vitals:  Filed Vitals:   05/24/14 0400  BP: 158/63  Pulse:   Temp:   Resp:     Post vital signs: Reviewed and stable  Level of consciousness: sedated  Complications: No apparent anesthesia complications

## 2014-05-24 NOTE — Op Note (Addendum)
   Date of Surgery: 05/24/2014  INDICATIONS: [name] is a 81 y.o.-year-old female who sustained an open left ankle fracture dislocation; she was indicated for external fixation due to the displaced and unstable nature of the fracture and came to the operating room today for this procedure. The patient and family did consent to the procedure after discussion of the risks and benefits.   PREOPERATIVE DIAGNOSIS: left open ankle fracture dislocation  POSTOPERATIVE DIAGNOSIS: Same.  PROCEDURE:  1. External fixation left ankle fracture. CPT 20692 multiplane 2. Debridement of bone associated with open fracture dislocation, left ankle. 3. Complex wound repair of left medial ankle wound 8 cm.  SURGEON: N. Glee ArvinMichael Roshawn Lacina, M.D.  ANESTHESIA: general  IV FLUIDS AND URINE: See anesthesia.  ESTIMATED BLOOD LOSS: minimal mL.  IMPLANTS: Styker  DRAINS: None.  COMPLICATIONS: None.  DESCRIPTION OF PROCEDURE: The patient was identified in the preoperative holding area.  The operative site was marked by the surgeon and confirmed by the patient.  He was brought back to the operating room.  Anesthesia was induced by the anesthesia team.  A well padded nonsterile tourniquet was placed. The operative extremity was prepped and draped in standard sterile fashion.  A timeout was performed.  Preoperative antibiotics were given.  We began with the I&D portion first.  The bony ends of the fracture were delivered through the wound.  Sharp excisional debridement of bone, skin was performed using a rongeur and knife.  The wound was then thoroughly irrigated with normal saline.  The ankle was then reduced.  We then turned our attention to placing the external fixator.  The bony landmarks were palpated and the pin sites were marked on the skin.  Each Schanz pin was placed in the same fashion -- first drilling with the 3.5 mm drill while copiously irrigating, then hand placing the pin.  This was confirmed on x-ray on both  views.  The ex-fix clamps were placed onto pins and the fracture was pulled into the proper alignment.  The clamps were completely tightened.   Final x-rays were taken in AP and lateral views to confirm the reduction and pin lengths. The wounds were cleaned and dried a final time and a sterile dressing consisting of Xeroform and kerlix was placed. Of note, the patient's compartments remained soft throughout the procedure.  The patient was then transferred back to the bed and left the operating room in stable condition.  All sponge and instrument counts were correct.  POSTOPERATIVE PLAN: [name] will remain non weight bearing with the leg elevated.  she will return to the operating room for definitive fixation when the swelling has gone down.  [name] will receive DVT prophylaxis based on other medications, activity level, and risk ratio of bleeding to thrombosis.  Pin site care will be initiated on postoperative day one.  Mayra ReelN. Michael Lameka Disla, MD [facility] [phone] 3:43 AM
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
## 2014-05-24 NOTE — Anesthesia Preprocedure Evaluation (Addendum)
Anesthesia Evaluation  Patient identified by MRN, date of birth, ID band Patient awake    Reviewed: Allergy & Precautions, NPO status , Patient's Chart, lab work & pertinent test results  Airway Mallampati: II  TM Distance: >3 FB Neck ROM: Full    Dental   Pulmonary    Pulmonary exam normal       Cardiovascular Normal cardiovascular exam+ pacemaker     Neuro/Psych    GI/Hepatic   Endo/Other    Renal/GU      Musculoskeletal   Abdominal   Peds  Hematology   Anesthesia Other Findings   Reproductive/Obstetrics                            Anesthesia Physical Anesthesia Plan  ASA: III  Anesthesia Plan: General   Post-op Pain Management:    Induction: Intravenous, Rapid sequence and Cricoid pressure planned  Airway Management Planned: Oral ETT  Additional Equipment:   Intra-op Plan:   Post-operative Plan: Extubation in OR  Informed Consent: I have reviewed the patients History and Physical, chart, labs and discussed the procedure including the risks, benefits and alternatives for the proposed anesthesia with the patient or authorized representative who has indicated his/her understanding and acceptance.     Plan Discussed with: CRNA and Surgeon  Anesthesia Plan Comments:         Anesthesia Quick Evaluation

## 2014-05-25 ENCOUNTER — Encounter (HOSPITAL_COMMUNITY): Payer: Self-pay | Admitting: Orthopaedic Surgery

## 2014-05-25 ENCOUNTER — Inpatient Hospital Stay (HOSPITAL_COMMUNITY): Payer: Medicare Other

## 2014-05-25 LAB — URINALYSIS, ROUTINE W REFLEX MICROSCOPIC
BILIRUBIN URINE: NEGATIVE
Glucose, UA: NEGATIVE mg/dL
HGB URINE DIPSTICK: NEGATIVE
Ketones, ur: NEGATIVE mg/dL
Leukocytes, UA: NEGATIVE
Nitrite: NEGATIVE
PH: 5 (ref 5.0–8.0)
Protein, ur: NEGATIVE mg/dL
SPECIFIC GRAVITY, URINE: 1.022 (ref 1.005–1.030)
Urobilinogen, UA: 0.2 mg/dL (ref 0.0–1.0)

## 2014-05-25 NOTE — Progress Notes (Signed)
   Subjective:  Patient reports pain as mild.  Objective:   VITALS:   Filed Vitals:   05/24/14 1321 05/24/14 1509 05/24/14 1938 05/25/14 0528  BP:   140/52 125/47  Pulse: 82  91 81  Temp: 99.2 F (37.3 C)  97.9 F (36.6 C) 97.2 F (36.2 C)  TempSrc: Axillary  Oral   Resp: 16  16 16   Height:      Weight:      SpO2: 95% 96% 91% 96%    Foot wwp Ex fix in place 2+ DP pulse   Lab Results  Component Value Date   WBC 13.4* 05/24/2014   HGB 13.0 05/24/2014   HCT 38.4 05/24/2014   MCV 94.8 05/24/2014   PLT 210 05/24/2014     Assessment/Plan:  1 Day Post-Op   - pin site care BID - NWB, up with PT - OR Wednesday for definitive fixation of ankle  [name], [name] 05/25/2014, 8:39 AM [phone]

## 2014-05-25 NOTE — Progress Notes (Signed)
Patient ID: [name], female   DOB: 01/22/1919, 81 y.o.   MRN: [id number] [facility] PROGRESS NOTE  [name] BJY:[id number] DOB: 03/17/1919 DOA: 05/23/2014 PCP: Florentina JennyRIPP, HENRY, MD  Brief narrative:    81 y.o. female with a past medical history significant for complete heart block s/p pacemaker placement, atrial fibrillation, neuropathy, from ILF who [facility] status post fall from chair. Apparently, pt fell backwards while sitting on the chair and was unable to get up. X ray studies on admission revealed left ankle dislocation and fracture.   Pt is underwent external fixation left ankle fracture and debridement of bone associated with open fracture dislocation, left ankle 05/24/14.  Barrier to discharge:  to OR on Wednesday, 5/11 for definite fixation of ankle. PT eval in progress. Anticipate D/C date Thursday 05/27/2104.     Assessment/Plan:    Principal Problem: Fall / left ankle fracture - Pt is status post external fixation left ankle fracture and debridement of bone associated with open fracture dislocation, left ankle done 05/24/14. - Ortho plans to take her to OR 05/27/2014 for definite ankle fixation. - For now pin site care BID, NWB LLE. - PT eval - in progress - Pain controlled with oxycodone 5 mg every 4 hours as needed for moderate pain and Dilaudid 1 mg IV every 3 hours as needed for severe pain.   Active Problems:   Atrial fibrillation / Pacemaker  - CHADS vasc score at least 4  - Patient is not on anticoagulation because of risk of falls. Currently she is on subcutaneous heparin for DVT prophylaxis. - Heart rate controlled.    Leukocytosis - Likely reactive secondary to fall. - Has received preop cefazolin x once     Essential hypertension - Blood pressure stable, 125/47. She's not on antihypertensives at this time.  DVT Prophylaxis  - Heparin subQ ordered while patient in hospital   Code Status: Full.  Family Communication:  Family not at the  bedside Disposition Plan: Patient is not stable for discharge at this time. She will have to work with physical therapy to determine if she will go back to independent living facility or may require skilled nursing facility. Plan is to go to OR on Wednesday for definite ankle fixation.  IV access:  Peripheral IV  Procedures and diagnostic studies:    Dg Ankle 2 Views Left 05/24/2014 Operative placement of hardware for external fixation.     Ct Head Wo Contrast 05/24/2014   1. No acute intracranial process. 2. Remote left frontal lobe infarct. 3. Generalized cerebral atrophy with chronic microvascular ischemic disease.  CT CERVICAL SPINE:  1. No acute traumatic injury within the cervical spine. 2. Advanced multilevel degenerative disc disease as above.     Ct Cervical Spine Wo Contrast 05/24/2014  CT BRAIN:  1. No acute intracranial process. 2. Remote left frontal lobe infarct. 3. Generalized cerebral atrophy with chronic microvascular ischemic disease.  CT CERVICAL SPINE:  1. No acute traumatic injury within the cervical spine. 2. Advanced multilevel degenerative disc disease as above.    Ct Ankle Left Wo Contrast 05/24/2014  1. External fixation of fractures of the left ankle. There is an oblique comminuted fracture of the distal fibula, mildly displaced, with the major distal fracture component displacing laterally by 6 mm. There is a transverse fracture across the base of the medial malleolus, nondisplaced and a small sliver type fracture of the posterior lateral tibia, minimally displaced. 2. Ankle mortise is normally spaced and aligned. 3.  There is soft tissue air in edema presumably from the recent wound debridement. No evidence of an abscess. 4. No ankle joint effusion. 5. No CT evidence of osteomyelitis.     Dg Chest Port 1 View 05/24/2014   No active disease.     Dg Ankle Left Port 05/24/2014  Very limited study, demonstrating a fracture dislocation about the left ankle.     Dg C-arm 1-60 Min  05/24/2014   Operative placement of hardware for external fixation.     External fixation left ankle fracture. 05/24/2014.   Medical Consultants:  Orthopedic surgery   Other Consultants:  Physical therapy  IAnti-Infectives:   None    Manson PasseyEVINE, Dustie Brittle, MD  [facility] Pager [phone]  Time spent in minutes: 25 minutes  If 7PM-7AM, please contact night-coverage [email or web address] Password [email or web address] 05/25/2014, 9:54 AM   LOS: 1 day    HPI/Subjective: No acute overnight events. Patient reports continuous pain in left leg.  Objective: Filed Vitals:   05/24/14 1321 05/24/14 1509 05/24/14 1938 05/25/14 0528  BP:   140/52 125/47  Pulse: 82  91 81  Temp: 99.2 F (37.3 C)  97.9 F (36.6 C) 97.2 F (36.2 C)  TempSrc: Axillary  Oral   Resp: 16  16 16   Height:      Weight:      SpO2: 95% 96% 91% 96%    Intake/Output Summary (Last 24 hours) at 05/25/14 0954 Last data filed at 05/25/14 0300  Gross per 24 hour  Intake    243 ml  Output    300 ml  Net    -57 ml    Exam:   General:  Pt is not in acute distress  Cardiovascular: Rate controlled, appreciate S1-S2  Respiratory: No wheezing or rhonchi  Abdomen: Nontender abdomen, non-distended, appreciate bowel sounds  Extremities: Pulses palpable, left foot wrapped  Neuro: No focal deficits  Data Reviewed: Basic Metabolic Panel:  Recent Labs Lab 05/24/14 0025 05/24/14 0750  NA 139 140  K 4.3 4.5  CL 104 104  CO2 22 26  GLUCOSE 158* 132*  BUN 22* 22*  CREATININE 0.78 0.79  CALCIUM 9.0 9.1   Liver Function Tests: No results for input(s): AST, ALT, ALKPHOS, BILITOT, PROT, ALBUMIN in the last 168 hours. No results for input(s): LIPASE, AMYLASE in the last 168 hours. No results for input(s): AMMONIA in the last 168 hours. CBC:  Recent Labs Lab 05/24/14 0025 05/24/14 0750  WBC 13.0* 13.4*  NEUTROABS 11.5*  --   HGB 13.0 13.0  HCT 38.5 38.4  MCV 93.9 94.8  PLT 224 210   Cardiac Enzymes:  Recent Labs Lab  05/24/14 0025  CKTOTAL 572*   BNP: Invalid input(s): POCBNP CBG: No results for input(s): GLUCAP in the last 168 hours.  No results found for this or any previous visit (from the past 240 hour(s)).   Scheduled Meds: . heparin subcutaneous  5,000 Units Subcutaneous 3 times per day  . sodium chloride  3 mL Intravenous Q12H   Continuous Infusions: . sodium chloride 50 mL/hr at 05/25/14 0319

## 2014-05-25 NOTE — Evaluation (Signed)
Physical Therapy Evaluation Patient Details Name: [name] RoughJoan Toohey MRN: [id number] DOB: 08/13/1919 Today's Date: 05/25/2014   History of Present Illness  Patient is a 81 y/o female admitted s/p fall from a chair. Xray revealed left ankle dislocation and fracture. Pt now s/p external fixation left ankle fracture and debridement of bone associated with open fracture dislocation 5/8. OR Wed 5/11 for definite fixation of ankle. PMH of complete heart block s/p pacemaker placement, A-fib, neuropathy and dementia.  Clinical Impression  Patient presents with increased pain with all movement and weakness limiting mobility. Pt with poor tolerance for activity and pain. Total A of 2 for bed mobility with increased time as pt self limiting. Poor tolerance for sitting EOB. Explained importance of sitting up esp post surgery. OR planned for Wed 5/11. Pt from ILF and requires assist for ADLs at baseline. Pt will need St SNF at discharge to improve transfers and mobility and ease burden of care. Will continue to follow post surgery on Wed.    Follow Up Recommendations SNF;Supervision/Assistance - 24 hour    Equipment Recommendations  Other (comment) (TBD)    Recommendations for Other Services       Precautions / Restrictions Precautions Precautions: Fall Required Braces or Orthoses: Other Brace/Splint Other Brace/Splint: external fixator left ankle Restrictions Weight Bearing Restrictions: Yes LLE Weight Bearing: Non weight bearing      Mobility  Bed Mobility Overal bed mobility: Needs Assistance Bed Mobility: Supine to Sit;Sit to Supine     Supine to sit: Total assist;+2 for physical assistance;HOB elevated Sit to supine: Total assist;+2 for physical assistance;HOB elevated   General bed mobility comments: Pt not able to assist with transfer. Used sheet to mobilize LLE to EOB. Not able to use UEs due to pain. Increased pain with all movement.  Very anxious getting to EOB- cues for deep  breathing.  Transfers Overall transfer level:  (Deferred secondary to poor sitting tolerance. )                  Ambulation/Gait                Stairs            Wheelchair Mobility    Modified Rankin (Stroke Patients Only)       Balance Overall balance assessment: Needs assistance Sitting-balance support: Feet supported;Bilateral upper extremity supported Sitting balance-Leahy Scale: Fair Sitting balance - Comments: Tolerated static/dynamic sitting EOB Mod A initially progressing to Min guard assist. Fatigued easily. Sat EOB ~5 minutes. Postural control: Posterior lean                                   Pertinent Vitals/Pain Pain Assessment: Faces Faces Pain Scale: Hurts whole lot Pain Location: back and LLE with movement Pain Descriptors / Indicators: Sore;Sharp;Aching;Grimacing;Guarding Pain Intervention(s): Limited activity within patient's tolerance;Monitored during session;Repositioned;Patient requesting pain meds-RN notified    Home Living Family/patient expects to be discharged to:: Skilled nursing facility                      Prior Function Level of Independence: Needs assistance   Gait / Transfers Assistance Needed: Pt walking short distances with RW PTA.   ADL's / Homemaking Assistance Needed: Caregiver assisted with ADLs 3 days/week.        Hand Dominance        Extremity/Trunk Assessment   Upper Extremity Assessment: Defer to OT  evaluation           Lower Extremity Assessment: LLE deficits/detail;RLE deficits/detail RLE Deficits / Details: Limited AROM hip, knee secondary to soreness.  LLE Deficits / Details: Able to wiggle toes and perform LAQ. Increased pain with movement. Swelling present.     Communication   Communication: HOH  Cognition Arousal/Alertness: Awake/alert Behavior During Therapy: WFL for tasks assessed/performed Overall Cognitive Status: History of cognitive impairments - at  baseline Area of Impairment: Orientation Orientation Level: Disoriented to;Time                  General Comments General comments (skin integrity, edema, etc.): Pt's daughter present in room during PT evaluation.    Exercises General Exercises - Lower Extremity Ankle Circles/Pumps: Right;10 reps;Seated Long Arc Quad: Both;5 reps;Seated      Assessment/Plan    PT Assessment Patient needs continued PT services  PT Diagnosis Acute pain;Generalized weakness   PT Problem List Decreased strength;Pain;Cardiopulmonary status limiting activity;Decreased range of motion;Impaired sensation;Decreased cognition;Decreased balance;Decreased mobility;Decreased activity tolerance  PT Treatment Interventions Balance training;Functional mobility training;Patient/family education;Therapeutic activities;Therapeutic exercise;Wheelchair mobility training;DME instruction   PT Goals (Current goals can be found in the Care Plan section) Acute Rehab PT Goals Patient Stated Goal: none stated PT Goal Formulation: With patient Time For Goal Achievement: 06/08/14 Potential to Achieve Goals: Fair    Frequency Min 3X/week   Barriers to discharge Decreased caregiver support Pt [place] in [place]F [place].    Co-evaluation               End of Session Equipment Utilized During Treatment: Other (comment) (external fixator) Activity Tolerance: Patient limited by pain Patient left: in bed;with call bell/phone within reach;with bed alarm set;with family/visitor present Nurse Communication: Mobility status;Need for lift equipment         Time: 1005-1037 PT Time Calculation (min) (ACUTE ONLY): 32 min   Charges:   PT Evaluation $Initial PT Evaluation Tier I: 1 Procedure PT Treatments $Therapeutic Activity: 8-22 mins   PT G CodesAlvie Heidelberg:        Folan, Jaysha Lasure A 05/25/2014, 10:48 AM  Mylo RedShauna Jaymien Landin, PT, DPT [phone]

## 2014-05-25 NOTE — Progress Notes (Signed)
OT NOTE  RN STAFF  Please check foot plate splint once a shift ( remove splint by sliding upward to check skin ) to assess for: * pain * redness *swelling * pressure point  If any symptoms above present remove splint for 15 minutes. If symptoms continue - keep the splint removed and notify OT staff [phone] immediately.   Keep the L LE elevated at all times on pillows / towels.  TIme spent: 11-11:20 AM OT fabricated foot plate splint with 4 inch straps to provide 90 degree ankle flexion at this time.Splint is to remain on at all times. Pt pending surgery Wednesday 05/27/14  [name] FlowJones, [name]   OTR/L Pager: [phone] Office: [phone] .

## 2014-05-26 DIAGNOSIS — W19XXXS Unspecified fall, sequela: Secondary | ICD-10-CM

## 2014-05-26 DIAGNOSIS — S82842B Displaced bimalleolar fracture of left lower leg, initial encounter for open fracture type I or II: Secondary | ICD-10-CM

## 2014-05-26 DIAGNOSIS — S82842D Displaced bimalleolar fracture of left lower leg, subsequent encounter for closed fracture with routine healing: Secondary | ICD-10-CM

## 2014-05-26 LAB — CBC
HEMATOCRIT: 29.9 % — AB (ref 36.0–46.0)
Hemoglobin: 10.1 g/dL — ABNORMAL LOW (ref 12.0–15.0)
MCH: 32.9 pg (ref 26.0–34.0)
MCHC: 33.8 g/dL (ref 30.0–36.0)
MCV: 97.4 fL (ref 78.0–100.0)
PLATELETS: 187 10*3/uL (ref 150–400)
RBC: 3.07 MIL/uL — ABNORMAL LOW (ref 3.87–5.11)
RDW: 14 % (ref 11.5–15.5)
WBC: 9 10*3/uL (ref 4.0–10.5)

## 2014-05-26 LAB — URINE CULTURE
CULTURE: NO GROWTH
Colony Count: NO GROWTH

## 2014-05-26 LAB — BASIC METABOLIC PANEL
Anion gap: 9 (ref 5–15)
BUN: 19 mg/dL (ref 6–20)
CO2: 24 mmol/L (ref 22–32)
CREATININE: 0.65 mg/dL (ref 0.44–1.00)
Calcium: 8 mg/dL — ABNORMAL LOW (ref 8.9–10.3)
Chloride: 105 mmol/L (ref 101–111)
GFR calc Af Amer: >60 mL/min (ref >60)
Glucose, Bld: 101 mg/dL — ABNORMAL HIGH (ref 70–99)
POTASSIUM: 3.7 mmol/L (ref 3.5–5.1)
Sodium: 138 mmol/L (ref 135–145)

## 2014-05-26 NOTE — Progress Notes (Signed)
Patient is stable from ortho standpoint.  Plan is to return to OR tomorrow for definitive fixation of left ankle.  Mayra ReelN. Michael Xu, MD [facility] [phone] 7:45 AM

## 2014-05-26 NOTE — Progress Notes (Signed)
[facility]                                                                              Patient Demographics  [name], is a 81 y.o. female, DOB - 17-Jun-1919, ZOX:[id number]  Admit date - 05/23/2014   Admitting Physician Ron Parker, MD  Outpatient Primary MD for the patient is [name], MD  LOS - 2   Chief Complaint  Patient presents with  . Fall    open fracture to left lower leg       Brief HPI   81 y.o. female with a past medical history significant for complete heart block s/p pacemaker placement, atrial fibrillation, neuropathy, from ILF who presented to [facility] ED status post fall from chair. Apparently, pt fell backwards while sitting on the chair and was unable to get up. X ray studies on admission revealed left ankle dislocation and fracture.  Patient underwent external fixation left ankle fracture and debridement of bone associated with open fracture dislocation, left ankle 05/24/14. Plan to return to or tomorrow 5/11 for internal fixation of the left ankle.    Assessment & Plan    Principal Problem: Fall / left ankle fracture - Pt is s/p external fixation left ankle fracture and debridement of bone associated with open fracture dislocation, left ankle done 05/24/14.  Ortho plans to take her to OR 05/27/2014 for definite ankle fixation. - For now pin site care BID, NWB LLE. - PT eval -> SNF - Pain controlled    Active Problems:  Atrial fibrillation / Pacemaker  - CHADS vasc score 4, not on anticoagulation because of risk of falls. Currently she is on subcutaneous heparin for DVT prophylaxis. - Heart rate controlled.  Leukocytosis- resolved - Likely reactive secondary to fall. - Has received preop cefazolin x once , UA negative for UTI   Essential hypertension - BP stable  DVT Prophylaxis heparin subcutaneous  Code Status: Full code  Family Communication: Discussed in detail with the patient, all imaging results, lab results  explained to the patient and her daughter at the bedside.   Disposition Plan: Likely to skilled nursing facility when stable  Time Spent in minutes   25 minutes  Procedures  External fixation of the left ankle fracture on 5/8 Debridement of the bone associated with open fracture dislocation, left ankle and complex wound repair of left medial ankle wound on 5/8  Consults   Orthopedics, Dr Roda Shutters  DVT Prophylaxis  heparin subcutaneous  Medications  Scheduled Meds: . heparin subcutaneous  5,000 Units Subcutaneous 3 times per day  . sodium chloride  3 mL Intravenous Q12H   Continuous Infusions: . sodium chloride 50 mL/hr at 05/26/14 0700   PRN Meds:.acetaminophen **OR** acetaminophen, alum & mag hydroxide-simeth, HYDROmorphone (DILAUDID) injection, ondansetron **OR** ondansetron (ZOFRAN) IV, oxyCODONE   Antibiotics   Anti-infectives    Start     Dose/Rate Route Frequency Ordered Stop   05/23/14 2345  ceFAZolin (ANCEF) IVPB 1 g/50 mL premix     1 g 100 mL/hr over 30 Minutes Intravenous  Once 05/23/14 2340 05/24/14 0019        Subjective:  [name] was seen and examined today. Denies any specific complaints, pain controlled, denies dizziness, chest pain, shortness of breath, abdominal pain, N/V/D/C, new weakness, numbess, tingling. No acute events overnight.    Objective:   Blood pressure 141/61, pulse 77, temperature 98.1 F (36.7 C), temperature source Oral, resp. rate 20, height 5\' 7"  (1.702 m), weight 99.474 kg (219 lb 4.8 oz), SpO2 97 %.  Wt Readings from Last 3 Encounters:  05/24/14 99.474 kg (219 lb 4.8 oz)  09/11/11 84.369 kg (186 lb)  08/02/11 87.726 kg (193 lb 6.4 oz)     Intake/Output Summary (Last 24 hours) at 05/26/14 1133 Last data filed at 05/26/14 0700  Gross per 24 hour  Intake    840 ml  Output   1100 ml  Net   -260 ml    Exam  General: Alert and oriented x 3, NAD  HEENT:  PERRLA, EOMI, Anicteic Sclera, mucous membranes moist.   Neck:  Supple, no JVD, no masses  CVS: S1 S2 auscultated, no rubs, murmurs or gallops. Regular rate and rhythm.  Respiratory: Clear to auscultation bilaterally, no wheezing, rales or rhonchi  Abdomen: Soft, nontender, nondistended, + bowel sounds  Ext: no cyanosis clubbing, left lower leg in external fixator  Neuro: AAOx3, Cr N's II- XII. Strength 5/5 upper extremities bilaterally  Skin: No rashes  Psych: Normal affect and demeanor, alert and oriented x3    Data Review   Micro Results No results found for this or any previous visit (from the past 240 hour(s)).  Radiology Reports Dg Shoulder Right  05/25/2014   CLINICAL DATA:  Acute right shoulder pain after recent fall. Initial encounter.  EXAM: RIGHT SHOULDER - 2+ VIEW  COMPARISON:  None.  FINDINGS: No fracture or dislocation is noted. Visualized ribs appear normal. Severe narrowing of subacromial space is noted consistent with rotator cuff injury. Degenerative change of right glenohumeral and acromioclavicular joints is noted as well.  IMPRESSION: Degenerative changes of right glenohumeral and acromioclavicular joints is noted as well as severe subacromial joint space narrowing suggesting rotator cuff injury. No acute abnormality seen in the right shoulder.   Electronically Signed   By: Lupita RaiderJames  Green Jr, M.D.   On: 05/25/2014 09:32   Dg Ankle 2 Views Left  05/24/2014   CLINICAL DATA:  Left ankle fracture/dislocation.  EXAM: DG C-ARM 61-120 MIN; LEFT ANKLE - 2 VIEW  : COMPARISON:  Left ankle radiographs obtained earlier today.  FINDINGS: 8 C arm views of the left ankle demonstrate placement of hardware for external fixation of the previously described a ankle fracture/dislocation.  IMPRESSION: Operative placement of hardware for external fixation.   Electronically Signed   By: Beckie SaltsSteven  Reid M.D.   On: 05/24/2014 07:12   Ct Head Wo Contrast  05/24/2014   CLINICAL DATA:  Initial evaluation for acute trauma, fall.  EXAM: CT HEAD WITHOUT CONTRAST  CT  CERVICAL SPINE WITHOUT CONTRAST  TECHNIQUE: Multidetector CT imaging of the head and cervical spine was performed following the standard protocol without intravenous contrast. Multiplanar CT image reconstructions of the cervical spine were also generated.  COMPARISON:  None.  FINDINGS: CT HEAD FINDINGS  Diffuse prominence of the CSF containing spaces is compatible with generalized cerebral atrophy. Patchy hypodensity within the periventricular and deep white matter both cerebral hemispheres most consistent with chronic small vessel ischemic disease. Remote left frontal infarct noted.  No acute large vessel territory infarct. No intracranial hemorrhage. No extra-axial fluid collection.  No mass lesion or midline shift.  No hydrocephalus.  Scalp soft tissues within normal limits. No acute abnormality about the orbits.  Calvarium intact.  Paranasal sinuses are clear.  No mastoid effusion.  CT CERVICAL SPINE FINDINGS  Vertebral bodies are normally aligned with preservation of the normal cervical lordosis. Vertebral body heights are maintained. No acute fracture or listhesis.  Advanced degenerative osteoarthrosis present about the C1-2 articulation as well as at the atlanto occipital articulation. Prominent bridging anterior osteophytes extend from C4 through T2. Posterior osteophytic spurring present at C3-4.  No acute soft tissue abnormality within the neck. Vascular calcifications present about the carotid bifurcations. Visualized lungs are clear.  IMPRESSION: CT BRAIN:  1. No acute intracranial process. 2. Remote left frontal lobe infarct. 3. Generalized cerebral atrophy with chronic microvascular ischemic disease.  CT CERVICAL SPINE:  1. No acute traumatic injury within the cervical spine. 2. Advanced multilevel degenerative disc disease as above.   Electronically Signed   By: Rise Mu M.D.   On: 05/24/2014 01:47   Ct Cervical Spine Wo Contrast  05/24/2014   CLINICAL DATA:  Initial evaluation for acute  trauma, fall.  EXAM: CT HEAD WITHOUT CONTRAST  CT CERVICAL SPINE WITHOUT CONTRAST  TECHNIQUE: Multidetector CT imaging of the head and cervical spine was performed following the standard protocol without intravenous contrast. Multiplanar CT image reconstructions of the cervical spine were also generated.  COMPARISON:  None.  FINDINGS: CT HEAD FINDINGS  Diffuse prominence of the CSF containing spaces is compatible with generalized cerebral atrophy. Patchy hypodensity within the periventricular and deep white matter both cerebral hemispheres most consistent with chronic small vessel ischemic disease. Remote left frontal infarct noted.  No acute large vessel territory infarct. No intracranial hemorrhage. No extra-axial fluid collection.  No mass lesion or midline shift.  No hydrocephalus.  Scalp soft tissues within normal limits. No acute abnormality about the orbits.  Calvarium intact.  Paranasal sinuses are clear.  No mastoid effusion.  CT CERVICAL SPINE FINDINGS  Vertebral bodies are normally aligned with preservation of the normal cervical lordosis. Vertebral body heights are maintained. No acute fracture or listhesis.  Advanced degenerative osteoarthrosis present about the C1-2 articulation as well as at the atlanto occipital articulation. Prominent bridging anterior osteophytes extend from C4 through T2. Posterior osteophytic spurring present at C3-4.  No acute soft tissue abnormality within the neck. Vascular calcifications present about the carotid bifurcations. Visualized lungs are clear.  IMPRESSION: CT BRAIN:  1. No acute intracranial process. 2. Remote left frontal lobe infarct. 3. Generalized cerebral atrophy with chronic microvascular ischemic disease.  CT CERVICAL SPINE:  1. No acute traumatic injury within the cervical spine. 2. Advanced multilevel degenerative disc disease as above.   Electronically Signed   By: Rise Mu M.D.   On: 05/24/2014 01:47   Ct Ankle Left Wo Contrast  05/24/2014    CLINICAL DATA:  S/P External fixation left ankle fractureDebridement of bone associated with open fracture dislocation, left ankleComplex wound repair of left medial ankleHx of bilateral knee and RT leg surgeries Hx of arthritis per pt  EXAM: CT OF THE LEFT ANKLE WITHOUT CONTRAST  TECHNIQUE: Multidetector CT imaging of the left ankle was performed according to the standard protocol. Multiplanar CT image reconstructions were also generated.  COMPARISON:  None.  FINDINGS: There are fractures of the distal tibia and fibula. There is a transverse fracture across the base the medial malleolus. An oblique comminuted fracture crosses the distal fibula from the posterior lateral meta diaphysis to the anterior metaphysis. The fibular  fracture is mildly displaced laterally, with the major fracture components separated by 6 mm. No significant displacement of the medial malleolar fracture component. There is an additional fracture along the posterior lateral margin of the distal tibia with a sliver of bone displacing laterally by 4 mm.  The ankle mortise is normally spaced and aligned.  The fractures are held in position with an external stabilizer with screws extending through the mid tibial shaft and to the body of the calcaneus.  Bones are diffusely demineralized. There is no convincing bone resorption to suggest osteomyelitis.  Soft tissue air is seen along the anterior and anterior lateral aspect of the ankle joint, along several of the extensor tendons sheaths, and along the medial aspect of the ankle. Air tracks superiorly along the lateral and medial aspects of the ankle and calf both along the ankle musculature and along the superficial muscle fascia. There is mild edema in the subcutaneous soft tissues of the ankle, but no formed fluid collection to suggest an abscess. There is also no ankle joint effusion to suggest a septic arthritis.  IMPRESSION: 1. External fixation of fractures of the left ankle. There is an  oblique comminuted fracture of the distal fibula, mildly displaced, with the major distal fracture component displacing laterally by 6 mm. There is a transverse fracture across the base of the medial malleolus, nondisplaced and a small sliver type fracture of the posterior lateral tibia, minimally displaced. 2. Ankle mortise is normally spaced and aligned. 3. There is soft tissue air in edema presumably from the recent wound debridement. No evidence of an abscess. 4. No ankle joint effusion. 5. No CT evidence of osteomyelitis.   Electronically Signed   By: Amie Portland M.D.   On: 05/24/2014 10:45   Dg Chest Port 1 View  05/24/2014   CLINICAL DATA:  Fall.  Left ankle deformity.  EXAM: PORTABLE CHEST - 1 VIEW  COMPARISON:  08/02/2011  FINDINGS: Pacemaker remains in place. Cardiac silhouette is upper limits of normal in size. The lungs are mildly hypoinflated without evidence of airspace consolidation, edema, pleural effusion, or pneumothorax. Right upper quadrant abdominal surgical clips are noted. No acute osseous abnormality is identified. Evidence chronic left rotator cuff tear.  IMPRESSION: No active disease.   Electronically Signed   By: Sebastian Ache   On: 05/24/2014 01:12   Dg Ankle Left Port  05/24/2014   CLINICAL DATA:  Left ankle pain and deformity after fall  EXAM: PORTABLE LEFT ANKLE - 2 VIEW  COMPARISON:  None.  FINDINGS: Two portable views of the left ankle demonstrate a fracture dislocation. The dislocation is probably lateral and posterior, but the exam is quite limited. Comminuted fragments are present about the distal fibula and tibia.  IMPRESSION: Very limited study, demonstrating a fracture dislocation about the left ankle.   Electronically Signed   By: Ellery Plunk M.D.   On: 05/24/2014 01:12   Dg Humerus Right  05/25/2014   CLINICAL DATA:  Fall.  Pain.  EXAM: RIGHT HUMERUS - 2+ VIEW  COMPARISON:  08/02/2011.  FINDINGS: Prominent degenerative changes right shoulder. Severe  acromioclavicular glenohumeral degenerative change. Right shoulder is high-riding consistent with rotator cuff tear. No acute bony abnormality identified.  IMPRESSION: Severe acromioclavicular glenohumeral degenerative change. High-riding right shoulder consistent with rotator cuff tear. No acute abnormality. Similar findings noted on prior exam.   Electronically Signed   By: Maisie Fus  Register   On: 05/25/2014 09:31   Dg C-arm 1-60 Min  05/24/2014  CLINICAL DATA:  Left ankle fracture/dislocation.  EXAM: DG C-ARM 61-120 MIN; LEFT ANKLE - 2 VIEW  : COMPARISON:  Left ankle radiographs obtained earlier today.  FINDINGS: 8 C arm views of the left ankle demonstrate placement of hardware for external fixation of the previously described a ankle fracture/dislocation.  IMPRESSION: Operative placement of hardware for external fixation.   Electronically Signed   By: Beckie SaltsSteven  Reid M.D.   On: 05/24/2014 07:12    CBC  Recent Labs Lab 05/24/14 0025 05/24/14 0750 05/26/14 0640  WBC 13.0* 13.4* 9.0  HGB 13.0 13.0 10.1*  HCT 38.5 38.4 29.9*  PLT 224 210 187  MCV 93.9 94.8 97.4  MCH 31.7 32.1 32.9  MCHC 33.8 33.9 33.8  RDW 13.7 13.8 14.0  LYMPHSABS 0.6*  --   --   MONOABS 0.9  --   --   EOSABS 0.0  --   --   BASOSABS 0.0  --   --     Chemistries   Recent Labs Lab 05/24/14 0025 05/24/14 0750 05/26/14 0640  NA 139 140 138  K 4.3 4.5 3.7  CL 104 104 105  CO2 22 26 24   GLUCOSE 158* 132* 101*  BUN 22* 22* 19  CREATININE 0.78 0.79 0.65  CALCIUM 9.0 9.1 8.0*   ------------------------------------------------------------------------------------------------------------------ estimated creatinine clearance is 54.4 mL/min (by C-G formula based on Cr of 0.65). ------------------------------------------------------------------------------------------------------------------ No results for input(s): HGBA1C in the last 72  hours. ------------------------------------------------------------------------------------------------------------------ No results for input(s): CHOL, HDL, LDLCALC, TRIG, CHOLHDL, LDLDIRECT in the last 72 hours. ------------------------------------------------------------------------------------------------------------------ No results for input(s): TSH, T4TOTAL, T3FREE, THYROIDAB in the last 72 hours.  Invalid input(s): FREET3 ------------------------------------------------------------------------------------------------------------------ No results for input(s): VITAMINB12, FOLATE, FERRITIN, TIBC, IRON, RETICCTPCT in the last 72 hours.  Coagulation profile No results for input(s): INR, PROTIME in the last 168 hours.  No results for input(s): DDIMER in the last 72 hours.  Cardiac Enzymes No results for input(s): CKMB, TROPONINI, MYOGLOBIN in the last 168 hours.  Invalid input(s): CK ------------------------------------------------------------------------------------------------------------------ Invalid input(s): POCBNP  No results for input(s): GLUCAP in the last 72 hours.   Chiyeko Ferre M.D. [facility] 05/26/2014, 11:33 AM  Pager: [phone]   Between 7am to 7pm - call Pager - [phone]  After 7pm go to [email or web address] - password [email or web address]  Call night coverage person covering after 7pm

## 2014-05-27 ENCOUNTER — Inpatient Hospital Stay (HOSPITAL_COMMUNITY): Payer: Medicare Other | Admitting: Anesthesiology

## 2014-05-27 ENCOUNTER — Encounter (HOSPITAL_COMMUNITY): Payer: Self-pay | Admitting: Anesthesiology

## 2014-05-27 ENCOUNTER — Inpatient Hospital Stay (HOSPITAL_COMMUNITY): Payer: Medicare Other

## 2014-05-27 ENCOUNTER — Encounter (HOSPITAL_COMMUNITY)
Admission: EM | Disposition: A | Discharge status: Skilled Nursing Facility | Payer: Self-pay | Source: Home / Self Care | Attending: Internal Medicine

## 2014-05-27 DIAGNOSIS — S82892S Other fracture of left lower leg, sequela: Secondary | ICD-10-CM

## 2014-05-27 HISTORY — PX: ORIF ANKLE FRACTURE: SHX5408

## 2014-05-27 LAB — CBC
HCT: 31.4 % — ABNORMAL LOW (ref 36.0–46.0)
Hemoglobin: 10.1 g/dL — ABNORMAL LOW (ref 12.0–15.0)
MCH: 31 pg (ref 26.0–34.0)
MCHC: 32.2 g/dL (ref 30.0–36.0)
MCV: 96.3 fL (ref 78.0–100.0)
PLATELETS: 207 10*3/uL (ref 150–400)
RBC: 3.26 MIL/uL — AB (ref 3.87–5.11)
RDW: 13.8 % (ref 11.5–15.5)
WBC: 6.4 10*3/uL (ref 4.0–10.5)

## 2014-05-27 LAB — BASIC METABOLIC PANEL
ANION GAP: 8 (ref 5–15)
BUN: 13 mg/dL (ref 6–20)
CALCIUM: 8 mg/dL — AB (ref 8.9–10.3)
CHLORIDE: 105 mmol/L (ref 101–111)
CO2: 25 mmol/L (ref 22–32)
CREATININE: 0.56 mg/dL (ref 0.44–1.00)
GLUCOSE: 98 mg/dL (ref 70–99)
Potassium: 3.5 mmol/L (ref 3.5–5.1)
Sodium: 138 mmol/L (ref 135–145)

## 2014-05-27 SURGERY — OPEN REDUCTION INTERNAL FIXATION (ORIF) ANKLE FRACTURE
Anesthesia: General | Site: Leg Lower | Laterality: Left

## 2014-05-27 MED ORDER — HYDROCODONE-ACETAMINOPHEN 7.5-325 MG PO TABS: 1.0000 | ORAL_TABLET | Freq: Four times a day (QID) | ORAL | Status: DC | PRN

## 2014-05-27 MED ORDER — FENTANYL CITRATE (PF) 100 MCG/2ML IJ SOLN
INTRAMUSCULAR | Status: DC | PRN
  Administered 2014-05-27: 37.5 ug via INTRAVENOUS
  Administered 2014-05-27: 12.5 ug via INTRAVENOUS
  Administered 2014-05-27: 25 ug via INTRAVENOUS

## 2014-05-27 MED ORDER — LIDOCAINE HCL (CARDIAC) 20 MG/ML IV SOLN
INTRAVENOUS | Status: AC
  Filled 2014-05-27: qty 5

## 2014-05-27 MED ORDER — FENTANYL CITRATE (PF) 250 MCG/5ML IJ SOLN
INTRAMUSCULAR | Status: AC
  Filled 2014-05-27: qty 5

## 2014-05-27 MED ORDER — MEPIVACAINE HCL 1.5 % IJ SOLN
INTRAMUSCULAR | Status: DC | PRN
  Administered 2014-05-27: 20 mL via EPIDURAL

## 2014-05-27 MED ORDER — CEFAZOLIN SODIUM-DEXTROSE 2-3 GM-% IV SOLR
INTRAVENOUS | Status: DC | PRN
  Administered 2014-05-27: 2 g via INTRAVENOUS

## 2014-05-27 MED ORDER — ONDANSETRON HCL 4 MG/2ML IJ SOLN
INTRAMUSCULAR | Status: DC | PRN
  Administered 2014-05-27: 4 mg via INTRAVENOUS

## 2014-05-27 MED ORDER — DEXTROSE 5 % IV SOLN
INTRAVENOUS | Status: DC | PRN
  Administered 2014-05-27: 09:00:00 via INTRAVENOUS

## 2014-05-27 MED ORDER — PROPOFOL 10 MG/ML IV BOLUS
INTRAVENOUS | Status: DC | PRN
  Administered 2014-05-27: 20 mg via INTRAVENOUS
  Administered 2014-05-27 (×2): 50 mg via INTRAVENOUS

## 2014-05-27 MED ORDER — ASPIRIN EC 325 MG PO TBEC
325.0000 mg | DELAYED_RELEASE_TABLET | Freq: Two times a day (BID) | ORAL | Status: DC
  Administered 2014-05-27 – 2014-06-01 (×10): 325 mg via ORAL
  Filled 2014-05-27 (×10): qty 1

## 2014-05-27 MED ORDER — LACTATED RINGERS IV SOLN
INTRAVENOUS | Status: DC | PRN
  Administered 2014-05-27 (×2): via INTRAVENOUS

## 2014-05-27 MED ORDER — MEPERIDINE HCL 25 MG/ML IJ SOLN: 6.2500 mg | INTRAMUSCULAR | Status: DC | PRN

## 2014-05-27 MED ORDER — ONDANSETRON HCL 4 MG/2ML IJ SOLN
INTRAMUSCULAR | Status: AC
  Filled 2014-05-27: qty 2

## 2014-05-27 MED ORDER — MIDAZOLAM HCL 5 MG/5ML IJ SOLN
INTRAMUSCULAR | Status: DC | PRN
  Administered 2014-05-27: 0.5 mg via INTRAVENOUS

## 2014-05-27 MED ORDER — ASPIRIN EC 325 MG PO TBEC: 325.0000 mg | DELAYED_RELEASE_TABLET | Freq: Two times a day (BID) | ORAL | Status: DC

## 2014-05-27 MED ORDER — PHENYLEPHRINE HCL 10 MG/ML IJ SOLN
10.0000 mg | INTRAVENOUS | Status: DC | PRN
  Administered 2014-05-27: 20 ug/min via INTRAVENOUS

## 2014-05-27 MED ORDER — BUPIVACAINE HCL (PF) 0.25 % IJ SOLN
INTRAMUSCULAR | Status: AC
  Filled 2014-05-27: qty 30

## 2014-05-27 MED ORDER — FENTANYL CITRATE (PF) 100 MCG/2ML IJ SOLN: 25.0000 ug | INTRAMUSCULAR | Status: DC | PRN

## 2014-05-27 MED ORDER — PROPOFOL 10 MG/ML IV BOLUS
INTRAVENOUS | Status: AC
  Filled 2014-05-27: qty 20

## 2014-05-27 MED ORDER — BUPIVACAINE-EPINEPHRINE (PF) 0.5% -1:200000 IJ SOLN
INTRAMUSCULAR | Status: DC | PRN
  Administered 2014-05-27: 30 mL

## 2014-05-27 MED ORDER — LIDOCAINE HCL (CARDIAC) 20 MG/ML IV SOLN
INTRAVENOUS | Status: DC | PRN
  Administered 2014-05-27: 50 mg via INTRAVENOUS

## 2014-05-27 MED ORDER — ARTIFICIAL TEARS OP OINT
TOPICAL_OINTMENT | OPHTHALMIC | Status: AC
  Filled 2014-05-27: qty 3.5

## 2014-05-27 MED ORDER — MIDAZOLAM HCL 2 MG/2ML IJ SOLN
INTRAMUSCULAR | Status: AC
  Filled 2014-05-27: qty 2

## 2014-05-27 MED ORDER — PROMETHAZINE HCL 25 MG/ML IJ SOLN: 6.2500 mg | INTRAMUSCULAR | Status: DC | PRN

## 2014-05-27 MED ORDER — CEFAZOLIN SODIUM-DEXTROSE 2-3 GM-% IV SOLR
INTRAVENOUS | Status: AC
  Filled 2014-05-27: qty 50

## 2014-05-27 SURGICAL SUPPLY — 70 items
BANDAGE ELASTIC 4 VELCRO ST LF (GAUZE/BANDAGES/DRESSINGS) IMPLANT
BANDAGE ELASTIC 6 VELCRO ST LF (GAUZE/BANDAGES/DRESSINGS) ×3 IMPLANT
BANDAGE ESMARK 6X9 LF (GAUZE/BANDAGES/DRESSINGS) ×1 IMPLANT
BNDG COHESIVE 4X5 TAN STRL (GAUZE/BANDAGES/DRESSINGS) ×3 IMPLANT
BNDG COHESIVE 6X5 TAN STRL LF (GAUZE/BANDAGES/DRESSINGS) ×3 IMPLANT
BNDG ESMARK 6X9 LF (GAUZE/BANDAGES/DRESSINGS) ×3
CANISTER SUCT 3000ML PPV (MISCELLANEOUS) ×3 IMPLANT
COVER SURGICAL LIGHT HANDLE (MISCELLANEOUS) ×3 IMPLANT
CUFF TOURNIQUET SINGLE 34IN LL (TOURNIQUET CUFF) IMPLANT
CUFF TOURNIQUET SINGLE 44IN (TOURNIQUET CUFF) IMPLANT
DRAPE C-ARM 42X72 X-RAY (DRAPES) ×3 IMPLANT
DRAPE C-ARMOR (DRAPES) ×3 IMPLANT
DRAPE IMP U-DRAPE 54X76 (DRAPES) ×3 IMPLANT
DRAPE INCISE IOBAN 66X45 STRL (DRAPES) ×3 IMPLANT
DRAPE U-SHAPE 47X51 STRL (DRAPES) ×6 IMPLANT
DRILL 2.6X122MM WL AO SHAFT (BIT) ×3 IMPLANT
DRSG PAD ABDOMINAL 8X10 ST (GAUZE/BANDAGES/DRESSINGS) ×3 IMPLANT
DURAPREP 26ML APPLICATOR (WOUND CARE) ×3 IMPLANT
ELECT CAUTERY BLADE 6.4 (BLADE) ×3 IMPLANT
ELECT REM PT RETURN 9FT ADLT (ELECTROSURGICAL) ×3
ELECTRODE REM PT RTRN 9FT ADLT (ELECTROSURGICAL) ×1 IMPLANT
FACESHIELD WRAPAROUND (MASK) ×3 IMPLANT
GAUZE SPONGE 4X4 12PLY STRL (GAUZE/BANDAGES/DRESSINGS) ×3 IMPLANT
GAUZE XEROFORM 5X9 LF (GAUZE/BANDAGES/DRESSINGS) ×3 IMPLANT
GLOVE BIOGEL PI IND STRL 7.0 (GLOVE) ×1 IMPLANT
GLOVE BIOGEL PI INDICATOR 7.0 (GLOVE) ×2
GLOVE NEODERM STRL 7.5 LF PF (GLOVE) ×1 IMPLANT
GLOVE SURG NEODERM 7.5  LF PF (GLOVE) ×2
GLOVE SURG SS PI 7.0 STRL IVOR (GLOVE) ×3 IMPLANT
GLOVE SURG SS PI 8.0 STRL IVOR (GLOVE) ×6 IMPLANT
GLOVE SURG SYN 7.5  E (GLOVE) ×4
GLOVE SURG SYN 7.5 E (GLOVE) ×2 IMPLANT
GOWN STRL REIN XL XLG (GOWN DISPOSABLE) ×3 IMPLANT
K-WIRE 1.4X100 (WIRE) ×9
KIT BASIN OR (CUSTOM PROCEDURE TRAY) ×3 IMPLANT
KIT ROOM TURNOVER OR (KITS) ×3 IMPLANT
KWIRE 1.4X100 (WIRE) ×3 IMPLANT
NEEDLE HYPO 25GX1X1/2 BEV (NEEDLE) IMPLANT
NS IRRIG 1000ML POUR BTL (IV SOLUTION) ×3 IMPLANT
PACK ORTHO EXTREMITY (CUSTOM PROCEDURE TRAY) ×3 IMPLANT
PAD ARMBOARD 7.5X6 YLW CONV (MISCELLANEOUS) ×6 IMPLANT
PAD CAST 3X4 CTTN HI CHSV (CAST SUPPLIES) ×2 IMPLANT
PAD CAST 4YDX4 CTTN HI CHSV (CAST SUPPLIES) ×1 IMPLANT
PADDING CAST COTTON 3X4 STRL (CAST SUPPLIES) ×4
PADDING CAST COTTON 4X4 STRL (CAST SUPPLIES) ×2
PADDING CAST COTTON 6X4 STRL (CAST SUPPLIES) ×3 IMPLANT
PLATE 6H 113MM (Plate) ×3 IMPLANT
PUTTY DBX 1CC (Putty) ×3 IMPLANT
PUTTY DBX 1CC DEPUY (Putty) ×1 IMPLANT
SCREW BONE 18 (Screw) ×6 IMPLANT
SCREW BONE 50MMX3.5MM (Screw) ×3 IMPLANT
SCREW BONE NON-LCKING 3.5X12MM (Screw) ×12 IMPLANT
SCREW CANNULATED 4.0X36MM FT (Screw) ×3 IMPLANT
SCREW CANNULATED 4.0X36MM PT (Screw) ×3 IMPLANT
SCREW LOCK 3.5X14 (Screw) ×3 IMPLANT
SCREW LOCKING 3.5X16MM (Screw) ×6 IMPLANT
SCREW LOCKING 3.5X18MM (Screw) ×3 IMPLANT
SPLINT FIBERGLASS 4X30 (CAST SUPPLIES) ×12 IMPLANT
SPONGE GAUZE 4X4 12PLY STER LF (GAUZE/BANDAGES/DRESSINGS) ×3 IMPLANT
SPONGE LAP 18X18 X RAY DECT (DISPOSABLE) ×9 IMPLANT
SUCTION FRAZIER TIP 10 FR DISP (SUCTIONS) ×3 IMPLANT
SUT ETHILON 3 0 PS 1 (SUTURE) IMPLANT
SUT VIC AB 2-0 CT1 27 (SUTURE) ×4
SUT VIC AB 2-0 CT1 TAPERPNT 27 (SUTURE) ×2 IMPLANT
SYR CONTROL 10ML LL (SYRINGE) IMPLANT
TOWEL OR 17X24 6PK STRL BLUE (TOWEL DISPOSABLE) ×3 IMPLANT
TOWEL OR 17X26 10 PK STRL BLUE (TOWEL DISPOSABLE) ×6 IMPLANT
TUBE CONNECTING 12'X1/4 (SUCTIONS) ×1
TUBE CONNECTING 12X1/4 (SUCTIONS) ×2 IMPLANT
WATER STERILE IRR 1000ML POUR (IV SOLUTION) ×3 IMPLANT

## 2014-05-27 NOTE — Transfer of Care (Signed)
Immediate Anesthesia Transfer of Care Note  Patient: [name] RoughJoan Farinas  Procedure(s) Performed: Procedure(s): OPEN REDUCTION INTERNAL FIXATION (ORIF) LEFT LATERAL MALLEOLUS AND MEDIAL MALLEOLUS, REMOVAL OF  EXTERNAL FIXATOR (Left)  Patient Location: PACU  Anesthesia Type:General  Level of Consciousness: awake, patient cooperative and confused  Airway & Oxygen Therapy: Patient Spontanous Breathing and Patient connected to nasal cannula oxygen  Post-op Assessment: Report given to RN and Patient moving all extremities  Post vital signs: Reviewed and stable  Last Vitals:  Filed Vitals:   05/27/14 0748  BP: 138/107  Pulse: 77  Temp: 37.3 C  Resp:     Complications: No apparent anesthesia complications

## 2014-05-27 NOTE — Anesthesia Postprocedure Evaluation (Signed)
  Anesthesia Post-op Note  Patient: [name]  Procedure(s) Performed: Procedure(s): OPEN REDUCTION INTERNAL FIXATION (ORIF) LEFT LATERAL MALLEOLUS AND MEDIAL MALLEOLUS, REMOVAL OF  EXTERNAL FIXATOR (Left)  Patient Location: PACU  Anesthesia Type:General  Level of Consciousness: awake  Airway and Oxygen Therapy: Patient Spontanous Breathing  Post-op Pain: mild  Post-op Assessment: Post-op Vital signs reviewed  Post-op Vital Signs: Reviewed  Last Vitals:  Filed Vitals:   05/27/14 1145  BP: 145/84  Pulse: 73  Temp: 36.6 C  Resp: 14    Complications: No apparent anesthesia complications

## 2014-05-27 NOTE — Clinical Social Work Note (Signed)
Clinical Social Work Assessment  Patient Details  Name: [name] MRN: [id number] Date of Birth: 05/04/1919  Date of referral:  05/27/14               Reason for consult:  Facility Placement, Discharge Planning                Permission sought to share information with:  Family Supports Permission granted to share information::  Yes, Verbal Permission Granted  Name::     [employer]  Agency::     Relationship::  Daughter, Adult Child  Contact Information:  [phone]  Housing/Transportation Living arrangements for the past 2 months:  Independent Living Facility [facility]([place]) Source of Information:  Adult Children Patient Interpreter Needed:  None Criminal Activity/Legal Involvement Pertinent to Current Situation/Hospitalization:  No - Comment as needed Significant Relationships:  Adult Children Lives with:  Self, Facility Resident Do you feel safe going back to the place where you live?  Yes Need for family participation in patient care:  Yes (Comment)  Care giving concerns:  Patient's daughter expressed no concerns at this time.   Social Worker assessment / plan:  Patient admitted from Independent Living [facility]([place]). Awaiting PT/OT recommendation post-surgery. (Surgery 05/27/2014). Patient's daughter aware of potential for SNF recommendation. Patient's daughter informed CSW patient [facility] and family would prefer for patient to return once medically stable.   Employment status:  Retired Health and safety inspectornsurance information:  Medicare PT Recommendations:  Skilled Nursing Facility Information / Referral to community resources:  Skilled Nursing Facility  Patient/Family's Response to care:  Patient's daughter understanding and agreeable to CSW plan of care.  Patient/Family's Understanding of and Emotional Response to Diagnosis, Current Treatment, and Prognosis:  Patient's daughter understanding and agreeable to CSW plan  of care.  Emotional Assessment Appearance:  Appears stated age Attitude/Demeanor/Rapport:  Other (Patient asleep during assessment. Daughter at bedside.) Affect (typically observed):  Other (Patient asleep during assessment. Daughter at bedside.) Orientation:  Oriented to Self Alcohol / Substance use:  Not Applicable Psych involvement (Current and /or in the community):  No (Comment)  Discharge Needs  Concerns to be addressed:  No discharge needs identified Readmission within the last 30 days:  No Current discharge risk:  None Barriers to Discharge:  No Barriers Identified   Rod MaeVaughn, Nakota Elsen S, LCSW 05/27/2014, 12:53 PM [phone]

## 2014-05-27 NOTE — Anesthesia Preprocedure Evaluation (Addendum)
Anesthesia Evaluation  Patient identified by MRN, date of birth, ID band Patient awake    Reviewed: Allergy & Precautions, NPO status , Patient's Chart, lab work & pertinent test results  Airway Mallampati: II  TM Distance: >3 FB Neck ROM: Full    Dental  (+) Teeth Intact   Pulmonary neg pulmonary ROS,    Pulmonary exam normal       Cardiovascular negative cardio ROS Normal cardiovascular exam+ dysrhythmias Atrial Fibrillation + pacemaker  VDD last interrogated in 2013.  12 lead indicates no pacing.   Neuro/Psych PSYCHIATRIC DISORDERS Takes Valium to sleep at nightnegative neurological ROS     GI/Hepatic negative GI ROS, Neg liver ROS,   Endo/Other  negative endocrine ROS  Renal/GU negative Renal ROS     Musculoskeletal  (+) Arthritis -,   Abdominal   Peds  Hematology negative hematology ROS (+)   Anesthesia Other Findings Neck very stiff  Reproductive/Obstetrics negative OB ROS                           Anesthesia Physical  Anesthesia Plan  ASA: III  Anesthesia Plan: General   Post-op Pain Management:    Induction: Intravenous  Airway Management Planned: LMA  Additional Equipment: None  Intra-op Plan:   Post-operative Plan: Extubation in OR  Informed Consent: I have reviewed the patients History and Physical, chart, labs and discussed the procedure including the risks, benefits and alternatives for the proposed anesthesia with the patient or authorized representative who has indicated his/her understanding and acceptance.   Dental advisory given  Plan Discussed with: CRNA  Anesthesia Plan Comments:         Anesthesia Quick Evaluation

## 2014-05-27 NOTE — Anesthesia Procedure Notes (Addendum)
Anesthesia Regional Block:  Femoral nerve block  Pre-Anesthetic Checklist: ,, timeout performed, Correct Patient, Correct Site, Correct Laterality, Correct Procedure, Correct Position, site marked, Risks and benefits discussed, Surgical consent,  Pre-op evaluation,  Post-op pain management  Laterality: Left  Prep: chloraprep       Needles:  Injection technique: Single-shot  Needle Type: Stimulator Needle - 80     Needle Length: 9cm 9 cm Needle Gauge: 22 and 22 G  Needle insertion depth: 6 cm   Additional Needles:  Procedures: ultrasound guided (picture in chart) and nerve stimulator Femoral nerve block  Nerve Stimulator or Paresthesia:  Response: Patellar snap, 0.8 mA,   Additional Responses:   Narrative:  Injection made incrementally with aspirations every 5 mL.  Performed by: Personally  Anesthesiologist: Lewie LoronGERMEROTH, JOHN  Additional Notes: BP cuff, EKG monitors applied. Sedation begun. Femoral artery palpated for location of nerve. After nerve location verified with U/S, anesthetic injected incrementally, slowly, and after negative aspirations under direct u/s guidance. Good perineural spread. Patient tolerated well.   Anesthesia Regional Block:  Popliteal block  Pre-Anesthetic Checklist: ,, timeout performed, Correct Patient, Correct Site, Correct Laterality, Correct Procedure, Correct Position, site marked, Risks and benefits discussed, pre-op evaluation, post-op pain management  Laterality: Left  Prep: chloraprep       Needles:  Injection technique: Single-shot  Needle Type: Stimiplex     Needle Length: 10cm 10 cm Needle Gauge: 21 and 21 G    Additional Needles:  Procedures: ultrasound guided (picture in chart) and nerve stimulator  Motor weakness within 5 minutes. Popliteal block  Nerve Stimulator or Paresthesia:  Response: Plantar flexion/toe flexion, 0.8 mA,   Additional Responses:   Narrative:  Injection made incrementally with aspirations  every 5 mL.  Performed by: Personally  Anesthesiologist: Lewie LoronGERMEROTH, JOHN  Additional Notes: Nerve located and needle positioned with direct ultrasound guidance. Good perineural spread. Patient tolerated well.   Procedure Name: LMA Insertion Date/Time: 05/27/2014 8:44 AM Performed by: Darcey NoraJAMES, Yavuz Kirby B Pre-anesthesia Checklist: Patient identified, Emergency Drugs available, Suction available and Patient being monitored Patient Re-evaluated:Patient Re-evaluated prior to inductionOxygen Delivery Method: Circle system utilized Preoxygenation: Pre-oxygenation with 100% oxygen Intubation Type: IV induction Ventilation: Mask ventilation without difficulty LMA: LMA inserted LMA Size: 4.0 Tube type: Oral Number of attempts: 1 Placement Confirmation: positive ETCO2 and breath sounds checked- equal and bilateral Tube secured with: Tape (taped across cheeks) Dental Injury: Teeth and Oropharynx as per pre-operative assessment

## 2014-05-27 NOTE — Progress Notes (Signed)
[facility]                                                                              Patient Demographics  [name], is a 81 y.o. female, DOB - 06-08-1919, VQQ:[id number]  Admit date - 05/23/2014   Admitting Physician Ron Parker, MD  Outpatient Primary MD for the patient is [name], MD  LOS - 3   Chief Complaint  Patient presents with  . Fall    open fracture to left lower leg       Brief HPI   81 y.o. female with a past medical history significant for complete heart block s/p pacemaker placement, atrial fibrillation, neuropathy, from ILF who presented to [facility] ED status post fall from chair. Apparently, pt fell backwards while sitting on the chair and was unable to get up. X ray studies on admission revealed left ankle dislocation and fracture.  Patient underwent external fixation left ankle fracture and debridement of bone associated with open fracture dislocation, left ankle 05/24/14. Plan to return to or tomorrow 5/11 for internal fixation of the left ankle.    Assessment & Plan    Principal Problem: Fall / left ankle fracture - Pt is s/p external fixation left ankle fracture and debridement of bone associated with open fracture dislocation, left ankle done 05/24/14. - Status post open internal fixation of the left trimalleolar ankle fracture, debridement of the bone, removal of external fixator  Today - PT eval -> SNF - Pain controlled    Active Problems:  Atrial fibrillation / Pacemaker  - CHADS vasc score 4, not on anticoagulation because of risk of falls. Currently she is on subcutaneous heparin for DVT prophylaxis. - Heart rate controlled.  Leukocytosis- resolved - Likely reactive secondary to fall. - Has received preop cefazolin x once , UA negative for UTI   Essential hypertension - BP stable  DVT Prophylaxis heparin subcutaneous  Code Status: Full code  Family Communication: Discussed in detail with the patient, all  imaging results, lab results explained to the patient  Disposition Plan: Likely to skilled nursing facility when stable  Time Spent in minutes   25 minutes  Procedures  External fixation of the left ankle fracture on 5/8 Debridement of the bone associated with open fracture dislocation, left ankle and complex wound repair of left medial ankle wound on 5/8  Consults   Orthopedics, Dr Roda Shutters  DVT Prophylaxis  heparin subcutaneous  Medications  Scheduled Meds: . aspirin EC  325 mg Oral BID  . sodium chloride  3 mL Intravenous Q12H   Continuous Infusions: . sodium chloride Stopped (05/27/14 0900)   PRN Meds:.acetaminophen **OR** acetaminophen, alum & mag hydroxide-simeth, fentaNYL (SUBLIMAZE) injection, HYDROmorphone (DILAUDID) injection, meperidine (DEMEROL) injection, ondansetron **OR** ondansetron (ZOFRAN) IV, oxyCODONE, promethazine   Antibiotics   Anti-infectives    Start     Dose/Rate Route Frequency Ordered Stop   05/23/14 2345  ceFAZolin (ANCEF) IVPB 1 g/50 mL premix     1 g 100 mL/hr over 30 Minutes Intravenous  Once 05/23/14 2340 05/24/14 0019        Subjective:   [name] was seen  and examined today. Patient seen prior to the surgery today, pain controlled, no specific complaints.NPO.  Denies any specific complaints, pain controlled, denies dizziness, chest pain, shortness of breath, abdominal pain, N/V/D/C, new weakness, numbess, tingling. No acute events overnight.    Objective:   Blood pressure 145/84, pulse 73, temperature 97.9 F (36.6 C), temperature source Oral, resp. rate 14, height  (1.702 m), weight 99.474 kg (219 lb 4.8 oz), SpO2 96 %.  Wt Readings from Last 3 Encounters:  05/24/14 99.474 kg (219 lb 4.8 oz)  09/11/11 84.369 kg (186 lb)  08/02/11 87.726 kg (193 lb 6.4 oz)     Intake/Output Summary (Last 24 hours) at 05/27/14 1202 Last data filed at 05/27/14 1050  Gross per 24 hour  Intake   2280 ml  Output   1525 ml  Net    755 ml     Exam  General: Alert and oriented x 3, NAD  HEENT:  PERRLA, EOMI  Neck: Supple, no JVD, no masses  CVS: S1 S2 clear, RRR  Respiratory: CTAB  Abdomen: Soft, nontender, nondistended, + bowel sounds  Ext: no cyanosis clubbing, left lower leg in external fixator  Neuro: AAOx3, Cr N's II- XII. Strength 5/5 upper extremities bilaterally  Skin: No rashes  Psych: Normal affect and demeanor, alert and oriented x3    Data Review   Micro Results Recent Results (from the past 240 hour(s))  Urine culture     Status: None   Collection Time: 05/25/14  1:30 PM  Result Value Ref Range Status   Specimen Description URINE, RANDOM  Final   Special Requests NONE  Final   Colony Count NO GROWTH Performed at [facility]   Final   Culture NO GROWTH Performed at [facility]   Final   Report Status 05/26/2014 FINAL  Final    Radiology Reports Dg Shoulder Right  05/25/2014   CLINICAL DATA:  Acute right shoulder pain after recent fall. Initial encounter.  EXAM: RIGHT SHOULDER - 2+ VIEW  COMPARISON:  None.  FINDINGS: No fracture or dislocation is noted. Visualized ribs appear normal. Severe narrowing of subacromial space is noted consistent with rotator cuff injury. Degenerative change of right glenohumeral and acromioclavicular joints is noted as well.  IMPRESSION: Degenerative changes of right glenohumeral and acromioclavicular joints is noted as well as severe subacromial joint space narrowing suggesting rotator cuff injury. No acute abnormality seen in the right shoulder.   Electronically Signed   By: Lupita Raider, M.D.   On: 05/25/2014 09:32   Dg Ankle 2 Views Left  05/24/2014   CLINICAL DATA:  Left ankle fracture/dislocation.  EXAM: DG C-ARM 61-120 MIN; LEFT ANKLE - 2 VIEW  : COMPARISON:  Left ankle radiographs obtained earlier today.  FINDINGS: 8 C arm views of the left ankle demonstrate placement of hardware for external fixation of the previously described a ankle  fracture/dislocation.  IMPRESSION: Operative placement of hardware for external fixation.   Electronically Signed   By: Beckie Salts M.D.   On: 05/24/2014 07:12   Dg Ankle Complete Left  05/27/2014   CLINICAL DATA:  ORIF left ankle fracture  EXAM: DG C-ARM 61-120 MIN; LEFT ANKLE COMPLETE - 3+ VIEW  FLUOROSCOPY TIME:  Fluoroscopy Time (in minutes and seconds): 51 seconds  Number of Acquired Images:  3  COMPARISON:  CT left lower extremity dated 05/24/2014  FINDINGS: Frontal and lateral fluoroscopic spot images during ORIF of bimalleolar fractures.  Lateral compression plate and screw  fixation of the distal fibula.  Two cannulated cancellous screws through the medial malleolus.  A single distal syndesmotic screw.  IMPRESSION: Fluoroscopic spot images during ORIF of the distal tibia and fibula, as above.   Electronically Signed   By: Charline BillsSriyesh  Krishnan M.D.   On: 05/27/2014 10:41   Ct Head Wo Contrast  05/24/2014   CLINICAL DATA:  Initial evaluation for acute trauma, fall.  EXAM: CT HEAD WITHOUT CONTRAST  CT CERVICAL SPINE WITHOUT CONTRAST  TECHNIQUE: Multidetector CT imaging of the head and cervical spine was performed following the standard protocol without intravenous contrast. Multiplanar CT image reconstructions of the cervical spine were also generated.  COMPARISON:  None.  FINDINGS: CT HEAD FINDINGS  Diffuse prominence of the CSF containing spaces is compatible with generalized cerebral atrophy. Patchy hypodensity within the periventricular and deep white matter both cerebral hemispheres most consistent with chronic small vessel ischemic disease. Remote left frontal infarct noted.  No acute large vessel territory infarct. No intracranial hemorrhage. No extra-axial fluid collection.  No mass lesion or midline shift.  No hydrocephalus.  Scalp soft tissues within normal limits. No acute abnormality about the orbits.  Calvarium intact.  Paranasal sinuses are clear.  No mastoid effusion.  CT CERVICAL SPINE  FINDINGS  Vertebral bodies are normally aligned with preservation of the normal cervical lordosis. Vertebral body heights are maintained. No acute fracture or listhesis.  Advanced degenerative osteoarthrosis present about the C1-2 articulation as well as at the atlanto occipital articulation. Prominent bridging anterior osteophytes extend from C4 through T2. Posterior osteophytic spurring present at C3-4.  No acute soft tissue abnormality within the neck. Vascular calcifications present about the carotid bifurcations. Visualized lungs are clear.  IMPRESSION: CT BRAIN:  1. No acute intracranial process. 2. Remote left frontal lobe infarct. 3. Generalized cerebral atrophy with chronic microvascular ischemic disease.  CT CERVICAL SPINE:  1. No acute traumatic injury within the cervical spine. 2. Advanced multilevel degenerative disc disease as above.   Electronically Signed   By: Rise MuBenjamin  McClintock M.D.   On: 05/24/2014 01:47   Ct Cervical Spine Wo Contrast  05/24/2014   CLINICAL DATA:  Initial evaluation for acute trauma, fall.  EXAM: CT HEAD WITHOUT CONTRAST  CT CERVICAL SPINE WITHOUT CONTRAST  TECHNIQUE: Multidetector CT imaging of the head and cervical spine was performed following the standard protocol without intravenous contrast. Multiplanar CT image reconstructions of the cervical spine were also generated.  COMPARISON:  None.  FINDINGS: CT HEAD FINDINGS  Diffuse prominence of the CSF containing spaces is compatible with generalized cerebral atrophy. Patchy hypodensity within the periventricular and deep white matter both cerebral hemispheres most consistent with chronic small vessel ischemic disease. Remote left frontal infarct noted.  No acute large vessel territory infarct. No intracranial hemorrhage. No extra-axial fluid collection.  No mass lesion or midline shift.  No hydrocephalus.  Scalp soft tissues within normal limits. No acute abnormality about the orbits.  Calvarium intact.  Paranasal sinuses  are clear.  No mastoid effusion.  CT CERVICAL SPINE FINDINGS  Vertebral bodies are normally aligned with preservation of the normal cervical lordosis. Vertebral body heights are maintained. No acute fracture or listhesis.  Advanced degenerative osteoarthrosis present about the C1-2 articulation as well as at the atlanto occipital articulation. Prominent bridging anterior osteophytes extend from C4 through T2. Posterior osteophytic spurring present at C3-4.  No acute soft tissue abnormality within the neck. Vascular calcifications present about the carotid bifurcations. Visualized lungs are clear.  IMPRESSION: CT BRAIN:  1. No acute intracranial process. 2. Remote left frontal lobe infarct. 3. Generalized cerebral atrophy with chronic microvascular ischemic disease.  CT CERVICAL SPINE:  1. No acute traumatic injury within the cervical spine. 2. Advanced multilevel degenerative disc disease as above.   Electronically Signed   By: Rise Mu M.D.   On: 05/24/2014 01:47   Ct Ankle Left Wo Contrast  05/24/2014   CLINICAL DATA:  S/P External fixation left ankle fractureDebridement of bone associated with open fracture dislocation, left ankleComplex wound repair of left medial ankleHx of bilateral knee and RT leg surgeries Hx of arthritis per pt  EXAM: CT OF THE LEFT ANKLE WITHOUT CONTRAST  TECHNIQUE: Multidetector CT imaging of the left ankle was performed according to the standard protocol. Multiplanar CT image reconstructions were also generated.  COMPARISON:  None.  FINDINGS: There are fractures of the distal tibia and fibula. There is a transverse fracture across the base the medial malleolus. An oblique comminuted fracture crosses the distal fibula from the posterior lateral meta diaphysis to the anterior metaphysis. The fibular fracture is mildly displaced laterally, with the major fracture components separated by 6 mm. No significant displacement of the medial malleolar fracture component. There is an  additional fracture along the posterior lateral margin of the distal tibia with a sliver of bone displacing laterally by 4 mm.  The ankle mortise is normally spaced and aligned.  The fractures are held in position with an external stabilizer with screws extending through the mid tibial shaft and to the body of the calcaneus.  Bones are diffusely demineralized. There is no convincing bone resorption to suggest osteomyelitis.  Soft tissue air is seen along the anterior and anterior lateral aspect of the ankle joint, along several of the extensor tendons sheaths, and along the medial aspect of the ankle. Air tracks superiorly along the lateral and medial aspects of the ankle and calf both along the ankle musculature and along the superficial muscle fascia. There is mild edema in the subcutaneous soft tissues of the ankle, but no formed fluid collection to suggest an abscess. There is also no ankle joint effusion to suggest a septic arthritis.  IMPRESSION: 1. External fixation of fractures of the left ankle. There is an oblique comminuted fracture of the distal fibula, mildly displaced, with the major distal fracture component displacing laterally by 6 mm. There is a transverse fracture across the base of the medial malleolus, nondisplaced and a small sliver type fracture of the posterior lateral tibia, minimally displaced. 2. Ankle mortise is normally spaced and aligned. 3. There is soft tissue air in edema presumably from the recent wound debridement. No evidence of an abscess. 4. No ankle joint effusion. 5. No CT evidence of osteomyelitis.   Electronically Signed   By: Amie Portland M.D.   On: 05/24/2014 10:45   Dg Chest Port 1 View  05/24/2014   CLINICAL DATA:  Fall.  Left ankle deformity.  EXAM: PORTABLE CHEST - 1 VIEW  COMPARISON:  08/02/2011  FINDINGS: Pacemaker remains in place. Cardiac silhouette is upper limits of normal in size. The lungs are mildly hypoinflated without evidence of airspace consolidation,  edema, pleural effusion, or pneumothorax. Right upper quadrant abdominal surgical clips are noted. No acute osseous abnormality is identified. Evidence chronic left rotator cuff tear.  IMPRESSION: No active disease.   Electronically Signed   By: Sebastian Ache   On: 05/24/2014 01:12   Dg Ankle Left Port  05/24/2014   CLINICAL DATA:  Left ankle  pain and deformity after fall  EXAM: PORTABLE LEFT ANKLE - 2 VIEW  COMPARISON:  None.  FINDINGS: Two portable views of the left ankle demonstrate a fracture dislocation. The dislocation is probably lateral and posterior, but the exam is quite limited. Comminuted fragments are present about the distal fibula and tibia.  IMPRESSION: Very limited study, demonstrating a fracture dislocation about the left ankle.   Electronically Signed   By: Ellery Plunk M.D.   On: 05/24/2014 01:12   Dg Humerus Right  05/25/2014   CLINICAL DATA:  Fall.  Pain.  EXAM: RIGHT HUMERUS - 2+ VIEW  COMPARISON:  08/02/2011.  FINDINGS: Prominent degenerative changes right shoulder. Severe acromioclavicular glenohumeral degenerative change. Right shoulder is high-riding consistent with rotator cuff tear. No acute bony abnormality identified.  IMPRESSION: Severe acromioclavicular glenohumeral degenerative change. High-riding right shoulder consistent with rotator cuff tear. No acute abnormality. Similar findings noted on prior exam.   Electronically Signed   By: Maisie Fus  Register   On: 05/25/2014 09:31   Dg C-arm 1-60 Min  05/27/2014   CLINICAL DATA:  ORIF left ankle fracture  EXAM: DG C-ARM 61-120 MIN; LEFT ANKLE COMPLETE - 3+ VIEW  FLUOROSCOPY TIME:  Fluoroscopy Time (in minutes and seconds): 51 seconds  Number of Acquired Images:  3  COMPARISON:  CT left lower extremity dated 05/24/2014  FINDINGS: Frontal and lateral fluoroscopic spot images during ORIF of bimalleolar fractures.  Lateral compression plate and screw fixation of the distal fibula.  Two cannulated cancellous screws through the medial  malleolus.  A single distal syndesmotic screw.  IMPRESSION: Fluoroscopic spot images during ORIF of the distal tibia and fibula, as above.   Electronically Signed   By: Charline Bills M.D.   On: 05/27/2014 10:41   Dg C-arm 1-60 Min  05/24/2014   CLINICAL DATA:  Left ankle fracture/dislocation.  EXAM: DG C-ARM 61-120 MIN; LEFT ANKLE - 2 VIEW  : COMPARISON:  Left ankle radiographs obtained earlier today.  FINDINGS: 8 C arm views of the left ankle demonstrate placement of hardware for external fixation of the previously described a ankle fracture/dislocation.  IMPRESSION: Operative placement of hardware for external fixation.   Electronically Signed   By: Beckie Salts M.D.   On: 05/24/2014 07:12    CBC  Recent Labs Lab 05/24/14 0025 05/24/14 0750 05/26/14 0640 05/27/14 0517  WBC 13.0* 13.4* 9.0 6.4  HGB 13.0 13.0 10.1* 10.1*  HCT 38.5 38.4 29.9* 31.4*  PLT 224 210 187 207  MCV 93.9 94.8 97.4 96.3  MCH 31.7 32.1 32.9 31.0  MCHC 33.8 33.9 33.8 32.2  RDW 13.7 13.8 14.0 13.8  LYMPHSABS 0.6*  --   --   --   MONOABS 0.9  --   --   --   EOSABS 0.0  --   --   --   BASOSABS 0.0  --   --   --     Chemistries   Recent Labs Lab 05/24/14 0025 05/24/14 0750 05/26/14 0640 05/27/14 0517  NA 139 140 138 138  K 4.3 4.5 3.7 3.5  CL 104 104 105 105  CO2 GLUCOSE 158* 132* 101* 98  BUN 22* 22* 19 13  CREATININE 0.78 0.79 0.65 0.56  CALCIUM 9.0 9.1 8.0* 8.0*   ------------------------------------------------------------------------------------------------------------------ estimated creatinine clearance is 54.4 mL/min (by C-G formula based on Cr of 0.56). ------------------------------------------------------------------------------------------------------------------ No results for input(s): HGBA1C in the last 72 hours. ------------------------------------------------------------------------------------------------------------------ No results for input(s): CHOL, HDL, LDLCALC,  TRIG, CHOLHDL, LDLDIRECT in the last 72 hours. ------------------------------------------------------------------------------------------------------------------ No results for input(s): TSH, T4TOTAL, T3FREE, THYROIDAB in the last 72 hours.  Invalid input(s): FREET3 ------------------------------------------------------------------------------------------------------------------ No results for input(s): VITAMINB12, FOLATE, FERRITIN, TIBC, IRON, RETICCTPCT in the last 72 hours.  Coagulation profile No results for input(s): INR, PROTIME in the last 168 hours.  No results for input(s): DDIMER in the last 72 hours.  Cardiac Enzymes No results for input(s): CKMB, TROPONINI, MYOGLOBIN in the last 168 hours.  Invalid input(s): CK ------------------------------------------------------------------------------------------------------------------ Invalid input(s): POCBNP  No results for input(s): GLUCAP in the last 72 hours.   Jomaira Darr M.D. [facility] 05/27/2014, 12:02 PM  Pager: [phone]   Between 7am to 7pm - call Pager - [phone]  After 7pm go to [email or web address] - password [email or web address]  Call night coverage person covering after 7pm

## 2014-05-27 NOTE — Op Note (Addendum)
Date of Surgery: 05/27/2014  INDICATIONS: [name] is a 81 y.o.-year-old female who sustained a left ankle open fracture dislocation who was provisionally fixed with external fixation and returns today for definitive fixation of injury;  The patient did consent to the procedure after discussion of the risks and benefits.  PREOPERATIVE DIAGNOSIS: Left open ankle fracture dislocation  POSTOPERATIVE DIAGNOSIS: Same.  PROCEDURE:  1. Open treatment with internal fixation of left trimalleolar ankle fracture without fixation of the posterior malleolus 2. Open treatment of syndesmosis with internal fixation 3. Debridement of bone, skin associated with open fracture 4. Debridement of bone, subcutaneous tissue, skin of ex fix pin sites 5. Removal of external fixator under anesthesia 6. Complex repair of left medial ankle traumatic wound  SURGEON: [name], M.D.  ASSIST: [name], RNFA.  ANESTHESIA:  general, regional  IV FLUIDS AND URINE: See anesthesia.  ESTIMATED BLOOD LOSS: minimal mL.  IMPLANTS:  1. Stryker Variax 6 hole fibula plate 2. Stryker 4.0 mm cannulated screws x 2 3. DBX putty 1 cc  DRAINS: none  COMPLICATIONS: None.  DESCRIPTION OF PROCEDURE: The patient was brought to the operating room and placed supine on the operating table.  The patient had been signed prior to the procedure and this was documented. The patient had the anesthesia placed by the anesthesiologist.  A time-out was performed to confirm that this was the correct patient, site, side and location. The patient had an SCD on the opposite lower extremity. The patient did receive antibiotics prior to the incision and was re-dosed during the procedure as needed at indicated intervals.  The patient had the operative extremity prepped and draped in the standard surgical fashion.  the extremity was elevated for exsanguination and the tourniquet was inflated to 350 mmHg. We first turned our attention to  fixation of the lateral malleolus. A lateral incision over the lateral aspect of the distal fibula was used. Full-thickness flaps were raised off of the fibula. The fracture was exposed. The fracture was extremely short. We used a external fixator as a way to gain length across the fractured fibula. The external fixator was locked in place to hold the reduction. Fluoroscopy was used to confirm adequate reduction and restoration of the fibular length. We then placed a 6-hole fibular plate on the lateral aspect of the fibula. We placed 4 nonlocking screws in the proximal shaft and we placed 4 locking screws distally in the fibula. After this was done we placed a syndesmotic screw because parallel to the ankle joint as possible through the plate. After this was done we then turned our attention to the medial malleolus. Under fluoroscopy the medial malleolus was nondisplaced and given the proximity of the traumatic wound on the medial side we decided it was best to placed the cannulated screws percutaneously to minimize soft tissue insult. The screws were placed using fluoroscopy and K wires. After this was done we then placed 1 mL of DBX putty in the bony void of the fibula. We then opened back up the traumatic wound and performed sharp excisional debridement of the bone, skin with rongeur and knife. The skin edges were excised back to a healthy skin edge.  This was thoroughly irrigated with normal saline. The tourniquet was deflated and the tissue exhibited good bleeding. We then performed a complex repair of the left medial ankle traumatic wound with interrupted 3-0 nylon sutures. We then performed sharp excisional debridement of the bone and subcutaneous tissue from the 4 pin  sites. These wounds were also thoroughly irrigated. These wounds were left open. The lateral ankle incision was closed in layer fashion using 0 Vicryl, 2-0 Vicryl and 3-0 nylon. Final x-rays were taken. Sterile dressings were applied. The leg  was immobilized in a short-leg splint. The patient tolerated the procedure well was extubated and transferred to the PACU in stable condition. All sponge counts were correct.   POSTOPERATIVE PLAN: the patient will be strictly nonweightbearing to the left lower extremity. She will be placed on aspirin for DVT prophylaxis. She will be up with physical therapy when able.  We will have her come back to the office in 1 week for recheck of the wounds.  Glee ArvinMichael Xu, MD [facility] [phone] 10:40 AM

## 2014-05-27 NOTE — Discharge Instructions (Signed)
° ° °  1. Keep splint clean and dry °2. Elevate foot above level of the heart °3. Take aspirin to prevent blood clots °4. Take pain meds as needed °5. Strict non weight bearing to operative extremity ° °

## 2014-05-27 NOTE — H&P (Signed)
H&P update  The surgical history has been reviewed and remains accurate without interval change.  The patient was re-examined and patient's physiologic condition has not changed significantly in the last 30 days. The condition still exists that makes this procedure necessary. The treatment plan remains the same, without new options for care.  No new pharmacological allergies or types of therapy has been initiated that would change the plan or the appropriateness of the plan.  The patient and/or family understand the potential benefits and risks.  Mayra ReelN. Michael Elise Knobloch, MD 05/27/2014 7:17 AM

## 2014-05-28 ENCOUNTER — Encounter (HOSPITAL_COMMUNITY): Payer: Self-pay | Admitting: Orthopaedic Surgery

## 2014-05-28 LAB — CBC
HCT: 31.4 % — ABNORMAL LOW (ref 36.0–46.0)
Hemoglobin: 10.4 g/dL — ABNORMAL LOW (ref 12.0–15.0)
MCH: 31.4 pg (ref 26.0–34.0)
MCHC: 33.1 g/dL (ref 30.0–36.0)
MCV: 94.9 fL (ref 78.0–100.0)
Platelets: 220 10*3/uL (ref 150–400)
RBC: 3.31 MIL/uL — AB (ref 3.87–5.11)
RDW: 13.8 % (ref 11.5–15.5)
WBC: 9.1 10*3/uL (ref 4.0–10.5)

## 2014-05-28 LAB — BASIC METABOLIC PANEL
Anion gap: 9 (ref 5–15)
BUN: 10 mg/dL (ref 6–20)
CO2: 24 mmol/L (ref 22–32)
CREATININE: 0.75 mg/dL (ref 0.44–1.00)
Calcium: 8 mg/dL — ABNORMAL LOW (ref 8.9–10.3)
Chloride: 104 mmol/L (ref 101–111)
GFR calc Af Amer: >60 mL/min (ref >60)
GLUCOSE: 167 mg/dL — AB (ref 65–99)
Potassium: 3.5 mmol/L (ref 3.5–5.1)
Sodium: 137 mmol/L (ref 135–145)

## 2014-05-28 NOTE — Progress Notes (Signed)
Physical Therapy Treatment Patient Details Name: [name] RoughJoan Stevens MRN: [id number] DOB: 11/01/1919 Today's Date: 05/28/2014    History of Present Illness Patient is a 81 y/o female admitted s/p fall from a chair. Pt s/p ORIF left ankle and removal of external fixator. PMH of complete heart block s/p pacemaker placement, A-fib, neuropathy and dementia.    PT Comments    Patient now s/p ORIF Left ankle and removal of external fixator. Continues to be limited by pain and fatigue. Pt declined transferring to chair today due to above. Tolerated sitting EOB ~12 minutes and performing there ex. Goals updated. Continue to recommend ST SNF to improve transfers and overall mobility so pt can ease burden of care and maximize independence.    Follow Up Recommendations  SNF;Supervision/Assistance - 24 hour     Equipment Recommendations  Other (comment)    Recommendations for Other Services       Precautions / Restrictions Precautions Precautions: Fall Restrictions Weight Bearing Restrictions: Yes LLE Weight Bearing: Non weight bearing    Mobility  Bed Mobility Overal bed mobility: Needs Assistance Bed Mobility: Supine to Sit     Supine to sit: Max assist;+2 for physical assistance;HOB elevated Sit to supine: +2 for physical assistance;HOB elevated;Total assist   General bed mobility comments: Pt able to minimally assist mobilizing BLEs to EOB. Used pad to scoot bottom to EOB.   Transfers Overall transfer level:  (Pt declined sitting in chair.)                  Ambulation/Gait                 Stairs            Wheelchair Mobility    Modified Rankin (Stroke Patients Only)       Balance Overall balance assessment: Needs assistance Sitting-balance support: Feet supported;Single extremity supported Sitting balance-Leahy Scale: Fair Sitting balance - Comments: Tolerated static/dynamic sitting EOB with Min guard-Min A for support. Able to perform ADLs and there  ex LEs without LOB. Fatigues easily. Sat EOB ~12 minutes. Postural control: Posterior lean                          Cognition Arousal/Alertness: Awake/alert Behavior During Therapy: WFL for tasks assessed/performed Overall Cognitive Status: History of cognitive impairments - at baseline                      Exercises General Exercises - Lower Extremity Quad Sets: Both;5 reps;Supine Long Arc Quad: Both;10 reps;Seated    General Comments        Pertinent Vitals/Pain Pain Assessment: Faces Faces Pain Scale: Hurts even more Pain Location: back and LLE with movement. Pain Descriptors / Indicators: Sore;Aching Pain Intervention(s): Limited activity within patient's tolerance;Monitored during session;Repositioned    Home Living                      Prior Function            PT Goals (current goals can now be found in the care plan section) Progress towards PT goals: Progressing toward goals    Frequency  Min 3X/week    PT Plan Current plan remains appropriate    Co-evaluation             End of Session   Activity Tolerance: Patient tolerated treatment well;Patient limited by pain;Patient limited by fatigue Patient left: in bed;with call bell/phone within  reach;with bed alarm set     Time: [id number] PT Time Calculation (min) (ACUTE ONLY): 24 min  Charges:  $Therapeutic Activity: 8-22 mins                    G Codes:      Kierston Plasencia A Izola Teague 05/28/2014, 1:05 PM  Mylo RedShauna Mickie Kozikowski, PT, DPT [phone]

## 2014-05-28 NOTE — Progress Notes (Signed)
[facility]                                                                              Patient Demographics  [name], is a 81 y.o. female, DOB - 04/21/1919, ZOX:[id number]  Admit date - 05/23/2014   Admitting Physician Ron Parker, MD  Outpatient Primary MD for the patient is [name], MD  LOS - 4   Chief Complaint  Patient presents with  . Fall    open fracture to left lower leg       Brief HPI   81 y.o. female with a past medical history significant for complete heart block s/p pacemaker placement, atrial fibrillation, neuropathy, from ILF who presented to [facility] ED status post fall from chair. Apparently, pt fell backwards while sitting on the chair and was unable to get up. X ray studies on admission revealed left ankle dislocation and fracture.  Patient underwent external fixation left ankle fracture and debridement of bone associated with open fracture dislocation, left ankle 05/24/14. Plan to return to or tomorrow 5/11 for internal fixation of the left ankle.    Assessment & Plan    Principal Problem: Fall / left ankle fracture: Postop day #1 - Pt is s/p external fixation left ankle fracture and debridement of bone associated with open fracture dislocation, left ankle done 05/24/14. - Status post open internal fixation of the left trimalleolar ankle fracture, debridement of the bone, removal of external fixator  - Up with physical therapy today - Pain controlled    Active Problems:  Atrial fibrillation / Pacemaker  - CHADS vasc score 4, not on anticoagulation because of risk of falls. Currently she is on subcutaneous heparin for DVT prophylaxis. - Heart rate controlled.  Leukocytosis- resolved - Likely reactive secondary to fall. - Has received preop cefazolin x once , UA negative for UTI   Essential hypertension - BP stable  DVT Prophylaxis heparin subcutaneous  Code Status: Full code  Family Communication: Discussed in  detail with the patient, all imaging results, lab results explained to the patient  Disposition Plan: Likely to skilled nursing facility when stable  Time Spent in minutes   25 minutes  Procedures  External fixation of the left ankle fracture on 5/8 Debridement of the bone associated with open fracture dislocation, left ankle and complex wound repair of left medial ankle wound on 5/8  Consults   Orthopedics, Dr Roda Shutters  DVT Prophylaxis  heparin subcutaneous  Medications  Scheduled Meds: . aspirin EC  325 mg Oral BID  . sodium chloride  3 mL Intravenous Q12H   Continuous Infusions: . sodium chloride 50 mL/hr at 05/28/14 0940   PRN Meds:.acetaminophen **OR** acetaminophen, alum & mag hydroxide-simeth, fentaNYL (SUBLIMAZE) injection, HYDROmorphone (DILAUDID) injection, meperidine (DEMEROL) injection, ondansetron **OR** ondansetron (ZOFRAN) IV, oxyCODONE, promethazine   Antibiotics   Anti-infectives    Start     Dose/Rate Route Frequency Ordered Stop   05/23/14 2345  ceFAZolin (ANCEF) IVPB 1 g/50 mL premix     1 g 100 mL/hr over 30 Minutes Intravenous  Once 05/23/14 2340 05/24/14 0019        Subjective:  [name] was seen and examined today. Pain control, denies any abdominal pain, nausea or vomiting. Afebrile. States it hurts when she moves otherwise stable. Patient denies any  chest pain, shortness of breath, abdominal pain, new weakness, numbess, tingling. No acute events overnight.    Objective:   Blood pressure 133/47, pulse 72, temperature 98.1 F (36.7 C), temperature source Oral, resp. rate 16, height 5\' 7"  (1.702 m), weight 99.474 kg (219 lb 4.8 oz), SpO2 99 %.  Wt Readings from Last 3 Encounters:  05/24/14 99.474 kg (219 lb 4.8 oz)  09/11/11 84.369 kg (186 lb)  08/02/11 87.726 kg (193 lb 6.4 oz)     Intake/Output Summary (Last 24 hours) at 05/28/14 1321 Last data filed at 05/28/14 1145  Gross per 24 hour  Intake 859.17 ml  Output    300 ml  Net  559.17 ml    Exam  General: Alert and oriented x 3, NAD  HEENT:  PERRLA, EOMI  Neck: Supple, no JVD, no masses  CVS: S1 S2 clear, RRR  Respiratory: CTAB  Abdomen: Soft, nontender, nondistended, + bowel sounds  Ext: no cyanosis clubbing, left lower leg in cast  Neuro: exam limited  Skin: No rashes  Psych: Normal affect and demeanor, alert and oriented x3    Data Review   Micro Results Recent Results (from the past 240 hour(s))  Urine culture     Status: None   Collection Time: 05/25/14  1:30 PM  Result Value Ref Range Status   Specimen Description URINE, RANDOM  Final   Special Requests NONE  Final   Colony Count NO GROWTH Performed at [facility]   Final   Culture NO GROWTH Performed at [facility]   Final   Report Status 05/26/2014 FINAL  Final    Radiology Reports Dg Shoulder Right  05/25/2014   CLINICAL DATA:  Acute right shoulder pain after recent fall. Initial encounter.  EXAM: RIGHT SHOULDER - 2+ VIEW  COMPARISON:  None.  FINDINGS: No fracture or dislocation is noted. Visualized ribs appear normal. Severe narrowing of subacromial space is noted consistent with rotator cuff injury. Degenerative change of right glenohumeral and acromioclavicular joints is noted as well.  IMPRESSION: Degenerative changes of right glenohumeral and acromioclavicular joints is noted as well as severe subacromial joint space narrowing suggesting rotator cuff injury. No acute abnormality seen in the right shoulder.   Electronically Signed   By: Lupita RaiderJames  Green Jr, M.D.   On: 05/25/2014 09:32   Dg Ankle 2 Views Left  05/24/2014   CLINICAL DATA:  Left ankle fracture/dislocation.  EXAM: DG C-ARM 61-120 MIN; LEFT ANKLE - 2 VIEW  : COMPARISON:  Left ankle radiographs obtained earlier today.  FINDINGS: 8 C arm views of the left ankle demonstrate placement of hardware for external fixation of the previously described a ankle fracture/dislocation.  IMPRESSION: Operative placement  of hardware for external fixation.   Electronically Signed   By: Beckie SaltsSteven  Reid M.D.   On: 05/24/2014 07:12   Dg Ankle Complete Left  05/27/2014   CLINICAL DATA:  ORIF left ankle fracture  EXAM: DG C-ARM 61-120 MIN; LEFT ANKLE COMPLETE - 3+ VIEW  FLUOROSCOPY TIME:  Fluoroscopy Time (in minutes and seconds): 51 seconds  Number of Acquired Images:  3  COMPARISON:  CT left lower extremity dated 05/24/2014  FINDINGS: Frontal and lateral fluoroscopic spot images during ORIF of bimalleolar fractures.  Lateral compression plate and screw fixation of the distal fibula.  Two cannulated cancellous  screws through the medial malleolus.  A single distal syndesmotic screw.  IMPRESSION: Fluoroscopic spot images during ORIF of the distal tibia and fibula, as above.   Electronically Signed   By: Charline BillsSriyesh  Krishnan M.D.   On: 05/27/2014 10:41   Ct Head Wo Contrast  05/24/2014   CLINICAL DATA:  Initial evaluation for acute trauma, fall.  EXAM: CT HEAD WITHOUT CONTRAST  CT CERVICAL SPINE WITHOUT CONTRAST  TECHNIQUE: Multidetector CT imaging of the head and cervical spine was performed following the standard protocol without intravenous contrast. Multiplanar CT image reconstructions of the cervical spine were also generated.  COMPARISON:  None.  FINDINGS: CT HEAD FINDINGS  Diffuse prominence of the CSF containing spaces is compatible with generalized cerebral atrophy. Patchy hypodensity within the periventricular and deep white matter both cerebral hemispheres most consistent with chronic small vessel ischemic disease. Remote left frontal infarct noted.  No acute large vessel territory infarct. No intracranial hemorrhage. No extra-axial fluid collection.  No mass lesion or midline shift.  No hydrocephalus.  Scalp soft tissues within normal limits. No acute abnormality about the orbits.  Calvarium intact.  Paranasal sinuses are clear.  No mastoid effusion.  CT CERVICAL SPINE FINDINGS  Vertebral bodies are normally aligned with  preservation of the normal cervical lordosis. Vertebral body heights are maintained. No acute fracture or listhesis.  Advanced degenerative osteoarthrosis present about the C1-2 articulation as well as at the atlanto occipital articulation. Prominent bridging anterior osteophytes extend from C4 through T2. Posterior osteophytic spurring present at C3-4.  No acute soft tissue abnormality within the neck. Vascular calcifications present about the carotid bifurcations. Visualized lungs are clear.  IMPRESSION: CT BRAIN:  1. No acute intracranial process. 2. Remote left frontal lobe infarct. 3. Generalized cerebral atrophy with chronic microvascular ischemic disease.  CT CERVICAL SPINE:  1. No acute traumatic injury within the cervical spine. 2. Advanced multilevel degenerative disc disease as above.   Electronically Signed   By: Rise MuBenjamin  McClintock M.D.   On: 05/24/2014 01:47   Ct Cervical Spine Wo Contrast  05/24/2014   CLINICAL DATA:  Initial evaluation for acute trauma, fall.  EXAM: CT HEAD WITHOUT CONTRAST  CT CERVICAL SPINE WITHOUT CONTRAST  TECHNIQUE: Multidetector CT imaging of the head and cervical spine was performed following the standard protocol without intravenous contrast. Multiplanar CT image reconstructions of the cervical spine were also generated.  COMPARISON:  None.  FINDINGS: CT HEAD FINDINGS  Diffuse prominence of the CSF containing spaces is compatible with generalized cerebral atrophy. Patchy hypodensity within the periventricular and deep white matter both cerebral hemispheres most consistent with chronic small vessel ischemic disease. Remote left frontal infarct noted.  No acute large vessel territory infarct. No intracranial hemorrhage. No extra-axial fluid collection.  No mass lesion or midline shift.  No hydrocephalus.  Scalp soft tissues within normal limits. No acute abnormality about the orbits.  Calvarium intact.  Paranasal sinuses are clear.  No mastoid effusion.  CT CERVICAL SPINE  FINDINGS  Vertebral bodies are normally aligned with preservation of the normal cervical lordosis. Vertebral body heights are maintained. No acute fracture or listhesis.  Advanced degenerative osteoarthrosis present about the C1-2 articulation as well as at the atlanto occipital articulation. Prominent bridging anterior osteophytes extend from C4 through T2. Posterior osteophytic spurring present at C3-4.  No acute soft tissue abnormality within the neck. Vascular calcifications present about the carotid bifurcations. Visualized lungs are clear.  IMPRESSION: CT BRAIN:  1. No acute intracranial process. 2. Remote left frontal  lobe infarct. 3. Generalized cerebral atrophy with chronic microvascular ischemic disease.  CT CERVICAL SPINE:  1. No acute traumatic injury within the cervical spine. 2. Advanced multilevel degenerative disc disease as above.   Electronically Signed   By: Rise Mu M.D.   On: 05/24/2014 01:47   Ct Ankle Left Wo Contrast  05/24/2014   CLINICAL DATA:  S/P External fixation left ankle fractureDebridement of bone associated with open fracture dislocation, left ankleComplex wound repair of left medial ankleHx of bilateral knee and RT leg surgeries Hx of arthritis per pt  EXAM: CT OF THE LEFT ANKLE WITHOUT CONTRAST  TECHNIQUE: Multidetector CT imaging of the left ankle was performed according to the standard protocol. Multiplanar CT image reconstructions were also generated.  COMPARISON:  None.  FINDINGS: There are fractures of the distal tibia and fibula. There is a transverse fracture across the base the medial malleolus. An oblique comminuted fracture crosses the distal fibula from the posterior lateral meta diaphysis to the anterior metaphysis. The fibular fracture is mildly displaced laterally, with the major fracture components separated by 6 mm. No significant displacement of the medial malleolar fracture component. There is an additional fracture along the posterior lateral  margin of the distal tibia with a sliver of bone displacing laterally by 4 mm.  The ankle mortise is normally spaced and aligned.  The fractures are held in position with an external stabilizer with screws extending through the mid tibial shaft and to the body of the calcaneus.  Bones are diffusely demineralized. There is no convincing bone resorption to suggest osteomyelitis.  Soft tissue air is seen along the anterior and anterior lateral aspect of the ankle joint, along several of the extensor tendons sheaths, and along the medial aspect of the ankle. Air tracks superiorly along the lateral and medial aspects of the ankle and calf both along the ankle musculature and along the superficial muscle fascia. There is mild edema in the subcutaneous soft tissues of the ankle, but no formed fluid collection to suggest an abscess. There is also no ankle joint effusion to suggest a septic arthritis.  IMPRESSION: 1. External fixation of fractures of the left ankle. There is an oblique comminuted fracture of the distal fibula, mildly displaced, with the major distal fracture component displacing laterally by 6 mm. There is a transverse fracture across the base of the medial malleolus, nondisplaced and a small sliver type fracture of the posterior lateral tibia, minimally displaced. 2. Ankle mortise is normally spaced and aligned. 3. There is soft tissue air in edema presumably from the recent wound debridement. No evidence of an abscess. 4. No ankle joint effusion. 5. No CT evidence of osteomyelitis.   Electronically Signed   By: Amie Portland M.D.   On: 05/24/2014 10:45   Dg Chest Port 1 View  05/24/2014   CLINICAL DATA:  Fall.  Left ankle deformity.  EXAM: PORTABLE CHEST - 1 VIEW  COMPARISON:  08/02/2011  FINDINGS: Pacemaker remains in place. Cardiac silhouette is upper limits of normal in size. The lungs are mildly hypoinflated without evidence of airspace consolidation, edema, pleural effusion, or pneumothorax. Right  upper quadrant abdominal surgical clips are noted. No acute osseous abnormality is identified. Evidence chronic left rotator cuff tear.  IMPRESSION: No active disease.   Electronically Signed   By: Sebastian Ache   On: 05/24/2014 01:12   Dg Ankle Left Port  05/24/2014   CLINICAL DATA:  Left ankle pain and deformity after fall  EXAM: PORTABLE LEFT  ANKLE - 2 VIEW  COMPARISON:  None.  FINDINGS: Two portable views of the left ankle demonstrate a fracture dislocation. The dislocation is probably lateral and posterior, but the exam is quite limited. Comminuted fragments are present about the distal fibula and tibia.  IMPRESSION: Very limited study, demonstrating a fracture dislocation about the left ankle.   Electronically Signed   By: Ellery Plunk M.D.   On: 05/24/2014 01:12   Dg Humerus Right  05/25/2014   CLINICAL DATA:  Fall.  Pain.  EXAM: RIGHT HUMERUS - 2+ VIEW  COMPARISON:  08/02/2011.  FINDINGS: Prominent degenerative changes right shoulder. Severe acromioclavicular glenohumeral degenerative change. Right shoulder is high-riding consistent with rotator cuff tear. No acute bony abnormality identified.  IMPRESSION: Severe acromioclavicular glenohumeral degenerative change. High-riding right shoulder consistent with rotator cuff tear. No acute abnormality. Similar findings noted on prior exam.   Electronically Signed   By: Maisie Fus  Register   On: 05/25/2014 09:31   Dg C-arm 1-60 Min  05/27/2014   CLINICAL DATA:  ORIF left ankle fracture  EXAM: DG C-ARM 61-120 MIN; LEFT ANKLE COMPLETE - 3+ VIEW  FLUOROSCOPY TIME:  Fluoroscopy Time (in minutes and seconds): 51 seconds  Number of Acquired Images:  3  COMPARISON:  CT left lower extremity dated 05/24/2014  FINDINGS: Frontal and lateral fluoroscopic spot images during ORIF of bimalleolar fractures.  Lateral compression plate and screw fixation of the distal fibula.  Two cannulated cancellous screws through the medial malleolus.  A single distal syndesmotic screw.   IMPRESSION: Fluoroscopic spot images during ORIF of the distal tibia and fibula, as above.   Electronically Signed   By: Charline Bills M.D.   On: 05/27/2014 10:41   Dg C-arm 1-60 Min  05/24/2014   CLINICAL DATA:  Left ankle fracture/dislocation.  EXAM: DG C-ARM 61-120 MIN; LEFT ANKLE - 2 VIEW  : COMPARISON:  Left ankle radiographs obtained earlier today.  FINDINGS: 8 C arm views of the left ankle demonstrate placement of hardware for external fixation of the previously described a ankle fracture/dislocation.  IMPRESSION: Operative placement of hardware for external fixation.   Electronically Signed   By: Beckie Salts M.D.   On: 05/24/2014 07:12    CBC  Recent Labs Lab 05/24/14 0025 05/24/14 0750 05/26/14 0640 05/27/14 0517 05/28/14 0511  WBC 13.0* 13.4* 9.0 6.4 9.1  HGB 13.0 13.0 10.1* 10.1* 10.4*  HCT 38.5 38.4 29.9* 31.4* 31.4*  PLT 224 210 187 207 220  MCV 93.9 94.8 97.4 96.3 94.9  MCH 31.7 32.1 32.9 31.0 31.4  MCHC 33.8 33.9 33.8 32.2 33.1  RDW 13.7 13.8 14.0 13.8 13.8  LYMPHSABS 0.6*  --   --   --   --   MONOABS 0.9  --   --   --   --   EOSABS 0.0  --   --   --   --   BASOSABS 0.0  --   --   --   --     Chemistries   Recent Labs Lab 05/24/14 0025 05/24/14 0750 05/26/14 0640 05/27/14 0517 05/28/14 0511  NA 139 140 138 138 137  K 4.3 4.5 3.7 3.5 3.5  CL 104 104 105 105 104  CO2 22 26 24 25 24   GLUCOSE 158* 132* 101* 98 167*  BUN 22* 22* 19 13 10   CREATININE 0.78 0.79 0.65 0.56 0.75  CALCIUM 9.0 9.1 8.0* 8.0* 8.0*   ------------------------------------------------------------------------------------------------------------------ estimated creatinine clearance is 54.4 mL/min (by C-G formula based  on Cr of 0.75). ------------------------------------------------------------------------------------------------------------------ No results for input(s): HGBA1C in the last 72  hours. ------------------------------------------------------------------------------------------------------------------ No results for input(s): CHOL, HDL, LDLCALC, TRIG, CHOLHDL, LDLDIRECT in the last 72 hours. ------------------------------------------------------------------------------------------------------------------ No results for input(s): TSH, T4TOTAL, T3FREE, THYROIDAB in the last 72 hours.  Invalid input(s): FREET3 ------------------------------------------------------------------------------------------------------------------ No results for input(s): VITAMINB12, FOLATE, FERRITIN, TIBC, IRON, RETICCTPCT in the last 72 hours.  Coagulation profile No results for input(s): INR, PROTIME in the last 168 hours.  No results for input(s): DDIMER in the last 72 hours.  Cardiac Enzymes No results for input(s): CKMB, TROPONINI, MYOGLOBIN in the last 168 hours.  Invalid input(s): CK ------------------------------------------------------------------------------------------------------------------ Invalid input(s): POCBNP  No results for input(s): GLUCAP in the last 72 hours.   RAI,RIPUDEEP M.D. [facility] 05/28/2014, 1:21 PM  Pager: [phone]   Between 7am to 7pm - call Pager - [phone]  After 7pm go to [email or web address] - password [email or web address]  Call night coverage person covering after 7pm

## 2014-05-28 NOTE — Progress Notes (Signed)
   Subjective:  Patient reports pain as mild.    Objective:   VITALS:   Filed Vitals:   05/27/14 1145 05/27/14 1837 05/27/14 2044 05/28/14 0616  BP: 145/84 133/60 126/60 111/60  Pulse: 73 68 72 75  Temp: 97.9 F (36.6 C) 97.1 F (36.2 C) 98.2 F (36.8 C) 98.1 F (36.7 C)  TempSrc: Oral Oral Oral Oral  Resp: 14 15    Height:      Weight:      SpO2: 96% 98% 94% 95%    Neurologically intact Neurovascular intact Sensation intact distally Intact pulses distally No cellulitis present Compartment soft   Lab Results  Component Value Date   WBC 9.1 05/28/2014   HGB 10.4* 05/28/2014   HCT 31.4* 05/28/2014   MCV 94.9 05/28/2014   PLT 220 05/28/2014     Assessment/Plan:  1 Day Post-Op   - Expected postop acute blood loss anemia - will monitor for symptoms - Up with PT/OT - DVT ppx - SCDs, ambulation, aspirin - NWB left lower extremity - stable  [name], Mylin Hirano Michael 05/28/2014, 12:40 PM [phone]

## 2014-05-29 LAB — CBC
HEMATOCRIT: 29 % — AB (ref 36.0–46.0)
Hemoglobin: 9.4 g/dL — ABNORMAL LOW (ref 12.0–15.0)
MCH: 30.9 pg (ref 26.0–34.0)
MCHC: 32.4 g/dL (ref 30.0–36.0)
MCV: 95.4 fL (ref 78.0–100.0)
PLATELETS: 216 10*3/uL (ref 150–400)
RBC: 3.04 MIL/uL — AB (ref 3.87–5.11)
RDW: 14.1 % (ref 11.5–15.5)
WBC: 6.7 10*3/uL (ref 4.0–10.5)

## 2014-05-29 LAB — BASIC METABOLIC PANEL
ANION GAP: 9 (ref 5–15)
BUN: 12 mg/dL (ref 6–20)
CALCIUM: 8 mg/dL — AB (ref 8.9–10.3)
CO2: 26 mmol/L (ref 22–32)
CREATININE: 0.71 mg/dL (ref 0.44–1.00)
Chloride: 102 mmol/L (ref 101–111)
GFR calc Af Amer: >60 mL/min (ref >60)
GFR calc non Af Amer: >60 mL/min (ref >60)
GLUCOSE: 102 mg/dL — AB (ref 65–99)
Potassium: 3.5 mmol/L (ref 3.5–5.1)
Sodium: 137 mmol/L (ref 135–145)

## 2014-05-29 MED ORDER — ENSURE ENLIVE PO LIQD
237.0000 mL | Freq: Two times a day (BID) | ORAL | Status: DC
  Administered 2014-05-30 – 2014-06-01 (×4): 237 mL via ORAL

## 2014-05-29 MED ORDER — HYDROCODONE-ACETAMINOPHEN 7.5-325 MG PO TABS: 1.0000 | ORAL_TABLET | Freq: Four times a day (QID) | ORAL | Status: DC | PRN

## 2014-05-29 MED ORDER — DULOXETINE HCL 60 MG PO CPEP: 60.0000 mg | ORAL_CAPSULE | Freq: Every day | ORAL | Status: DC

## 2014-05-29 MED ORDER — ASPIRIN EC 325 MG PO TBEC: 325.0000 mg | DELAYED_RELEASE_TABLET | Freq: Two times a day (BID) | ORAL | Status: DC

## 2014-05-29 MED ORDER — DIAZEPAM 2 MG PO TABS: 2.0000 mg | ORAL_TABLET | Freq: Every evening | ORAL | Status: DC | PRN

## 2014-05-29 NOTE — Clinical Social Work Note (Signed)
Per chart review, patient discharge cancelled till Monday (Jun 01, 2014). [facility] admissions liaison updated regarding change in discharge date. CSW to continue to follow and assist with patient's discharge on Monday (Jun 01, 2014).  Marcelline DeistEmily Ranulfo Kall, [place] Cell: [phone]       Fax: [phone] Clinical Social Work: Orthopedics [phone]([id number]) and Surgical [phone]([id number])

## 2014-05-29 NOTE — Progress Notes (Signed)
Initial Nutrition Assessment  DOCUMENTATION CODES:  Obesity unspecified  INTERVENTION:  Ensure Enlive (each supplement provides 350kcal and 20 grams of protein) (BID)   Encourage adequate PO intake.  NUTRITION DIAGNOSIS:  Increased nutrient needs related to  (s/p surgery) as evidenced by estimated needs.  GOAL:  Patient will meet greater than or equal to 90% of their needs  MONITOR:  PO intake, Supplement acceptance, Weight trends, Labs, I & O's  REASON FOR ASSESSMENT:  Low Braden    ASSESSMENT: with a history of Pacemaker placement for Complete Heart Block, Atrial Fibrillation and Peripheral Neuropathy with increased L ankle pain after fall. Pt with dislocated Left Ankle Fracture confirmed by X-rays.  PROCEDURE (5/11):  1. Open treatment with internal fixation of left trimalleolar ankle fracture without fixation of the posterior malleolus 2. Open treatment of syndesmosis with internal fixation 3. Debridement of bone, skin associated with open fracture 4. Debridement of bone, subcutaneous tissue, skin of ex fix pin sites 5. Removal of external fixator under anesthesia 6. Complex repair of left medial ankle traumatic wound  Pt reports her appetite is fine currently and PTA with 2 meals a day and no other difficulties. Current meal completion has been 10-65%. Weight has been stable. Pt is agreeable to Ensure to aid in wound healing. RD to order. Pt was encouraged to eat her food at meals and to drink her supplements. Plans for pt to be discharged on Monday.   Labs and medications reviewed.  Height:  Ht Readings from Last 1 Encounters:  05/24/14 5\' 7"  (1.702 m)    Weight:  Wt Readings from Last 1 Encounters:  05/24/14 219 lb 4.8 oz (99.474 kg)    Ideal Body Weight:  61 kg  Wt Readings from Last 10 Encounters:  05/24/14 219 lb 4.8 oz (99.474 kg)  09/11/11 186 lb (84.369 kg)  08/02/11 193 lb 6.4 oz (87.726 kg)  04/19/10 185 lb 1.9 oz (83.97 kg)  12/29/08 185  lb (83.915 kg)    BMI:  Body mass index is 34.34 kg/(m^2). Class I obesity  Estimated Nutritional Needs:  Kcal:  1800-2000  Protein:  100-115 grams  Fluid:  1.8 - 2 L/day  Skin:  Wound (see comment) (Stage II pressure ulcer on R buttocks, incision on L ankle and leg)  Diet Order:  Diet Heart Room service appropriate?: Yes; Fluid consistency:: Thin Diet - low sodium heart healthy  EDUCATION NEEDS:  No education needs identified at this time   Intake/Output Summary (Last 24 hours) at 05/29/14 1524 Last data filed at 05/29/14 1500  Gross per 24 hour  Intake    990 ml  Output    700 ml  Net    290 ml    Last BM:  5/12  Marijean NiemannStephanie La, MS, RD, LDN Pager # [phone] After hours/ weekend pager # [phone]

## 2014-05-29 NOTE — Progress Notes (Signed)
   Subjective:  Patient reports pain as mild.    Objective:   VITALS:   Filed Vitals:   05/28/14 0616 05/28/14 1257 05/29/14 0026 05/29/14 0510  BP: 111/60 133/47 142/59 141/47  Pulse: 75 72 75 63  Temp: 98.1 F (36.7 C) 98.1 F (36.7 C) 98.3 F (36.8 C) 97.5 F (36.4 C)  TempSrc: Oral Oral    Resp:  16 16 16   Height:      Weight:      SpO2: 95% 99% 93% 97%    Splint c/d/i Toes wwp   Lab Results  Component Value Date   WBC 6.7 05/29/2014   HGB 9.4* 05/29/2014   HCT 29.0* 05/29/2014   MCV 95.4 05/29/2014   PLT 216 05/29/2014     Assessment/Plan:  2 Days Post-Op   - stable for dc from ortho standpoint  [name], Rafik Koppel Michael 05/29/2014, 9:04 AM [phone]

## 2014-05-29 NOTE — Treatment Plan (Signed)
Called the patient's daughter, Redmond SchoolLisa Houdeshell who was extremely upset that patient is being discharged today. She mentioned that orthopedics, Dr Roda ShuttersXu had told them yesterday that she will be discharged on Monday. Explained to the patient's daughter that she was stable from orthopedic standpoint today and hence I did the DC and discharge summary. I also discussed in detail with Dr. Roda ShuttersXu, who subsequently called the daughter and discussed with her. Dr Roda ShuttersXu requested to hold the discharge as patient and her daughter want to DC on Monday.   Will continue PT over the weekend.   Marisabel Macpherson M.D. [facility] 05/29/2014, 2:34 PM  Pager: [phone]

## 2014-05-29 NOTE — Clinical Social Work Placement (Signed)
   CLINICAL SOCIAL WORK PLACEMENT  NOTE  Date:  05/29/2014  Patient Details  Name: [name] MRN: [id number] Date of Birth: 01/13/1920  Clinical Social Work is seeking post-discharge placement for this patient at the Skilled  Nursing Facility level of care (*CSW will initial, date and re-position this form in  chart as items are completed):  Yes   Patient/family provided with [facility] Clinical Social Work Department's list of facilities offering this level of care within the geographic area requested by the patient (or if unable, by the patient's family).  Yes   Patient/family informed of their freedom to choose among providers that offer the needed level of care, that participate in Medicare, Medicaid or managed care program needed by the patient, have an available bed and are willing to accept the patient.  Yes   Patient/family informed of [facility]'s ownership interest in [facility] and [facility], as well as of the fact that they are under no obligation to receive care at these facilities.  PASRR submitted to EDS on 05/28/14     PASRR number received on 05/28/14     Existing PASRR number confirmed on       FL2 transmitted to all facilities in geographic area requested by pt/family on 05/28/14     FL2 transmitted to all facilities within larger geographic area on       Patient informed that his/her managed care company has contracts with or will negotiate with certain facilities, including the following:        Yes   Patient/family informed of bed offers received.  [facility]     Physician recommends and patient chooses bed at      [facility] to [facility] on 05/29/14.  Patient to be transferred to facility by [facility]     Patient family notified on 05/29/14 of transfer.  Name of family member notified:  Redmond SchoolLisa Trouten     PHYSICIAN       Additional Comment:     _______________________________________________ Rod MaeVaughn, Shamarcus Hoheisel S, LCSW 05/29/2014, 12:42 PM [phone]

## 2014-05-29 NOTE — Discharge Planning (Signed)
Patient to be discharged [facility]. Patient's daughter, Redmond SchoolLisa Majka, updated.  [facility] Report number: [phone] Transportation: EMS ([facility])  Marcelline DeistEmily Muaz [place], [place] Cell: [phone]       Fax: [phone] Clinical Social Work: Orthopedics [phone]([id number]) and Surgical [phone]([id number])

## 2014-05-29 NOTE — Discharge Summary (Signed)
Physician Discharge Summary   Patient ID: [name] MRN: [id number] DOB/AGE: Dec 02, 1919 81 y.o.  Admit date: 05/23/2014 Discharge date: 05/29/2014  Primary Care Physician:  [name], MD  Discharge Diagnoses:    . Ankle fracture, bimalleolar, closed . Open fracture ankle, bimalleolar . Atrial fibrillation . Pacemaker dual chamber programmed VDD-[employer] . Fall . Cardiac conduction disorder  Consults: Orthopedics, Dr Roda Shutters   Recommendations for Outpatient Follow-up:  Per orthopedics:  Up with PT/OT - DVT ppx - SCDs, ambulation, aspirin - NWB left lower extremity  TESTS THAT NEED FOLLOW-UP Please follow CBC, BMET next week on Monday   DIET: Heart healthy diet  Allergies:   Allergies  Allergen Reactions  . Latex Itching, Rash and Other (See Comments)    Bandaging and tape w/adhesive "sometimes get rash; sometimes pretty bad" Causes redness      Discharge Medications:   Medication List    TAKE these medications        aspirin EC 325 MG tablet  Take 1 tablet (325 mg total) by mouth 2 (two) times daily.     diazepam 2 MG tablet  Commonly known as:  VALIUM  Take 1 tablet (2 mg total) by mouth at bedtime as needed for anxiety.     DULoxetine 60 MG capsule  Commonly known as:  CYMBALTA  Take 1 capsule (60 mg total) by mouth daily.     gabapentin 300 MG capsule  Commonly known as:  NEURONTIN  Take 300 mg by mouth 2 (two) times daily.     HYDROcodone-acetaminophen 7.5-325 MG per tablet  Commonly known as:  NORCO  Take 1-2 tablets by mouth every 6 (six) hours as needed for moderate pain.         Brief H and P: For complete details please refer to admission H and P, but in brief 81 y.o. female with a past medical history significant for complete heart block s/p pacemaker placement, atrial fibrillation, neuropathy, from ILF who presented to [facility] ED status post fall from chair. Apparently, pt fell backwards while sitting on the chair and was unable to get  up. X ray studies on admission revealed left ankle dislocation and fracture.  Patient underwent external fixation left ankle fracture and debridement of bone associated with open fracture dislocation, left ankle 05/24/14. Plan to return to or tomorrow 5/11 for internal fixation of the left ankle.   Hospital Course:  Fall / left ankle fracture: Postop day # 2 after internal fixation Patient is a 81 year old female with complete heart block status post pacemaker, atrial fibrillation, neuropathy presented from independent living facility after ankle injury around no lid she fell from a bar stool. Patient was also noted to have about 10 cm transverse traumatic wound on the medial aspect of the ankle with exposed to distal tibia. Orthopedics was consulted. Patient was seen by Dr Roda Shutters. She underwent external fixation left ankle fracture and debridement of bone associated with open fracture dislocation, left ankle on 05/24/14. Ancef and tetanus injection was given ED. Patient subsequently underwent open internal fixation of the left trimalleolar ankle fracture, debridement of the bone, removal of external fixator on 05/27/11.  Orthopedics, Dr Roda Shutters recommended DVT prophylaxis with ambulation and aspirin twice a day, continue physical therapy and nonweightbearing on the left lower extremity.  Please follow with Dr Roda Shutters in 1 week.     Atrial fibrillation / Pacemaker  - CHADS vasc score 4, not on anticoagulation because of risk of falls. - Heart rate controlled.  Leukocytosis-  resolved - Likely reactive secondary to fall. - Has received preop cefazolin x once , UA negative for UTI   Essential hypertension - BP stable  Day of Discharge BP 141/47 mmHg  Pulse 63  Temp(Src) 97.5 F (36.4 C) (Oral)  Resp 16  Ht 5\' 7"  (1.702 m)  Wt 99.474 kg (219 lb 4.8 oz)  BMI 34.34 kg/m2  SpO2 97%  Physical Exam: General: Alert and awake oriented x3 not in any acute distress. HEENT: anicteric sclera, pupils reactive  to light and accommodation CVS: S1-S2 clear no murmur rubs or gallops Chest: clear to auscultation bilaterally, no wheezing rales or rhonchi Abdomen: soft nontender, nondistended, normal bowel sounds Extremities: no cyanosis, clubbing or edema noted bilaterally, left lower extremity in the cast Neuro: Cranial nerves II-XII intact, no focal neurological deficits   The results of significant diagnostics from this hospitalization (including imaging, microbiology, ancillary and laboratory) are listed below for reference.    LAB RESULTS: Basic Metabolic Panel:  Recent Labs Lab 05/28/14 0511 05/29/14 0325  NA 137 137  K 3.5 3.5  CL 104 102  CO2 24 26  GLUCOSE 167* 102*  BUN 10 12  CREATININE 0.75 0.71  CALCIUM 8.0* 8.0*   Liver Function Tests: No results for input(s): AST, ALT, ALKPHOS, BILITOT, PROT, ALBUMIN in the last 168 hours. No results for input(s): LIPASE, AMYLASE in the last 168 hours. No results for input(s): AMMONIA in the last 168 hours. CBC:  Recent Labs Lab 05/24/14 0025  05/28/14 0511 05/29/14 0325  WBC 13.0*  < > 9.1 6.7  NEUTROABS 11.5*  --   --   --   HGB 13.0  < > 10.4* 9.4*  HCT 38.5  < > 31.4* 29.0*  MCV 93.9  < > 94.9 95.4  PLT 224  < > 220 216  < > = values in this interval not displayed. Cardiac Enzymes:  Recent Labs Lab 05/24/14 0025  CKTOTAL 572*   BNP: Invalid input(s): POCBNP CBG: No results for input(s): GLUCAP in the last 168 hours.  Significant Diagnostic Studies:  Dg Ankle 2 Views Left  05/24/2014   CLINICAL DATA:  Left ankle fracture/dislocation.  EXAM: DG C-ARM 61-120 MIN; LEFT ANKLE - 2 VIEW  : COMPARISON:  Left ankle radiographs obtained earlier today.  FINDINGS: 8 C arm views of the left ankle demonstrate placement of hardware for external fixation of the previously described a ankle fracture/dislocation.  IMPRESSION: Operative placement of hardware for external fixation.   Electronically Signed   By: Beckie SaltsSteven  Reid M.D.   On:  05/24/2014 07:12   Ct Head Wo Contrast  05/24/2014   CLINICAL DATA:  Initial evaluation for acute trauma, fall.  EXAM: CT HEAD WITHOUT CONTRAST  CT CERVICAL SPINE WITHOUT CONTRAST  TECHNIQUE: Multidetector CT imaging of the head and cervical spine was performed following the standard protocol without intravenous contrast. Multiplanar CT image reconstructions of the cervical spine were also generated.  COMPARISON:  None.  FINDINGS: CT HEAD FINDINGS  Diffuse prominence of the CSF containing spaces is compatible with generalized cerebral atrophy. Patchy hypodensity within the periventricular and deep white matter both cerebral hemispheres most consistent with chronic small vessel ischemic disease. Remote left frontal infarct noted.  No acute large vessel territory infarct. No intracranial hemorrhage. No extra-axial fluid collection.  No mass lesion or midline shift.  No hydrocephalus.  Scalp soft tissues within normal limits. No acute abnormality about the orbits.  Calvarium intact.  Paranasal sinuses are clear.  No mastoid  effusion.  CT CERVICAL SPINE FINDINGS  Vertebral bodies are normally aligned with preservation of the normal cervical lordosis. Vertebral body heights are maintained. No acute fracture or listhesis.  Advanced degenerative osteoarthrosis present about the C1-2 articulation as well as at the atlanto occipital articulation. Prominent bridging anterior osteophytes extend from C4 through T2. Posterior osteophytic spurring present at C3-4.  No acute soft tissue abnormality within the neck. Vascular calcifications present about the carotid bifurcations. Visualized lungs are clear.  IMPRESSION: CT BRAIN:  1. No acute intracranial process. 2. Remote left frontal lobe infarct. 3. Generalized cerebral atrophy with chronic microvascular ischemic disease.  CT CERVICAL SPINE:  1. No acute traumatic injury within the cervical spine. 2. Advanced multilevel degenerative disc disease as above.   Electronically  Signed   By: Rise Mu M.D.   On: 05/24/2014 01:47   Ct Cervical Spine Wo Contrast  05/24/2014   CLINICAL DATA:  Initial evaluation for acute trauma, fall.  EXAM: CT HEAD WITHOUT CONTRAST  CT CERVICAL SPINE WITHOUT CONTRAST  TECHNIQUE: Multidetector CT imaging of the head and cervical spine was performed following the standard protocol without intravenous contrast. Multiplanar CT image reconstructions of the cervical spine were also generated.  COMPARISON:  None.  FINDINGS: CT HEAD FINDINGS  Diffuse prominence of the CSF containing spaces is compatible with generalized cerebral atrophy. Patchy hypodensity within the periventricular and deep white matter both cerebral hemispheres most consistent with chronic small vessel ischemic disease. Remote left frontal infarct noted.  No acute large vessel territory infarct. No intracranial hemorrhage. No extra-axial fluid collection.  No mass lesion or midline shift.  No hydrocephalus.  Scalp soft tissues within normal limits. No acute abnormality about the orbits.  Calvarium intact.  Paranasal sinuses are clear.  No mastoid effusion.  CT CERVICAL SPINE FINDINGS  Vertebral bodies are normally aligned with preservation of the normal cervical lordosis. Vertebral body heights are maintained. No acute fracture or listhesis.  Advanced degenerative osteoarthrosis present about the C1-2 articulation as well as at the atlanto occipital articulation. Prominent bridging anterior osteophytes extend from C4 through T2. Posterior osteophytic spurring present at C3-4.  No acute soft tissue abnormality within the neck. Vascular calcifications present about the carotid bifurcations. Visualized lungs are clear.  IMPRESSION: CT BRAIN:  1. No acute intracranial process. 2. Remote left frontal lobe infarct. 3. Generalized cerebral atrophy with chronic microvascular ischemic disease.  CT CERVICAL SPINE:  1. No acute traumatic injury within the cervical spine. 2. Advanced multilevel  degenerative disc disease as above.   Electronically Signed   By: Rise Mu M.D.   On: 05/24/2014 01:47   Ct Ankle Left Wo Contrast  05/24/2014   CLINICAL DATA:  S/P External fixation left ankle fractureDebridement of bone associated with open fracture dislocation, left ankleComplex wound repair of left medial ankleHx of bilateral knee and RT leg surgeries Hx of arthritis per pt  EXAM: CT OF THE LEFT ANKLE WITHOUT CONTRAST  TECHNIQUE: Multidetector CT imaging of the left ankle was performed according to the standard protocol. Multiplanar CT image reconstructions were also generated.  COMPARISON:  None.  FINDINGS: There are fractures of the distal tibia and fibula. There is a transverse fracture across the base the medial malleolus. An oblique comminuted fracture crosses the distal fibula from the posterior lateral meta diaphysis to the anterior metaphysis. The fibular fracture is mildly displaced laterally, with the major fracture components separated by 6 mm. No significant displacement of the medial malleolar fracture component. There is an  additional fracture along the posterior lateral margin of the distal tibia with a sliver of bone displacing laterally by 4 mm.  The ankle mortise is normally spaced and aligned.  The fractures are held in position with an external stabilizer with screws extending through the mid tibial shaft and to the body of the calcaneus.  Bones are diffusely demineralized. There is no convincing bone resorption to suggest osteomyelitis.  Soft tissue air is seen along the anterior and anterior lateral aspect of the ankle joint, along several of the extensor tendons sheaths, and along the medial aspect of the ankle. Air tracks superiorly along the lateral and medial aspects of the ankle and calf both along the ankle musculature and along the superficial muscle fascia. There is mild edema in the subcutaneous soft tissues of the ankle, but no formed fluid collection to suggest an  abscess. There is also no ankle joint effusion to suggest a septic arthritis.  IMPRESSION: 1. External fixation of fractures of the left ankle. There is an oblique comminuted fracture of the distal fibula, mildly displaced, with the major distal fracture component displacing laterally by 6 mm. There is a transverse fracture across the base of the medial malleolus, nondisplaced and a small sliver type fracture of the posterior lateral tibia, minimally displaced. 2. Ankle mortise is normally spaced and aligned. 3. There is soft tissue air in edema presumably from the recent wound debridement. No evidence of an abscess. 4. No ankle joint effusion. 5. No CT evidence of osteomyelitis.   Electronically Signed   By: Amie Portland M.D.   On: 05/24/2014 10:45   Dg Chest Port 1 View  05/24/2014   CLINICAL DATA:  Fall.  Left ankle deformity.  EXAM: PORTABLE CHEST - 1 VIEW  COMPARISON:  08/02/2011  FINDINGS: Pacemaker remains in place. Cardiac silhouette is upper limits of normal in size. The lungs are mildly hypoinflated without evidence of airspace consolidation, edema, pleural effusion, or pneumothorax. Right upper quadrant abdominal surgical clips are noted. No acute osseous abnormality is identified. Evidence chronic left rotator cuff tear.  IMPRESSION: No active disease.   Electronically Signed   By: Sebastian Ache   On: 05/24/2014 01:12   Dg Ankle Left Port  05/24/2014   CLINICAL DATA:  Left ankle pain and deformity after fall  EXAM: PORTABLE LEFT ANKLE - 2 VIEW  COMPARISON:  None.  FINDINGS: Two portable views of the left ankle demonstrate a fracture dislocation. The dislocation is probably lateral and posterior, but the exam is quite limited. Comminuted fragments are present about the distal fibula and tibia.  IMPRESSION: Very limited study, demonstrating a fracture dislocation about the left ankle.   Electronically Signed   By: Ellery Plunk M.D.   On: 05/24/2014 01:12   Dg C-arm 1-60 Min  05/24/2014    CLINICAL DATA:  Left ankle fracture/dislocation.  EXAM: DG C-ARM 61-120 MIN; LEFT ANKLE - 2 VIEW  : COMPARISON:  Left ankle radiographs obtained earlier today.  FINDINGS: 8 C arm views of the left ankle demonstrate placement of hardware for external fixation of the previously described a ankle fracture/dislocation.  IMPRESSION: Operative placement of hardware for external fixation.   Electronically Signed   By: Beckie Salts M.D.   On: 05/24/2014 07:12    2D ECHO:   Disposition and Follow-up:     Discharge Instructions    Diet - low sodium heart healthy    Complete by:  As directed      Increase activity slowly  Complete by:  As directed      Non weight bearing    Complete by:  As directed             DISPOSITION: Skilled nursing facility   DISCHARGE FOLLOW-UP Follow-up Information    Follow up with [name], [name], MD In 1 week.   Specialty:  Orthopedic Surgery   Why:  For wound re-check   Contact information:   [address] [place] [id number] [phone]       Follow up with Florentina JennyRIPP, HENRY, MD. Schedule an appointment as soon as possible for a visit in 2 weeks.   Specialty:  Family Medicine   Why:  for hospital follow-up   Contact information:   [address] [place] [address] [phone]        Time spent on Discharge: 35 mins  Signed:   Darshana Curnutt M.D. [facility] 05/29/2014, 12:18 PM Pager: [phone]

## 2014-05-30 DIAGNOSIS — I480 Paroxysmal atrial fibrillation: Secondary | ICD-10-CM

## 2014-05-30 DIAGNOSIS — G629 Polyneuropathy, unspecified: Secondary | ICD-10-CM

## 2014-05-30 LAB — BASIC METABOLIC PANEL
ANION GAP: 8 (ref 5–15)
BUN: 10 mg/dL (ref 6–20)
CALCIUM: 8.5 mg/dL — AB (ref 8.9–10.3)
CHLORIDE: 104 mmol/L (ref 101–111)
CO2: 27 mmol/L (ref 22–32)
Creatinine, Ser: 0.65 mg/dL (ref 0.44–1.00)
Glucose, Bld: 151 mg/dL — ABNORMAL HIGH (ref 65–99)
POTASSIUM: 3.5 mmol/L (ref 3.5–5.1)
Sodium: 139 mmol/L (ref 135–145)

## 2014-05-30 LAB — CBC
HEMATOCRIT: 31.6 % — AB (ref 36.0–46.0)
Hemoglobin: 10.6 g/dL — ABNORMAL LOW (ref 12.0–15.0)
MCH: 32 pg (ref 26.0–34.0)
MCHC: 33.5 g/dL (ref 30.0–36.0)
MCV: 95.5 fL (ref 78.0–100.0)
Platelets: 241 10*3/uL (ref 150–400)
RBC: 3.31 MIL/uL — ABNORMAL LOW (ref 3.87–5.11)
RDW: 14 % (ref 11.5–15.5)
WBC: 6.8 10*3/uL (ref 4.0–10.5)

## 2014-05-30 MED ORDER — DIAZEPAM 2 MG PO TABS
2.0000 mg | ORAL_TABLET | Freq: Every evening | ORAL | Status: DC | PRN
  Administered 2014-05-30 – 2014-05-31 (×2): 2 mg via ORAL
  Filled 2014-05-30 (×3): qty 1

## 2014-05-30 MED ORDER — GABAPENTIN 300 MG PO CAPS
300.0000 mg | ORAL_CAPSULE | Freq: Two times a day (BID) | ORAL | Status: DC
  Administered 2014-05-30 – 2014-06-01 (×5): 300 mg via ORAL
  Filled 2014-05-30 (×5): qty 1

## 2014-05-30 MED ORDER — DULOXETINE HCL 60 MG PO CPEP
60.0000 mg | ORAL_CAPSULE | Freq: Every day | ORAL | Status: DC
  Administered 2014-05-30 – 2014-06-01 (×3): 60 mg via ORAL
  Filled 2014-05-30 (×3): qty 1

## 2014-05-30 MED ORDER — DIAZEPAM 2 MG PO TABS: 2.0000 mg | ORAL_TABLET | Freq: Every day | ORAL | Status: DC

## 2014-05-30 MED ORDER — POLYETHYLENE GLYCOL 3350 17 G PO PACK
17.0000 g | PACK | Freq: Two times a day (BID) | ORAL | Status: DC
Start: 2014-05-30 — End: 2014-06-01
  Administered 2014-05-30 – 2014-06-01 (×3): 17 g via ORAL
  Filled 2014-05-30 (×3): qty 1

## 2014-05-30 NOTE — Progress Notes (Signed)
[facility]                                                                              Patient Demographics  [name], is a 81 y.o. female, DOB - 02/16/1919, ZOX:[id number]  Admit date - 05/23/2014   Admitting Physician Ron Parker, MD  Outpatient Primary MD for the patient is [name], MD  LOS - 6   Chief Complaint  Patient presents with  . Fall    open fracture to left lower leg       Brief HPI   81 y.o. female with a past medical history significant for complete heart block s/p pacemaker placement, atrial fibrillation, neuropathy, from ILF who presented to [facility] ED status post fall from chair. Apparently, pt fell backwards while sitting on the chair and was unable to get up. X ray studies on admission revealed left ankle dislocation and fracture.  Patient underwent external fixation left ankle fracture and debridement of bone associated with open fracture dislocation, left ankle 05/24/14. Plan to return to or tomorrow 5/11 for internal fixation of the left ankle.    Assessment & Plan    Fall / left ankle fracture: Postop day #1 - Pt is s/p external fixation left ankle fracture and debridement of bone associated with open fracture dislocation, left ankle done 05/24/14. - Status post open internal fixation of the left trimalleolar ankle fracture, debridement of the bone, removal of external fixator  -PT following.  - Pain controlled  -Management per Dr Roda Shutters.  -add Bowel regimen.   Atrial fibrillation / Pacemaker  - CHADS vasc score 4, not on anticoagulation because of risk of falls. Currently she is on subcutaneous heparin for DVT prophylaxis. - Heart rate controlled.  Acute blood loss anemia; post op expected.  Will repeat Hb today.   Leukocytosis- resolved - Likely reactive secondary to fall. - Has received preop cefazolin x once , UA negative for UTI   Essential hypertension - BP stable  Stage 2 buttock;  Local care.   DVT  Prophylaxis on aspirin for DVT prophylaxis.   Code Status: Full code  Family Communication: Discussed in detail with the patient, all imaging results, lab results explained to the patient  Disposition Plan: Likely to skilled nursing facility Monday,.   Time Spent in minutes   25 minutes  Procedures  External fixation of the left ankle fracture on 5/8 Debridement of the bone associated with open fracture dislocation, left ankle and complex wound repair of left medial ankle wound on 5/8  Consults   Orthopedics, Dr Roda Shutters  DVT Prophylaxis  heparin subcutaneous  Medications  Scheduled Meds: . aspirin EC  325 mg Oral BID  . feeding supplement (ENSURE ENLIVE)  237 mL Oral BID BM  . sodium chloride  3 mL Intravenous Q12H   Continuous Infusions:   PRN Meds:.acetaminophen **OR** acetaminophen, alum & mag hydroxide-simeth, HYDROmorphone (DILAUDID) injection, ondansetron **OR** ondansetron (ZOFRAN) IV, oxyCODONE   Antibiotics   Anti-infectives    Start     Dose/Rate Route Frequency Ordered Stop   05/23/14 2345  ceFAZolin (ANCEF) IVPB 1 g/50 mL premix  1 g 100 mL/hr over 30 Minutes Intravenous  Once 05/23/14 2340 05/24/14 0019        Subjective:   [name] was seen and examined today. No new complaints. She is feeling well today.  Yesterday she didn't feel like moving.  She doesn't remember, when was her last BM She is breathing well.    Objective:   Blood pressure 112/65, pulse 75, temperature 98.3 F (36.8 C), temperature source Oral, resp. rate 17, height  (1.702 m), weight 99.474 kg (219 lb 4.8 oz), SpO2 99 %.  Wt Readings from Last 3 Encounters:  05/24/14 99.474 kg (219 lb 4.8 oz)  09/11/11 84.369 kg (186 lb)  08/02/11 87.726 kg (193 lb 6.4 oz)     Intake/Output Summary (Last 24 hours) at 05/30/14 0759 Last data filed at 05/30/14 0050  Gross per 24 hour  Intake    240 ml  Output   1400 ml  Net  -1160 ml    Exam  General: Alert and oriented x  3, NAD  HEENT:  PERRLA, EOMI  Neck: Supple, no JVD, no masses  CVS: S1 S2 clear, RRR  Respiratory: CTAB  Abdomen: Soft, nontender, nondistended, + bowel sounds  Ext: no cyanosis clubbing, left lower leg in cast  Neuro: exam limited  Skin: No rashes    Data Review   Micro Results Recent Results (from the past 240 hour(s))  Urine culture     Status: None   Collection Time: 05/25/14  1:30 PM  Result Value Ref Range Status   Specimen Description URINE, RANDOM  Final   Special Requests NONE  Final   Colony Count NO GROWTH Performed at [facility]   Final   Culture NO GROWTH Performed at [facility]   Final   Report Status 05/26/2014 FINAL  Final    Radiology Reports Dg Shoulder Right  05/25/2014   CLINICAL DATA:  Acute right shoulder pain after recent fall. Initial encounter.  EXAM: RIGHT SHOULDER - 2+ VIEW  COMPARISON:  None.  FINDINGS: No fracture or dislocation is noted. Visualized ribs appear normal. Severe narrowing of subacromial space is noted consistent with rotator cuff injury. Degenerative change of right glenohumeral and acromioclavicular joints is noted as well.  IMPRESSION: Degenerative changes of right glenohumeral and acromioclavicular joints is noted as well as severe subacromial joint space narrowing suggesting rotator cuff injury. No acute abnormality seen in the right shoulder.   Electronically Signed   By: Lupita Raider, M.D.   On: 05/25/2014 09:32   Dg Ankle 2 Views Left  05/24/2014   CLINICAL DATA:  Left ankle fracture/dislocation.  EXAM: DG C-ARM 61-120 MIN; LEFT ANKLE - 2 VIEW  : COMPARISON:  Left ankle radiographs obtained earlier today.  FINDINGS: 8 C arm views of the left ankle demonstrate placement of hardware for external fixation of the previously described a ankle fracture/dislocation.  IMPRESSION: Operative placement of hardware for external fixation.   Electronically Signed   By: Beckie Salts M.D.   On: 05/24/2014 07:12   Dg  Ankle Complete Left  05/27/2014   CLINICAL DATA:  ORIF left ankle fracture  EXAM: DG C-ARM 61-120 MIN; LEFT ANKLE COMPLETE - 3+ VIEW  FLUOROSCOPY TIME:  Fluoroscopy Time (in minutes and seconds): 51 seconds  Number of Acquired Images:  3  COMPARISON:  CT left lower extremity dated 05/24/2014  FINDINGS: Frontal and lateral fluoroscopic spot images during ORIF of bimalleolar fractures.  Lateral compression plate and screw fixation  of the distal fibula.  Two cannulated cancellous screws through the medial malleolus.  A single distal syndesmotic screw.  IMPRESSION: Fluoroscopic spot images during ORIF of the distal tibia and fibula, as above.   Electronically Signed   By: Charline BillsSriyesh  Krishnan M.D.   On: 05/27/2014 10:41   Ct Head Wo Contrast  05/24/2014   CLINICAL DATA:  Initial evaluation for acute trauma, fall.  EXAM: CT HEAD WITHOUT CONTRAST  CT CERVICAL SPINE WITHOUT CONTRAST  TECHNIQUE: Multidetector CT imaging of the head and cervical spine was performed following the standard protocol without intravenous contrast. Multiplanar CT image reconstructions of the cervical spine were also generated.  COMPARISON:  None.  FINDINGS: CT HEAD FINDINGS  Diffuse prominence of the CSF containing spaces is compatible with generalized cerebral atrophy. Patchy hypodensity within the periventricular and deep white matter both cerebral hemispheres most consistent with chronic small vessel ischemic disease. Remote left frontal infarct noted.  No acute large vessel territory infarct. No intracranial hemorrhage. No extra-axial fluid collection.  No mass lesion or midline shift.  No hydrocephalus.  Scalp soft tissues within normal limits. No acute abnormality about the orbits.  Calvarium intact.  Paranasal sinuses are clear.  No mastoid effusion.  CT CERVICAL SPINE FINDINGS  Vertebral bodies are normally aligned with preservation of the normal cervical lordosis. Vertebral body heights are maintained. No acute fracture or listhesis.   Advanced degenerative osteoarthrosis present about the C1-2 articulation as well as at the atlanto occipital articulation. Prominent bridging anterior osteophytes extend from C4 through T2. Posterior osteophytic spurring present at C3-4.  No acute soft tissue abnormality within the neck. Vascular calcifications present about the carotid bifurcations. Visualized lungs are clear.  IMPRESSION: CT BRAIN:  1. No acute intracranial process. 2. Remote left frontal lobe infarct. 3. Generalized cerebral atrophy with chronic microvascular ischemic disease.  CT CERVICAL SPINE:  1. No acute traumatic injury within the cervical spine. 2. Advanced multilevel degenerative disc disease as above.   Electronically Signed   By: Rise MuBenjamin  McClintock M.D.   On: 05/24/2014 01:47   Ct Cervical Spine Wo Contrast  05/24/2014   CLINICAL DATA:  Initial evaluation for acute trauma, fall.  EXAM: CT HEAD WITHOUT CONTRAST  CT CERVICAL SPINE WITHOUT CONTRAST  TECHNIQUE: Multidetector CT imaging of the head and cervical spine was performed following the standard protocol without intravenous contrast. Multiplanar CT image reconstructions of the cervical spine were also generated.  COMPARISON:  None.  FINDINGS: CT HEAD FINDINGS  Diffuse prominence of the CSF containing spaces is compatible with generalized cerebral atrophy. Patchy hypodensity within the periventricular and deep white matter both cerebral hemispheres most consistent with chronic small vessel ischemic disease. Remote left frontal infarct noted.  No acute large vessel territory infarct. No intracranial hemorrhage. No extra-axial fluid collection.  No mass lesion or midline shift.  No hydrocephalus.  Scalp soft tissues within normal limits. No acute abnormality about the orbits.  Calvarium intact.  Paranasal sinuses are clear.  No mastoid effusion.  CT CERVICAL SPINE FINDINGS  Vertebral bodies are normally aligned with preservation of the normal cervical lordosis. Vertebral body  heights are maintained. No acute fracture or listhesis.  Advanced degenerative osteoarthrosis present about the C1-2 articulation as well as at the atlanto occipital articulation. Prominent bridging anterior osteophytes extend from C4 through T2. Posterior osteophytic spurring present at C3-4.  No acute soft tissue abnormality within the neck. Vascular calcifications present about the carotid bifurcations. Visualized lungs are clear.  IMPRESSION: CT BRAIN:  1.  No acute intracranial process. 2. Remote left frontal lobe infarct. 3. Generalized cerebral atrophy with chronic microvascular ischemic disease.  CT CERVICAL SPINE:  1. No acute traumatic injury within the cervical spine. 2. Advanced multilevel degenerative disc disease as above.   Electronically Signed   By: Rise Mu M.D.   On: 05/24/2014 01:47   Ct Ankle Left Wo Contrast  05/24/2014   CLINICAL DATA:  S/P External fixation left ankle fractureDebridement of bone associated with open fracture dislocation, left ankleComplex wound repair of left medial ankleHx of bilateral knee and RT leg surgeries Hx of arthritis per pt  EXAM: CT OF THE LEFT ANKLE WITHOUT CONTRAST  TECHNIQUE: Multidetector CT imaging of the left ankle was performed according to the standard protocol. Multiplanar CT image reconstructions were also generated.  COMPARISON:  None.  FINDINGS: There are fractures of the distal tibia and fibula. There is a transverse fracture across the base the medial malleolus. An oblique comminuted fracture crosses the distal fibula from the posterior lateral meta diaphysis to the anterior metaphysis. The fibular fracture is mildly displaced laterally, with the major fracture components separated by 6 mm. No significant displacement of the medial malleolar fracture component. There is an additional fracture along the posterior lateral margin of the distal tibia with a sliver of bone displacing laterally by 4 mm.  The ankle mortise is normally spaced  and aligned.  The fractures are held in position with an external stabilizer with screws extending through the mid tibial shaft and to the body of the calcaneus.  Bones are diffusely demineralized. There is no convincing bone resorption to suggest osteomyelitis.  Soft tissue air is seen along the anterior and anterior lateral aspect of the ankle joint, along several of the extensor tendons sheaths, and along the medial aspect of the ankle. Air tracks superiorly along the lateral and medial aspects of the ankle and calf both along the ankle musculature and along the superficial muscle fascia. There is mild edema in the subcutaneous soft tissues of the ankle, but no formed fluid collection to suggest an abscess. There is also no ankle joint effusion to suggest a septic arthritis.  IMPRESSION: 1. External fixation of fractures of the left ankle. There is an oblique comminuted fracture of the distal fibula, mildly displaced, with the major distal fracture component displacing laterally by 6 mm. There is a transverse fracture across the base of the medial malleolus, nondisplaced and a small sliver type fracture of the posterior lateral tibia, minimally displaced. 2. Ankle mortise is normally spaced and aligned. 3. There is soft tissue air in edema presumably from the recent wound debridement. No evidence of an abscess. 4. No ankle joint effusion. 5. No CT evidence of osteomyelitis.   Electronically Signed   By: Amie Portland M.D.   On: 05/24/2014 10:45   Dg Chest Port 1 View  05/24/2014   CLINICAL DATA:  Fall.  Left ankle deformity.  EXAM: PORTABLE CHEST - 1 VIEW  COMPARISON:  08/02/2011  FINDINGS: Pacemaker remains in place. Cardiac silhouette is upper limits of normal in size. The lungs are mildly hypoinflated without evidence of airspace consolidation, edema, pleural effusion, or pneumothorax. Right upper quadrant abdominal surgical clips are noted. No acute osseous abnormality is identified. Evidence chronic left  rotator cuff tear.  IMPRESSION: No active disease.   Electronically Signed   By: Sebastian Ache   On: 05/24/2014 01:12   Dg Ankle Left Port  05/24/2014   CLINICAL DATA:  Left ankle pain  and deformity after fall  EXAM: PORTABLE LEFT ANKLE - 2 VIEW  COMPARISON:  None.  FINDINGS: Two portable views of the left ankle demonstrate a fracture dislocation. The dislocation is probably lateral and posterior, but the exam is quite limited. Comminuted fragments are present about the distal fibula and tibia.  IMPRESSION: Very limited study, demonstrating a fracture dislocation about the left ankle.   Electronically Signed   By: Ellery Plunkaniel R Mitchell M.D.   On: 05/24/2014 01:12   Dg Humerus Right  05/25/2014   CLINICAL DATA:  Fall.  Pain.  EXAM: RIGHT HUMERUS - 2+ VIEW  COMPARISON:  08/02/2011.  FINDINGS: Prominent degenerative changes right shoulder. Severe acromioclavicular glenohumeral degenerative change. Right shoulder is high-riding consistent with rotator cuff tear. No acute bony abnormality identified.  IMPRESSION: Severe acromioclavicular glenohumeral degenerative change. High-riding right shoulder consistent with rotator cuff tear. No acute abnormality. Similar findings noted on prior exam.   Electronically Signed   By: Maisie Fushomas  Register   On: 05/25/2014 09:31   Dg C-arm 1-60 Min  05/27/2014   CLINICAL DATA:  ORIF left ankle fracture  EXAM: DG C-ARM 61-120 MIN; LEFT ANKLE COMPLETE - 3+ VIEW  FLUOROSCOPY TIME:  Fluoroscopy Time (in minutes and seconds): 51 seconds  Number of Acquired Images:  3  COMPARISON:  CT left lower extremity dated 05/24/2014  FINDINGS: Frontal and lateral fluoroscopic spot images during ORIF of bimalleolar fractures.  Lateral compression plate and screw fixation of the distal fibula.  Two cannulated cancellous screws through the medial malleolus.  A single distal syndesmotic screw.  IMPRESSION: Fluoroscopic spot images during ORIF of the distal tibia and fibula, as above.   Electronically Signed    By: Charline BillsSriyesh  Krishnan M.D.   On: 05/27/2014 10:41   Dg C-arm 1-60 Min  05/24/2014   CLINICAL DATA:  Left ankle fracture/dislocation.  EXAM: DG C-ARM 61-120 MIN; LEFT ANKLE - 2 VIEW  : COMPARISON:  Left ankle radiographs obtained earlier today.  FINDINGS: 8 C arm views of the left ankle demonstrate placement of hardware for external fixation of the previously described a ankle fracture/dislocation.  IMPRESSION: Operative placement of hardware for external fixation.   Electronically Signed   By: Beckie SaltsSteven  Reid M.D.   On: 05/24/2014 07:12    CBC  Recent Labs Lab 05/24/14 0025 05/24/14 0750 05/26/14 0640 05/27/14 0517 05/28/14 0511 05/29/14 0325  WBC 13.0* 13.4* 9.0 6.4 9.1 6.7  HGB 13.0 13.0 10.1* 10.1* 10.4* 9.4*  HCT 38.5 38.4 29.9* 31.4* 31.4* 29.0*  PLT 224 210 187 207 220 216  MCV 93.9 94.8 97.4 96.3 94.9 95.4  MCH 31.7 32.1 32.9 31.0 31.4 30.9  MCHC 33.8 33.9 33.8 32.2 33.1 32.4  RDW 13.7 13.8 14.0 13.8 13.8 14.1  LYMPHSABS 0.6*  --   --   --   --   --   MONOABS 0.9  --   --   --   --   --   EOSABS 0.0  --   --   --   --   --   BASOSABS 0.0  --   --   --   --   --     Chemistries   Recent Labs Lab 05/24/14 0750 05/26/14 0640 05/27/14 0517 05/28/14 0511 05/29/14 0325  NA 140 138 138 137 137  K 4.5 3.7 3.5 3.5 3.5  CL 104 105 105 104 102  CO2 26 24 25 24 26   GLUCOSE 132* 101* 98 167* 102*  BUN 22* 19  13 10 12   CREATININE 0.79 0.65 0.56 0.75 0.71  CALCIUM 9.1 8.0* 8.0* 8.0* 8.0*   ------------------------------------------------------------------------------------------------------------------ estimated creatinine clearance is 54.4 mL/min (by C-G formula based on Cr of 0.71). ------------------------------------------------------------------------------------------------------------------ No results for input(s): HGBA1C in the last 72 hours. ------------------------------------------------------------------------------------------------------------------ No results  for input(s): CHOL, HDL, LDLCALC, TRIG, CHOLHDL, LDLDIRECT in the last 72 hours. ------------------------------------------------------------------------------------------------------------------ No results for input(s): TSH, T4TOTAL, T3FREE, THYROIDAB in the last 72 hours.  Invalid input(s): FREET3 ------------------------------------------------------------------------------------------------------------------ No results for input(s): VITAMINB12, FOLATE, FERRITIN, TIBC, IRON, RETICCTPCT in the last 72 hours.  Coagulation profile No results for input(s): INR, PROTIME in the last 168 hours.  No results for input(s): DDIMER in the last 72 hours.  Cardiac Enzymes No results for input(s): CKMB, TROPONINI, MYOGLOBIN in the last 168 hours.  Invalid input(s): CK ------------------------------------------------------------------------------------------------------------------ Invalid input(s): POCBNP  No results for input(s): GLUCAP in the last 72 hours.   Hartley Barefoot A M.D. [facility] 05/30/2014, 7:59 AM  Pager: [phone]  Between 7am to 7pm - call Pager - [phone]  After 7pm go to [email or web address] - password [email or web address]  Call night coverage person covering after 7pm

## 2014-05-30 NOTE — Progress Notes (Signed)
Patient ID: [name], female   DOB: 04/20/1919, 81 y.o.   MRN: [id number] No acute changes. Comfortable. Her left ankle splint is well-padded.  Her toes are well-perfused.  She is working with therapy and [facility]

## 2014-05-31 DIAGNOSIS — I482 Chronic atrial fibrillation: Secondary | ICD-10-CM

## 2014-05-31 DIAGNOSIS — I459 Conduction disorder, unspecified: Secondary | ICD-10-CM

## 2014-05-31 LAB — BASIC METABOLIC PANEL
ANION GAP: 9 (ref 5–15)
BUN: 16 mg/dL (ref 6–20)
CO2: 26 mmol/L (ref 22–32)
Calcium: 8.5 mg/dL — ABNORMAL LOW (ref 8.9–10.3)
Chloride: 104 mmol/L (ref 101–111)
Creatinine, Ser: 0.6 mg/dL (ref 0.44–1.00)
GFR calc Af Amer: >60 mL/min (ref >60)
GFR calc non Af Amer: >60 mL/min (ref >60)
GLUCOSE: 87 mg/dL (ref 65–99)
Potassium: 3.8 mmol/L (ref 3.5–5.1)
Sodium: 139 mmol/L (ref 135–145)

## 2014-05-31 NOTE — Progress Notes (Signed)
Patient ID: [name], female   DOB: 07/24/1919, 81 y.o.   MRN: [id number] No change reports minimal pain in left ankle. Splint clean dry and intact . Calf supple and non tender. Able to wiggle toes and sensation intact. Dispo SNF early this week.

## 2014-05-31 NOTE — Progress Notes (Signed)
[facility]                                                                              Patient Demographics  [name], is a 81 y.o. female, DOB - 1920-01-12, EAV:[id number]  Admit date - 05/23/2014   Admitting Physician Ron Parker, MD  Outpatient Primary MD for the patient is [name] Jenny, MD  LOS - 7   Chief Complaint  Patient presents with  . Fall    open fracture to left lower leg       Brief HPI   81 y.o. female with a past medical history significant for complete heart block s/p pacemaker placement, atrial fibrillation, neuropathy, from ILF who presented to [facility] ED status post fall from chair. Apparently, pt fell backwards while sitting on the chair and was unable to get up. X ray studies on admission revealed left ankle dislocation and fracture.  Patient underwent external fixation left ankle fracture and debridement of bone associated with open fracture dislocation, left ankle 05/24/14.  5/11 for internal fixation of the left ankle.    Assessment & Plan    Fall / left ankle fracture:  - Pt is s/p external fixation left ankle fracture and debridement of bone associated with open fracture dislocation, left ankle done 05/24/14. - Status post open internal fixation of the left trimalleolar ankle fracture, debridement of the bone, removal of external fixator  -PT following.  - Pain controlled  -Management per Dr Roda Shutters.  - Bowel regimen.  -per ortho discharge patient on Monday.   Atrial fibrillation / Pacemaker  - CHADS vasc score 4, not on anticoagulation because of risk of falls. Currently she is on subcutaneous heparin for DVT prophylaxis. - Heart rate controlled.  Acute blood loss anemia; post op expected.  Hb stable.   Leukocytosis- resolved - Likely reactive secondary to fall. - Has received preop cefazolin x once , UA negative for UTI   Essential hypertension - BP stable  Stage 2 buttock;  Local care.   DVT Prophylaxis on  aspirin for DVT prophylaxis.   Code Status: Full code  Family Communication: Discussed in detail with the patient, all imaging results, lab results explained to the patient  Disposition Plan: Likely to skilled nursing facility Monday,.   Time Spent in minutes   15 minutes  Procedures  External fixation of the left ankle fracture on 5/8 Debridement of the bone associated with open fracture dislocation, left ankle and complex wound repair of left medial ankle wound on 5/8  Consults   Orthopedics, Dr Roda Shutters  DVT Prophylaxis  heparin subcutaneous  Medications  Scheduled Meds: . aspirin EC  325 mg Oral BID  . DULoxetine  60 mg Oral Daily  . feeding supplement (ENSURE ENLIVE)  237 mL Oral BID BM  . gabapentin  300 mg Oral BID  . polyethylene glycol  17 g Oral BID  . sodium chloride  3 mL Intravenous Q12H   Continuous Infusions:   PRN Meds:.acetaminophen **OR** acetaminophen, alum & mag hydroxide-simeth, diazepam, ondansetron **OR** ondansetron (ZOFRAN) IV, oxyCODONE   Antibiotics   Anti-infectives    Start  Dose/Rate Route Frequency Ordered Stop   05/23/14 2345  ceFAZolin (ANCEF) IVPB 1 g/50 mL premix     1 g 100 mL/hr over 30 Minutes Intravenous  Once 05/23/14 2340 05/24/14 0019        Subjective:   Torian Thoennes was seen and examined today. No new complaints. Denies abdominal pain, had a BM. Mild pain ankle.    Objective:   Blood pressure 124/52, pulse 65, temperature 98.7 F (37.1 C), temperature source Oral, resp. rate 16, height 5\' 7"  (1.702 m), weight 99.474 kg (219 lb 4.8 oz), SpO2 98 %.  Wt Readings from Last 3 Encounters:  05/24/14 99.474 kg (219 lb 4.8 oz)  09/11/11 84.369 kg (186 lb)  08/02/11 87.726 kg (193 lb 6.4 oz)     Intake/Output Summary (Last 24 hours) at 05/31/14 1519 Last data filed at 05/31/14 1034  Gross per 24 hour  Intake    883 ml  Output      0 ml  Net    883 ml    Exam  General: Alert and oriented x 3, NAD  HEENT:   PERRLA, EOMI  Neck: Supple, no JVD, no masses  CVS: S1 S2 clear, RRR  Respiratory: CTAB  Abdomen: Soft, nontender, nondistended, + bowel sounds  Ext: no cyanosis clubbing, left lower leg in cast     Data Review   Micro Results Recent Results (from the past 240 hour(s))  Urine culture     Status: None   Collection Time: 05/25/14  1:30 PM  Result Value Ref Range Status   Specimen Description URINE, RANDOM  Final   Special Requests NONE  Final   Colony Count NO GROWTH Performed at [facility]   Final   Culture NO GROWTH Performed at [facility]   Final   Report Status 05/26/2014 FINAL  Final    Radiology Reports Dg Shoulder Right  05/25/2014   CLINICAL DATA:  Acute right shoulder pain after recent fall. Initial encounter.  EXAM: RIGHT SHOULDER - 2+ VIEW  COMPARISON:  None.  FINDINGS: No fracture or dislocation is noted. Visualized ribs appear normal. Severe narrowing of subacromial space is noted consistent with rotator cuff injury. Degenerative change of right glenohumeral and acromioclavicular joints is noted as well.  IMPRESSION: Degenerative changes of right glenohumeral and acromioclavicular joints is noted as well as severe subacromial joint space narrowing suggesting rotator cuff injury. No acute abnormality seen in the right shoulder.   Electronically Signed   By: Lupita Raider, M.D.   On: 05/25/2014 09:32   Dg Ankle 2 Views Left  05/24/2014   CLINICAL DATA:  Left ankle fracture/dislocation.  EXAM: DG C-ARM 61-120 MIN; LEFT ANKLE - 2 VIEW  : COMPARISON:  Left ankle radiographs obtained earlier today.  FINDINGS: 8 C arm views of the left ankle demonstrate placement of hardware for external fixation of the previously described a ankle fracture/dislocation.  IMPRESSION: Operative placement of hardware for external fixation.   Electronically Signed   By: Beckie Salts M.D.   On: 05/24/2014 07:12   Dg Ankle Complete Left  05/27/2014   CLINICAL DATA:  ORIF  left ankle fracture  EXAM: DG C-ARM 61-120 MIN; LEFT ANKLE COMPLETE - 3+ VIEW  FLUOROSCOPY TIME:  Fluoroscopy Time (in minutes and seconds): 51 seconds  Number of Acquired Images:  3  COMPARISON:  CT left lower extremity dated 05/24/2014  FINDINGS: Frontal and lateral fluoroscopic spot images during ORIF of bimalleolar fractures.  Lateral compression plate  and screw fixation of the distal fibula.  Two cannulated cancellous screws through the medial malleolus.  A single distal syndesmotic screw.  IMPRESSION: Fluoroscopic spot images during ORIF of the distal tibia and fibula, as above.   Electronically Signed   By: Charline BillsSriyesh  Krishnan M.D.   On: 05/27/2014 10:41   Ct Head Wo Contrast  05/24/2014   CLINICAL DATA:  Initial evaluation for acute trauma, fall.  EXAM: CT HEAD WITHOUT CONTRAST  CT CERVICAL SPINE WITHOUT CONTRAST  TECHNIQUE: Multidetector CT imaging of the head and cervical spine was performed following the standard protocol without intravenous contrast. Multiplanar CT image reconstructions of the cervical spine were also generated.  COMPARISON:  None.  FINDINGS: CT HEAD FINDINGS  Diffuse prominence of the CSF containing spaces is compatible with generalized cerebral atrophy. Patchy hypodensity within the periventricular and deep white matter both cerebral hemispheres most consistent with chronic small vessel ischemic disease. Remote left frontal infarct noted.  No acute large vessel territory infarct. No intracranial hemorrhage. No extra-axial fluid collection.  No mass lesion or midline shift.  No hydrocephalus.  Scalp soft tissues within normal limits. No acute abnormality about the orbits.  Calvarium intact.  Paranasal sinuses are clear.  No mastoid effusion.  CT CERVICAL SPINE FINDINGS  Vertebral bodies are normally aligned with preservation of the normal cervical lordosis. Vertebral body heights are maintained. No acute fracture or listhesis.  Advanced degenerative osteoarthrosis present about the C1-2  articulation as well as at the atlanto occipital articulation. Prominent bridging anterior osteophytes extend from C4 through T2. Posterior osteophytic spurring present at C3-4.  No acute soft tissue abnormality within the neck. Vascular calcifications present about the carotid bifurcations. Visualized lungs are clear.  IMPRESSION: CT BRAIN:  1. No acute intracranial process. 2. Remote left frontal lobe infarct. 3. Generalized cerebral atrophy with chronic microvascular ischemic disease.  CT CERVICAL SPINE:  1. No acute traumatic injury within the cervical spine. 2. Advanced multilevel degenerative disc disease as above.   Electronically Signed   By: Rise MuBenjamin  McClintock M.D.   On: 05/24/2014 01:47   Ct Cervical Spine Wo Contrast  05/24/2014   CLINICAL DATA:  Initial evaluation for acute trauma, fall.  EXAM: CT HEAD WITHOUT CONTRAST  CT CERVICAL SPINE WITHOUT CONTRAST  TECHNIQUE: Multidetector CT imaging of the head and cervical spine was performed following the standard protocol without intravenous contrast. Multiplanar CT image reconstructions of the cervical spine were also generated.  COMPARISON:  None.  FINDINGS: CT HEAD FINDINGS  Diffuse prominence of the CSF containing spaces is compatible with generalized cerebral atrophy. Patchy hypodensity within the periventricular and deep white matter both cerebral hemispheres most consistent with chronic small vessel ischemic disease. Remote left frontal infarct noted.  No acute large vessel territory infarct. No intracranial hemorrhage. No extra-axial fluid collection.  No mass lesion or midline shift.  No hydrocephalus.  Scalp soft tissues within normal limits. No acute abnormality about the orbits.  Calvarium intact.  Paranasal sinuses are clear.  No mastoid effusion.  CT CERVICAL SPINE FINDINGS  Vertebral bodies are normally aligned with preservation of the normal cervical lordosis. Vertebral body heights are maintained. No acute fracture or listhesis.  Advanced  degenerative osteoarthrosis present about the C1-2 articulation as well as at the atlanto occipital articulation. Prominent bridging anterior osteophytes extend from C4 through T2. Posterior osteophytic spurring present at C3-4.  No acute soft tissue abnormality within the neck. Vascular calcifications present about the carotid bifurcations. Visualized lungs are clear.  IMPRESSION: CT  BRAIN:  1. No acute intracranial process. 2. Remote left frontal lobe infarct. 3. Generalized cerebral atrophy with chronic microvascular ischemic disease.  CT CERVICAL SPINE:  1. No acute traumatic injury within the cervical spine. 2. Advanced multilevel degenerative disc disease as above.   Electronically Signed   By: Rise MuBenjamin  McClintock M.D.   On: 05/24/2014 01:47   Ct Ankle Left Wo Contrast  05/24/2014   CLINICAL DATA:  S/P External fixation left ankle fractureDebridement of bone associated with open fracture dislocation, left ankleComplex wound repair of left medial ankleHx of bilateral knee and RT leg surgeries Hx of arthritis per pt  EXAM: CT OF THE LEFT ANKLE WITHOUT CONTRAST  TECHNIQUE: Multidetector CT imaging of the left ankle was performed according to the standard protocol. Multiplanar CT image reconstructions were also generated.  COMPARISON:  None.  FINDINGS: There are fractures of the distal tibia and fibula. There is a transverse fracture across the base the medial malleolus. An oblique comminuted fracture crosses the distal fibula from the posterior lateral meta diaphysis to the anterior metaphysis. The fibular fracture is mildly displaced laterally, with the major fracture components separated by 6 mm. No significant displacement of the medial malleolar fracture component. There is an additional fracture along the posterior lateral margin of the distal tibia with a sliver of bone displacing laterally by 4 mm.  The ankle mortise is normally spaced and aligned.  The fractures are held in position with an external  stabilizer with screws extending through the mid tibial shaft and to the body of the calcaneus.  Bones are diffusely demineralized. There is no convincing bone resorption to suggest osteomyelitis.  Soft tissue air is seen along the anterior and anterior lateral aspect of the ankle joint, along several of the extensor tendons sheaths, and along the medial aspect of the ankle. Air tracks superiorly along the lateral and medial aspects of the ankle and calf both along the ankle musculature and along the superficial muscle fascia. There is mild edema in the subcutaneous soft tissues of the ankle, but no formed fluid collection to suggest an abscess. There is also no ankle joint effusion to suggest a septic arthritis.  IMPRESSION: 1. External fixation of fractures of the left ankle. There is an oblique comminuted fracture of the distal fibula, mildly displaced, with the major distal fracture component displacing laterally by 6 mm. There is a transverse fracture across the base of the medial malleolus, nondisplaced and a small sliver type fracture of the posterior lateral tibia, minimally displaced. 2. Ankle mortise is normally spaced and aligned. 3. There is soft tissue air in edema presumably from the recent wound debridement. No evidence of an abscess. 4. No ankle joint effusion. 5. No CT evidence of osteomyelitis.   Electronically Signed   By: Amie Portlandavid  Ormond M.D.   On: 05/24/2014 10:45   Dg Chest Port 1 View  05/24/2014   CLINICAL DATA:  Fall.  Left ankle deformity.  EXAM: PORTABLE CHEST - 1 VIEW  COMPARISON:  08/02/2011  FINDINGS: Pacemaker remains in place. Cardiac silhouette is upper limits of normal in size. The lungs are mildly hypoinflated without evidence of airspace consolidation, edema, pleural effusion, or pneumothorax. Right upper quadrant abdominal surgical clips are noted. No acute osseous abnormality is identified. Evidence chronic left rotator cuff tear.  IMPRESSION: No active disease.   Electronically  Signed   By: Sebastian AcheAllen  Grady   On: 05/24/2014 01:12   Dg Ankle Left Port  05/24/2014   CLINICAL DATA:  Left ankle pain and deformity after fall  EXAM: PORTABLE LEFT ANKLE - 2 VIEW  COMPARISON:  None.  FINDINGS: Two portable views of the left ankle demonstrate a fracture dislocation. The dislocation is probably lateral and posterior, but the exam is quite limited. Comminuted fragments are present about the distal fibula and tibia.  IMPRESSION: Very limited study, demonstrating a fracture dislocation about the left ankle.   Electronically Signed   By: Ellery Plunk M.D.   On: 05/24/2014 01:12   Dg Humerus Right  05/25/2014   CLINICAL DATA:  Fall.  Pain.  EXAM: RIGHT HUMERUS - 2+ VIEW  COMPARISON:  08/02/2011.  FINDINGS: Prominent degenerative changes right shoulder. Severe acromioclavicular glenohumeral degenerative change. Right shoulder is high-riding consistent with rotator cuff tear. No acute bony abnormality identified.  IMPRESSION: Severe acromioclavicular glenohumeral degenerative change. High-riding right shoulder consistent with rotator cuff tear. No acute abnormality. Similar findings noted on prior exam.   Electronically Signed   By: Maisie Fus  Register   On: 05/25/2014 09:31   Dg C-arm 1-60 Min  05/27/2014   CLINICAL DATA:  ORIF left ankle fracture  EXAM: DG C-ARM 61-120 MIN; LEFT ANKLE COMPLETE - 3+ VIEW  FLUOROSCOPY TIME:  Fluoroscopy Time (in minutes and seconds): 51 seconds  Number of Acquired Images:  3  COMPARISON:  CT left lower extremity dated 05/24/2014  FINDINGS: Frontal and lateral fluoroscopic spot images during ORIF of bimalleolar fractures.  Lateral compression plate and screw fixation of the distal fibula.  Two cannulated cancellous screws through the medial malleolus.  A single distal syndesmotic screw.  IMPRESSION: Fluoroscopic spot images during ORIF of the distal tibia and fibula, as above.   Electronically Signed   By: Charline Bills M.D.   On: 05/27/2014 10:41   Dg C-arm 1-60  Min  05/24/2014   CLINICAL DATA:  Left ankle fracture/dislocation.  EXAM: DG C-ARM 61-120 MIN; LEFT ANKLE - 2 VIEW  : COMPARISON:  Left ankle radiographs obtained earlier today.  FINDINGS: 8 C arm views of the left ankle demonstrate placement of hardware for external fixation of the previously described a ankle fracture/dislocation.  IMPRESSION: Operative placement of hardware for external fixation.   Electronically Signed   By: Beckie Salts M.D.   On: 05/24/2014 07:12    CBC  Recent Labs Lab 05/26/14 0640 05/27/14 0517 05/28/14 0511 05/29/14 0325 05/30/14 1310  WBC 9.0 6.4 9.1 6.7 6.8  HGB 10.1* 10.1* 10.4* 9.4* 10.6*  HCT 29.9* 31.4* 31.4* 29.0* 31.6*  PLT 187 207 220 216 241  MCV 97.4 96.3 94.9 95.4 95.5  MCH 32.9 31.0 31.4 30.9 32.0  MCHC 33.8 32.2 33.1 32.4 33.5  RDW 14.0 13.8 13.8 14.1 14.0    Chemistries   Recent Labs Lab 05/27/14 0517 05/28/14 0511 05/29/14 0325 05/30/14 1310 05/31/14 0540  NA 138 137 137 139 139  K 3.5 3.5 3.5 3.5 3.8  CL 105 104 102 104 104  CO2 GLUCOSE 98 167* 102* 151* 87  BUN CREATININE 0.56 0.75 0.71 0.65 0.60  CALCIUM 8.0* 8.0* 8.0* 8.5* 8.5*   ------------------------------------------------------------------------------------------------------------------ estimated creatinine clearance is 54.4 mL/min (by C-G formula based on Cr of 0.6). ------------------------------------------------------------------------------------------------------------------ No results for input(s): HGBA1C in the last 72 hours. ------------------------------------------------------------------------------------------------------------------ No results for input(s): CHOL, HDL, LDLCALC, TRIG, CHOLHDL, LDLDIRECT in the last 72 hours. ------------------------------------------------------------------------------------------------------------------ No results for input(s): TSH, T4TOTAL, T3FREE, THYROIDAB in the last 72  hours.  Invalid input(s):  FREET3 ------------------------------------------------------------------------------------------------------------------ No results for input(s): VITAMINB12, FOLATE, FERRITIN, TIBC, IRON, RETICCTPCT in the last 72 hours.  Coagulation profile No results for input(s): INR, PROTIME in the last 168 hours.  No results for input(s): DDIMER in the last 72 hours.  Cardiac Enzymes No results for input(s): CKMB, TROPONINI, MYOGLOBIN in the last 168 hours.  Invalid input(s): CK ------------------------------------------------------------------------------------------------------------------ Invalid input(s): POCBNP  No results for input(s): GLUCAP in the last 72 hours.   Hartley Barefoot A M.D. [facility] 05/31/2014, 3:19 PM  Pager: [phone]  Between 7am to 7pm - call Pager - [phone]  After 7pm go to [email or web address] - password [email or web address]  Call night coverage person covering after 7pm

## 2014-05-31 NOTE — Progress Notes (Signed)
Per MD note- [facility].  CSW discussed with Dr. Sunnie Nielsenegalado. She plans to see patient on rounds and will call CSW should this plan change to today.  Lorri Frederickonna T. Jaci LazierCrowder, Kentucky[place]CSW [phone] (weekend coverage)

## 2014-06-01 DIAGNOSIS — Z950 Presence of cardiac pacemaker: Secondary | ICD-10-CM

## 2014-06-01 MED ORDER — ENSURE ENLIVE PO LIQD: 237.0000 mL | Freq: Two times a day (BID) | ORAL | Status: DC

## 2014-06-01 MED ORDER — POLYETHYLENE GLYCOL 3350 17 G PO PACK: 17.0000 g | PACK | Freq: Two times a day (BID) | ORAL | Status: DC

## 2014-06-01 MED ORDER — DIAZEPAM 2 MG PO TABS: 2.0000 mg | ORAL_TABLET | Freq: Every evening | ORAL | Status: DC | PRN

## 2014-06-01 NOTE — Progress Notes (Signed)
Two attempts to gave report to the [facility] but unsuccessful due to nobody answered when receptionist transfer me. Leave a message to the charge nurse of the facility. Pt is ready to discharge.

## 2014-06-01 NOTE — Clinical Social Work Placement (Signed)
   CLINICAL SOCIAL WORK PLACEMENT  NOTE  Date:  06/01/2014  Patient Details  Name: [name] MRN: [id number] Date of Birth: 04/22/1919  Clinical Social Work is seeking post-discharge placement for this patient at the Skilled  Nursing Facility level of care (*CSW will initial, date and re-position this form in  chart as items are completed):  Yes   Patient/family provided with [place] Clinical Social Work Department's list of facilities offering this level of care within the geographic area requested by the patient (or if unable, by the patient's family).  Yes   Patient/family informed of their freedom to choose among providers that offer the needed level of care, that participate in Medicare, Medicaid or managed care program needed by the patient, have an available bed and are willing to accept the patient.  Yes   Patient/family informed of [facility]'s ownership interest in [facility] and [facility], as well as of the fact that they are under no obligation to receive care at these facilities.  PASRR submitted to EDS on 05/28/14     PASRR number received on 05/28/14     Existing PASRR number confirmed on       FL2 transmitted to all facilities in geographic area requested by pt/family on 05/28/14     FL2 transmitted to all facilities within larger geographic area on       Patient informed that his/her managed care company has contracts with or will negotiate with certain facilities, including the following:        Yes   Patient/family informed of bed offers received.  [facility]     Physician recommends and patient chooses bed at      [facility]  Patient to be transferred to facility by [facility]     Patient family notified on 06/01/14 of transfer.  Name of family member notified:  [name]     PHYSICIAN       Additional Comment:  Ok per MD for [facility].  Daughter  and patient notified- both are pleased with d/c plan.  EMS transport arranged and nursing notified to call report to facility.  No further CSW needs identified and will sign off.  Lorri Frederickonna T. Jaci LazierCrowder, [place]CSW [phone]      _______________________________________________ Lovette Clicherowder, Zari Cly T, LCSW 06/01/2014   [phone]

## 2014-06-01 NOTE — Discharge Summary (Signed)
Physician Discharge Summary  Kindred Heying WUJ:[id number] DOB: 07-Sep-1919 DOA: 05/23/2014  PCP: [name], MD  Admit date: 05/23/2014 Discharge date: 06/01/2014  Time spent: 35 minutes  Recommendations for Outpatient Follow-up:  Follow up with Dr Roda Shutters in 1 week post surgery.  Needs cbc to follow hb post sx.   Discharge Diagnoses:    Ankle fracture   Cardiac conduction disorder   Pacemaker dual chamber programmed VDD-[employer]   Atrial fibrillation   Ankle fracture, bimalleolar, closed   Open fracture ankle, bimalleolar   Fall   Peripheral neuropathy    Discharge Condition: stable.   Diet recommendation: Heart healthy  Filed Weights   05/23/14 2312 05/24/14 0502  Weight: 86.183 kg (190 lb) 99.474 kg (219 lb 4.8 oz)    History of present illness:  [name] is a 81 y.o. female with a history of Pacemaker placement for Complete Heart Block, Atrial Fibrillation and Peripheral Neuropathy who was found on the Floor in her Independent Living Apt at the [facility] by her daughter around 8 pm after she had not been able to con tact her by telephone during the afternoon. Patient reports that She was sitting on a stool in her room and fell backward onto the floor at around 1 pm and was not abel to get up. She denies any syncope or chest pain. Her daughter called EMS, and she was brought to the ED with an open and dislocated Left Ankle Fracture and confirmed by X-rays. Orthopedics Dr Rhona Leavens was consulted and she is being taken to the OR for I+D and Reduction.   Hospital Course:  Fall / left ankle fracture:  Patient is a 81 year old female with complete heart block status post pacemaker, atrial fibrillation, neuropathy presented from independent living facility after ankle injury around no lid she fell from a bar stool. Patient was also noted to have about 10 cm transverse traumatic wound on the medial aspect of the ankle with exposed to distal tibia. Orthopedics was consulted.  Patient was seen by Dr Roda Shutters. She underwent external fixation left ankle fracture and debridement of bone associated with open fracture dislocation, left ankle on 05/24/14. Ancef and tetanus injection was given ED. Patient subsequently underwent open internal fixation of the left trimalleolar ankle fracture, debridement of the bone, removal of external fixator on 05/27/11.  Orthopedics, Dr Roda Shutters recommended DVT prophylaxis with ambulation and aspirin twice a day, continue physical therapy and nonweightbearing on the left lower extremity.  Please follow with Dr Roda Shutters in 1 week.  - Bowel regimen.   Atrial fibrillation / Pacemaker  - CHADS vasc score 4, not on anticoagulation because of risk of falls. Currently she is on subcutaneous heparin for DVT prophylaxis. - Heart rate controlled.  Acute blood loss anemia; post op expected.  Hb stable.   Leukocytosis- resolved - Likely reactive secondary to fall. - Has received preop cefazolin x once , UA negative for UTI   Essential hypertension - BP stable  Stage 2 buttock;  Local care.   DVT Prophylaxis on aspirin for DVT prophylaxis.   Procedures: External fixation of the left ankle fracture on 5/8 Debridement of the bone associated with open fracture dislocation, left ankle and complex wound repair of left medial ankle wound on 5/8  Consultations:  DR Roda Shutters  Discharge Exam: Filed Vitals:   06/01/14 0541  BP: 154/65  Pulse: 75  Temp: 98.1 F (36.7 C)  Resp: 16    General: Alert in no distress.  Cardiovascular: S 1, S 2  RRR Respiratory: CTA Extremity; left LE with clean dressing.    Discharge Instructions   Discharge Instructions    Diet - low sodium heart healthy    Complete by:  As directed      Increase activity slowly    Complete by:  As directed      Non weight bearing    Complete by:  As directed           Current Discharge Medication List    START taking these medications   Details  aspirin EC 325 MG tablet  Take 1 tablet (325 mg total) by mouth 2 (two) times daily. Qty: 84 tablet, Refills: 0    feeding supplement, ENSURE ENLIVE, (ENSURE ENLIVE) LIQD Take 237 mLs by mouth 2 (two) times daily between meals. Qty: 237 mL, Refills: 12    HYDROcodone-acetaminophen (NORCO) 7.5-325 MG per tablet Take 1-2 tablets by mouth every 6 (six) hours as needed for moderate pain. Qty: 30 tablet, Refills: 0    polyethylene glycol (MIRALAX / GLYCOLAX) packet Take 17 g by mouth 2 (two) times daily. Qty: 14 each, Refills: 0      CONTINUE these medications which have CHANGED   Details  diazepam (VALIUM) 2 MG tablet Take 1 tablet (2 mg total) by mouth at bedtime as needed for anxiety. Qty: 30 tablet, Refills: 0    DULoxetine (CYMBALTA) 60 MG capsule Take 1 capsule (60 mg total) by mouth daily. Qty: 30 capsule, Refills: 0      CONTINUE these medications which have NOT CHANGED   Details  gabapentin (NEURONTIN) 300 MG capsule Take 300 mg by mouth 2 (two) times daily.       Allergies  Allergen Reactions  . Latex Itching, Rash and Other (See Comments)    Bandaging and tape w/adhesive "sometimes get rash; sometimes pretty bad" Causes redness    Follow-up Information    Follow up with Cheral Almas, MD In 1 week.   Specialty:  Orthopedic Surgery   Why:  For wound re-check   Contact information:   [address] [place] [address] [phone]       Follow up with [name], MD. Schedule an appointment as soon as possible for a visit in 2 weeks.   Specialty:  Family Medicine   Why:  for hospital follow-up   Contact information:   [address] [place] [address] [phone]        The results of significant diagnostics from this hospitalization (including imaging, microbiology, ancillary and laboratory) are listed below for reference.    Significant Diagnostic Studies: Dg Shoulder Right  05/25/2014   CLINICAL DATA:  Acute right shoulder pain after recent  fall. Initial encounter.  EXAM: RIGHT SHOULDER - 2+ VIEW  COMPARISON:  None.  FINDINGS: No fracture or dislocation is noted. Visualized ribs appear normal. Severe narrowing of subacromial space is noted consistent with rotator cuff injury. Degenerative change of right glenohumeral and acromioclavicular joints is noted as well.  IMPRESSION: Degenerative changes of right glenohumeral and acromioclavicular joints is noted as well as severe subacromial joint space narrowing suggesting rotator cuff injury. No acute abnormality seen in the right shoulder.   Electronically Signed   By: Lupita Raider, M.D.   On: 05/25/2014 09:32   Dg Ankle 2 Views Left  05/24/2014   CLINICAL DATA:  Left ankle fracture/dislocation.  EXAM: DG C-ARM 61-120 MIN; LEFT ANKLE - 2 VIEW  : COMPARISON:  Left ankle radiographs obtained  earlier today.  FINDINGS: 8 C arm views of the left ankle demonstrate placement of hardware for external fixation of the previously described a ankle fracture/dislocation.  IMPRESSION: Operative placement of hardware for external fixation.   Electronically Signed   By: Beckie SaltsSteven  Reid M.D.   On: 05/24/2014 07:12   Dg Ankle Complete Left  05/27/2014   CLINICAL DATA:  ORIF left ankle fracture  EXAM: DG C-ARM 61-120 MIN; LEFT ANKLE COMPLETE - 3+ VIEW  FLUOROSCOPY TIME:  Fluoroscopy Time (in minutes and seconds): 51 seconds  Number of Acquired Images:  3  COMPARISON:  CT left lower extremity dated 05/24/2014  FINDINGS: Frontal and lateral fluoroscopic spot images during ORIF of bimalleolar fractures.  Lateral compression plate and screw fixation of the distal fibula.  Two cannulated cancellous screws through the medial malleolus.  A single distal syndesmotic screw.  IMPRESSION: Fluoroscopic spot images during ORIF of the distal tibia and fibula, as above.   Electronically Signed   By: Charline BillsSriyesh  Krishnan M.D.   On: 05/27/2014 10:41   Ct Head Wo Contrast  05/24/2014   CLINICAL DATA:  Initial evaluation for acute trauma,  fall.  EXAM: CT HEAD WITHOUT CONTRAST  CT CERVICAL SPINE WITHOUT CONTRAST  TECHNIQUE: Multidetector CT imaging of the head and cervical spine was performed following the standard protocol without intravenous contrast. Multiplanar CT image reconstructions of the cervical spine were also generated.  COMPARISON:  None.  FINDINGS: CT HEAD FINDINGS  Diffuse prominence of the CSF containing spaces is compatible with generalized cerebral atrophy. Patchy hypodensity within the periventricular and deep white matter both cerebral hemispheres most consistent with chronic small vessel ischemic disease. Remote left frontal infarct noted.  No acute large vessel territory infarct. No intracranial hemorrhage. No extra-axial fluid collection.  No mass lesion or midline shift.  No hydrocephalus.  Scalp soft tissues within normal limits. No acute abnormality about the orbits.  Calvarium intact.  Paranasal sinuses are clear.  No mastoid effusion.  CT CERVICAL SPINE FINDINGS  Vertebral bodies are normally aligned with preservation of the normal cervical lordosis. Vertebral body heights are maintained. No acute fracture or listhesis.  Advanced degenerative osteoarthrosis present about the C1-2 articulation as well as at the atlanto occipital articulation. Prominent bridging anterior osteophytes extend from C4 through T2. Posterior osteophytic spurring present at C3-4.  No acute soft tissue abnormality within the neck. Vascular calcifications present about the carotid bifurcations. Visualized lungs are clear.  IMPRESSION: CT BRAIN:  1. No acute intracranial process. 2. Remote left frontal lobe infarct. 3. Generalized cerebral atrophy with chronic microvascular ischemic disease.  CT CERVICAL SPINE:  1. No acute traumatic injury within the cervical spine. 2. Advanced multilevel degenerative disc disease as above.   Electronically Signed   By: Rise MuBenjamin  McClintock M.D.   On: 05/24/2014 01:47   Ct Cervical Spine Wo Contrast  05/24/2014    CLINICAL DATA:  Initial evaluation for acute trauma, fall.  EXAM: CT HEAD WITHOUT CONTRAST  CT CERVICAL SPINE WITHOUT CONTRAST  TECHNIQUE: Multidetector CT imaging of the head and cervical spine was performed following the standard protocol without intravenous contrast. Multiplanar CT image reconstructions of the cervical spine were also generated.  COMPARISON:  None.  FINDINGS: CT HEAD FINDINGS  Diffuse prominence of the CSF containing spaces is compatible with generalized cerebral atrophy. Patchy hypodensity within the periventricular and deep white matter both cerebral hemispheres most consistent with chronic small vessel ischemic disease. Remote left frontal infarct noted.  No acute large vessel territory infarct.  No intracranial hemorrhage. No extra-axial fluid collection.  No mass lesion or midline shift.  No hydrocephalus.  Scalp soft tissues within normal limits. No acute abnormality about the orbits.  Calvarium intact.  Paranasal sinuses are clear.  No mastoid effusion.  CT CERVICAL SPINE FINDINGS  Vertebral bodies are normally aligned with preservation of the normal cervical lordosis. Vertebral body heights are maintained. No acute fracture or listhesis.  Advanced degenerative osteoarthrosis present about the C1-2 articulation as well as at the atlanto occipital articulation. Prominent bridging anterior osteophytes extend from C4 through T2. Posterior osteophytic spurring present at C3-4.  No acute soft tissue abnormality within the neck. Vascular calcifications present about the carotid bifurcations. Visualized lungs are clear.  IMPRESSION: CT BRAIN:  1. No acute intracranial process. 2. Remote left frontal lobe infarct. 3. Generalized cerebral atrophy with chronic microvascular ischemic disease.  CT CERVICAL SPINE:  1. No acute traumatic injury within the cervical spine. 2. Advanced multilevel degenerative disc disease as above.   Electronically Signed   By: Rise Mu M.D.   On: 05/24/2014  01:47   Ct Ankle Left Wo Contrast  05/24/2014   CLINICAL DATA:  S/P External fixation left ankle fractureDebridement of bone associated with open fracture dislocation, left ankleComplex wound repair of left medial ankleHx of bilateral knee and RT leg surgeries Hx of arthritis per pt  EXAM: CT OF THE LEFT ANKLE WITHOUT CONTRAST  TECHNIQUE: Multidetector CT imaging of the left ankle was performed according to the standard protocol. Multiplanar CT image reconstructions were also generated.  COMPARISON:  None.  FINDINGS: There are fractures of the distal tibia and fibula. There is a transverse fracture across the base the medial malleolus. An oblique comminuted fracture crosses the distal fibula from the posterior lateral meta diaphysis to the anterior metaphysis. The fibular fracture is mildly displaced laterally, with the major fracture components separated by 6 mm. No significant displacement of the medial malleolar fracture component. There is an additional fracture along the posterior lateral margin of the distal tibia with a sliver of bone displacing laterally by 4 mm.  The ankle mortise is normally spaced and aligned.  The fractures are held in position with an external stabilizer with screws extending through the mid tibial shaft and to the body of the calcaneus.  Bones are diffusely demineralized. There is no convincing bone resorption to suggest osteomyelitis.  Soft tissue air is seen along the anterior and anterior lateral aspect of the ankle joint, along several of the extensor tendons sheaths, and along the medial aspect of the ankle. Air tracks superiorly along the lateral and medial aspects of the ankle and calf both along the ankle musculature and along the superficial muscle fascia. There is mild edema in the subcutaneous soft tissues of the ankle, but no formed fluid collection to suggest an abscess. There is also no ankle joint effusion to suggest a septic arthritis.  IMPRESSION: 1. External  fixation of fractures of the left ankle. There is an oblique comminuted fracture of the distal fibula, mildly displaced, with the major distal fracture component displacing laterally by 6 mm. There is a transverse fracture across the base of the medial malleolus, nondisplaced and a small sliver type fracture of the posterior lateral tibia, minimally displaced. 2. Ankle mortise is normally spaced and aligned. 3. There is soft tissue air in edema presumably from the recent wound debridement. No evidence of an abscess. 4. No ankle joint effusion. 5. No CT evidence of osteomyelitis.   Electronically Signed  By: Amie Portlandavid  Ormond M.D.   On: 05/24/2014 10:45   Dg Chest Port 1 View  05/24/2014   CLINICAL DATA:  Fall.  Left ankle deformity.  EXAM: PORTABLE CHEST - 1 VIEW  COMPARISON:  08/02/2011  FINDINGS: Pacemaker remains in place. Cardiac silhouette is upper limits of normal in size. The lungs are mildly hypoinflated without evidence of airspace consolidation, edema, pleural effusion, or pneumothorax. Right upper quadrant abdominal surgical clips are noted. No acute osseous abnormality is identified. Evidence chronic left rotator cuff tear.  IMPRESSION: No active disease.   Electronically Signed   By: Sebastian AcheAllen  Grady   On: 05/24/2014 01:12   Dg Ankle Left Port  05/24/2014   CLINICAL DATA:  Left ankle pain and deformity after fall  EXAM: PORTABLE LEFT ANKLE - 2 VIEW  COMPARISON:  None.  FINDINGS: Two portable views of the left ankle demonstrate a fracture dislocation. The dislocation is probably lateral and posterior, but the exam is quite limited. Comminuted fragments are present about the distal fibula and tibia.  IMPRESSION: Very limited study, demonstrating a fracture dislocation about the left ankle.   Electronically Signed   By: Ellery Plunkaniel R Mitchell M.D.   On: 05/24/2014 01:12   Dg Humerus Right  05/25/2014   CLINICAL DATA:  Fall.  Pain.  EXAM: RIGHT HUMERUS - 2+ VIEW  COMPARISON:  08/02/2011.  FINDINGS: Prominent  degenerative changes right shoulder. Severe acromioclavicular glenohumeral degenerative change. Right shoulder is high-riding consistent with rotator cuff tear. No acute bony abnormality identified.  IMPRESSION: Severe acromioclavicular glenohumeral degenerative change. High-riding right shoulder consistent with rotator cuff tear. No acute abnormality. Similar findings noted on prior exam.   Electronically Signed   By: Maisie Fushomas  Register   On: 05/25/2014 09:31   Dg C-arm 1-60 Min  05/27/2014   CLINICAL DATA:  ORIF left ankle fracture  EXAM: DG C-ARM 61-120 MIN; LEFT ANKLE COMPLETE - 3+ VIEW  FLUOROSCOPY TIME:  Fluoroscopy Time (in minutes and seconds): 51 seconds  Number of Acquired Images:  3  COMPARISON:  CT left lower extremity dated 05/24/2014  FINDINGS: Frontal and lateral fluoroscopic spot images during ORIF of bimalleolar fractures.  Lateral compression plate and screw fixation of the distal fibula.  Two cannulated cancellous screws through the medial malleolus.  A single distal syndesmotic screw.  IMPRESSION: Fluoroscopic spot images during ORIF of the distal tibia and fibula, as above.   Electronically Signed   By: Charline BillsSriyesh  Krishnan M.D.   On: 05/27/2014 10:41   Dg C-arm 1-60 Min  05/24/2014   CLINICAL DATA:  Left ankle fracture/dislocation.  EXAM: DG C-ARM 61-120 MIN; LEFT ANKLE - 2 VIEW  : COMPARISON:  Left ankle radiographs obtained earlier today.  FINDINGS: 8 C arm views of the left ankle demonstrate placement of hardware for external fixation of the previously described a ankle fracture/dislocation.  IMPRESSION: Operative placement of hardware for external fixation.   Electronically Signed   By: Beckie SaltsSteven  Reid M.D.   On: 05/24/2014 07:12    Microbiology: Recent Results (from the past 240 hour(s))  Urine culture     Status: None   Collection Time: 05/25/14  1:30 PM  Result Value Ref Range Status   Specimen Description URINE, RANDOM  Final   Special Requests NONE  Final   Colony Count NO  GR[facility]TH [facility] at [facility]   Final   Culture NO GROWTH Performed at [facility]   Final   Report Status 05/26/2014 FINAL  Final  Labs: Basic Metabolic Panel:  Recent Labs Lab 05/27/14 0517 05/28/14 0511 05/29/14 0325 05/30/14 1310 05/31/14 0540  NA 138 137 137 139 139  K 3.5 3.5 3.5 3.5 3.8  CL 105 104 102 104 104  CO2 GLUCOSE 98 167* 102* 151* 87  BUN CREATININE 0.56 0.75 0.71 0.65 0.60  CALCIUM 8.0* 8.0* 8.0* 8.5* 8.5*   Liver Function Tests: No results for input(s): AST, ALT, ALKPHOS, BILITOT, PROT, ALBUMIN in the last 168 hours. No results for input(s): LIPASE, AMYLASE in the last 168 hours. No results for input(s): AMMONIA in the last 168 hours. CBC:  Recent Labs Lab 05/26/14 0640 05/27/14 0517 05/28/14 0511 05/29/14 0325 05/30/14 1310  WBC 9.0 6.4 9.1 6.7 6.8  HGB 10.1* 10.1* 10.4* 9.4* 10.6*  HCT 29.9* 31.4* 31.4* 29.0* 31.6*  MCV 97.4 96.3 94.9 95.4 95.5  PLT 187 207 220 216 241   Cardiac Enzymes: No results for input(s): CKTOTAL, CKMB, CKMBINDEX, TROPONINI in the last 168 hours. BNP: BNP (last 3 results) No results for input(s): BNP in the last 8760 hours.  ProBNP (last 3 results) No results for input(s): PROBNP in the last 8760 hours.  CBG: No results for input(s): GLUCAP in the last 168 hours.     Signed:  Hartley Barefoot A  [facility] 06/01/2014, 8:33 AM

## 2014-06-02 ENCOUNTER — Encounter: Payer: Self-pay | Admitting: Adult Health

## 2014-06-02 ENCOUNTER — Non-Acute Institutional Stay (SKILLED_NURSING_FACILITY): Payer: Medicare Other | Admitting: Adult Health

## 2014-06-02 DIAGNOSIS — I480 Paroxysmal atrial fibrillation: Secondary | ICD-10-CM

## 2014-06-02 DIAGNOSIS — D62 Acute posthemorrhagic anemia: Secondary | ICD-10-CM | POA: Diagnosis not present

## 2014-06-02 DIAGNOSIS — G629 Polyneuropathy, unspecified: Secondary | ICD-10-CM | POA: Diagnosis not present

## 2014-06-02 DIAGNOSIS — S82842S Displaced bimalleolar fracture of left lower leg, sequela: Secondary | ICD-10-CM | POA: Diagnosis not present

## 2014-06-02 DIAGNOSIS — F419 Anxiety disorder, unspecified: Secondary | ICD-10-CM | POA: Diagnosis not present

## 2014-06-02 DIAGNOSIS — F329 Major depressive disorder, single episode, unspecified: Secondary | ICD-10-CM | POA: Diagnosis not present

## 2014-06-02 DIAGNOSIS — F32A Depression, unspecified: Secondary | ICD-10-CM

## 2014-06-02 NOTE — Progress Notes (Signed)
Patient ID: [name], female   DOB: 1919/12/25, 81 y.o.   MRN: [id number]   06/02/2014  Facility:  Nursing Home Location:  [facility] Room Number: 301-2 LEVEL OF CARE:  SNF (31)   Chief Complaint  Patient presents with  . Hospitalization Follow-up    Left ankle open fracture, Neuropathy, Depression, Anxiety, atrial fibrillation and anemia    HISTORY OF PRESENT ILLNESS:  This is a 81 year old female who has been admitted to [facility] on 06/01/14 from [facility]. She has PMH of complete heart block S/P pacemaker placement, atrial fibrillation and neuropathy. She needs in independent living facility wherein she fell backward while sitting on the chair and was unable to get up. X-ray shows left ankle dislocation and fracture. She had external fixation of left uncal fracture and debridement of bone associated with open fracture dislocation, left ankle 05/24/14. Ancef and tetanus injection was given in the ED. She then underwent open internal fixation of the left trimalleolar ankle fracture, debridement of the bone, removal of external fixation, on 05/26/12.  She has been admitted for a short-term rehabilitation.  PAST MEDICAL HISTORY:  Past Medical History  Diagnosis Date  . Complete heart block   . Visual problems     "very limited in right eye"  . Pacemaker     FOR COMPLETE HEART BLOCK....MEDTRONIC ADAPTA R01  . Peripheral neuropathy   . History of blood transfusion     "only w/operations"  . Arthritis     "all over kind of"  . Syncope and collapse 08/02/11    "first time ever"  . Dementia 08/02/11    Delene Loll; "forgetful, repeating"    CURRENT MEDICATIONS: Reviewed per MAR/see medication list  Allergies  Allergen Reactions  . Latex Itching, Rash and Other (See Comments)    Bandaging and tape w/adhesive "sometimes get rash; sometimes pretty bad" Causes redness      REVIEW OF SYSTEMS:  GENERAL: no change in appetite, no  fatigue, no weight changes, no fever, chills or weakness RESPIRATORY: no cough, SOB, DOE, wheezing, hemoptysis CARDIAC: no chest pain, edema or palpitations GI: no abdominal pain, diarrhea, constipation, heart burn, nausea or vomiting  PHYSICAL EXAMINATION  GENERAL: no acute distress, normal body habitus EYES: conjunctivae normal, sclerae normal, normal eye lids NECK: supple, trachea midline, no neck masses, no thyroid tenderness, no thyromegaly LYMPHATICS: no LAN in the neck, no supraclavicular LAN RESPIRATORY: breathing is even & unlabored, BS CTAB CARDIAC: RRR, no murmur,no extra heart sounds, no edema, left chest pacemaker GI: abdomen soft, normal BS, no masses, no tenderness, no hepatomegaly, no splenomegaly EXTREMITIES:  Has left foot splint wrapped in ACE wrap PSYCHIATRIC: the patient is alert & oriented to person, affect & behavior appropriate  LABS/RADIOLOGY:   Labs reviewed: Basic Metabolic Panel:  Recent Labs  40/98/11 0325 05/30/14 1310 05/31/14 0540  NA 137 139 139  K 3.5 3.5 3.8  CL 102 104 104  CO2 GLUCOSE 102* 151* 87  BUN CREATININE 0.71 0.65 0.60  CALCIUM 8.0* 8.5* 8.5*   CBC:  Recent Labs  05/24/14 0025  05/28/14 0511 05/29/14 0325 05/30/14 1310  WBC 13.0*  < > 9.1 6.7 6.8  NEUTROABS 11.5*  --   --   --   --   HGB 13.0  < > 10.4* 9.4* 10.6*  HCT 38.5  < > 31.4* 29.0* 31.6*  MCV 93.9  < > 94.9 95.4 95.5  PLT 224  < > 220 216 241  < > = values in this interval not displayed.  Cardiac Enzymes:  Recent Labs  05/24/14 0025  CKTOTAL 572*    Dg Shoulder Right  05/25/2014   CLINICAL DATA:  Acute right shoulder pain after recent fall. Initial encounter.  EXAM: RIGHT SHOULDER - 2+ VIEW  COMPARISON:  None.  FINDINGS: No fracture or dislocation is noted. Visualized ribs appear normal. Severe narrowing of subacromial space is noted consistent with rotator cuff injury. Degenerative change of right glenohumeral and acromioclavicular  joints is noted as well.  IMPRESSION: Degenerative changes of right glenohumeral and acromioclavicular joints is noted as well as severe subacromial joint space narrowing suggesting rotator cuff injury. No acute abnormality seen in the right shoulder.   Electronically Signed   By: Lupita Raider, M.D.   On: 05/25/2014 09:32   Dg Ankle 2 Views Left  05/24/2014   CLINICAL DATA:  Left ankle fracture/dislocation.  EXAM: DG C-ARM 61-120 MIN; LEFT ANKLE - 2 VIEW  : COMPARISON:  Left ankle radiographs obtained earlier today.  FINDINGS: 8 C arm views of the left ankle demonstrate placement of hardware for external fixation of the previously described a ankle fracture/dislocation.  IMPRESSION: Operative placement of hardware for external fixation.   Electronically Signed   By: Beckie Salts M.D.   On: 05/24/2014 07:12   Dg Ankle Complete Left  05/27/2014   CLINICAL DATA:  ORIF left ankle fracture  EXAM: DG C-ARM 61-120 MIN; LEFT ANKLE COMPLETE - 3+ VIEW  FLUOROSCOPY TIME:  Fluoroscopy Time (in minutes and seconds): 51 seconds  Number of Acquired Images:  3  COMPARISON:  CT left lower extremity dated 05/24/2014  FINDINGS: Frontal and lateral fluoroscopic spot images during ORIF of bimalleolar fractures.  Lateral compression plate and screw fixation of the distal fibula.  Two cannulated cancellous screws through the medial malleolus.  A single distal syndesmotic screw.  IMPRESSION: Fluoroscopic spot images during ORIF of the distal tibia and fibula, as above.   Electronically Signed   By: Charline Bills M.D.   On: 05/27/2014 10:41   Ct Head Wo Contrast  05/24/2014   CLINICAL DATA:  Initial evaluation for acute trauma, fall.  EXAM: CT HEAD WITHOUT CONTRAST  CT CERVICAL SPINE WITHOUT CONTRAST  TECHNIQUE: Multidetector CT imaging of the head and cervical spine was performed following the standard protocol without intravenous contrast. Multiplanar CT image reconstructions of the cervical spine were also generated.   COMPARISON:  None.  FINDINGS: CT HEAD FINDINGS  Diffuse prominence of the CSF containing spaces is compatible with generalized cerebral atrophy. Patchy hypodensity within the periventricular and deep white matter both cerebral hemispheres most consistent with chronic small vessel ischemic disease. Remote left frontal infarct noted.  No acute large vessel territory infarct. No intracranial hemorrhage. No extra-axial fluid collection.  No mass lesion or midline shift.  No hydrocephalus.  Scalp soft tissues within normal limits. No acute abnormality about the orbits.  Calvarium intact.  Paranasal sinuses are clear.  No mastoid effusion.  CT CERVICAL SPINE FINDINGS  Vertebral bodies are normally aligned with preservation of the normal cervical lordosis. Vertebral body heights are maintained. No acute fracture or listhesis.  Advanced degenerative osteoarthrosis present about the C1-2 articulation as well as at the atlanto occipital articulation. Prominent bridging anterior osteophytes extend from C4 through T2. Posterior osteophytic spurring present at C3-4.  No acute soft tissue abnormality within the neck. Vascular calcifications present about the carotid bifurcations. Visualized  lungs are clear.  IMPRESSION: CT BRAIN:  1. No acute intracranial process. 2. Remote left frontal lobe infarct. 3. Generalized cerebral atrophy with chronic microvascular ischemic disease.  CT CERVICAL SPINE:  1. No acute traumatic injury within the cervical spine. 2. Advanced multilevel degenerative disc disease as above.   Electronically Signed   By: Rise Mu M.D.   On: 05/24/2014 01:47   Ct Cervical Spine Wo Contrast  05/24/2014   CLINICAL DATA:  Initial evaluation for acute trauma, fall.  EXAM: CT HEAD WITHOUT CONTRAST  CT CERVICAL SPINE WITHOUT CONTRAST  TECHNIQUE: Multidetector CT imaging of the head and cervical spine was performed following the standard protocol without intravenous contrast. Multiplanar CT image  reconstructions of the cervical spine were also generated.  COMPARISON:  None.  FINDINGS: CT HEAD FINDINGS  Diffuse prominence of the CSF containing spaces is compatible with generalized cerebral atrophy. Patchy hypodensity within the periventricular and deep white matter both cerebral hemispheres most consistent with chronic small vessel ischemic disease. Remote left frontal infarct noted.  No acute large vessel territory infarct. No intracranial hemorrhage. No extra-axial fluid collection.  No mass lesion or midline shift.  No hydrocephalus.  Scalp soft tissues within normal limits. No acute abnormality about the orbits.  Calvarium intact.  Paranasal sinuses are clear.  No mastoid effusion.  CT CERVICAL SPINE FINDINGS  Vertebral bodies are normally aligned with preservation of the normal cervical lordosis. Vertebral body heights are maintained. No acute fracture or listhesis.  Advanced degenerative osteoarthrosis present about the C1-2 articulation as well as at the atlanto occipital articulation. Prominent bridging anterior osteophytes extend from C4 through T2. Posterior osteophytic spurring present at C3-4.  No acute soft tissue abnormality within the neck. Vascular calcifications present about the carotid bifurcations. Visualized lungs are clear.  IMPRESSION: CT BRAIN:  1. No acute intracranial process. 2. Remote left frontal lobe infarct. 3. Generalized cerebral atrophy with chronic microvascular ischemic disease.  CT CERVICAL SPINE:  1. No acute traumatic injury within the cervical spine. 2. Advanced multilevel degenerative disc disease as above.   Electronically Signed   By: Rise Mu M.D.   On: 05/24/2014 01:47   Ct Ankle Left Wo Contrast  05/24/2014   CLINICAL DATA:  S/P External fixation left ankle fractureDebridement of bone associated with open fracture dislocation, left ankleComplex wound repair of left medial ankleHx of bilateral knee and RT leg surgeries Hx of arthritis per pt  EXAM: CT  OF THE LEFT ANKLE WITHOUT CONTRAST  TECHNIQUE: Multidetector CT imaging of the left ankle was performed according to the standard protocol. Multiplanar CT image reconstructions were also generated.  COMPARISON:  None.  FINDINGS: There are fractures of the distal tibia and fibula. There is a transverse fracture across the base the medial malleolus. An oblique comminuted fracture crosses the distal fibula from the posterior lateral meta diaphysis to the anterior metaphysis. The fibular fracture is mildly displaced laterally, with the major fracture components separated by 6 mm. No significant displacement of the medial malleolar fracture component. There is an additional fracture along the posterior lateral margin of the distal tibia with a sliver of bone displacing laterally by 4 mm.  The ankle mortise is normally spaced and aligned.  The fractures are held in position with an external stabilizer with screws extending through the mid tibial shaft and to the body of the calcaneus.  Bones are diffusely demineralized. There is no convincing bone resorption to suggest osteomyelitis.  Soft tissue air is seen along the  anterior and anterior lateral aspect of the ankle joint, along several of the extensor tendons sheaths, and along the medial aspect of the ankle. Air tracks superiorly along the lateral and medial aspects of the ankle and calf both along the ankle musculature and along the superficial muscle fascia. There is mild edema in the subcutaneous soft tissues of the ankle, but no formed fluid collection to suggest an abscess. There is also no ankle joint effusion to suggest a septic arthritis.  IMPRESSION: 1. External fixation of fractures of the left ankle. There is an oblique comminuted fracture of the distal fibula, mildly displaced, with the major distal fracture component displacing laterally by 6 mm. There is a transverse fracture across the base of the medial malleolus, nondisplaced and a small sliver type  fracture of the posterior lateral tibia, minimally displaced. 2. Ankle mortise is normally spaced and aligned. 3. There is soft tissue air in edema presumably from the recent wound debridement. No evidence of an abscess. 4. No ankle joint effusion. 5. No CT evidence of osteomyelitis.   Electronically Signed   By: Amie Portlandavid  Ormond M.D.   On: 05/24/2014 10:45   Dg Chest Port 1 View  05/24/2014   CLINICAL DATA:  Fall.  Left ankle deformity.  EXAM: PORTABLE CHEST - 1 VIEW  COMPARISON:  08/02/2011  FINDINGS: Pacemaker remains in place. Cardiac silhouette is upper limits of normal in size. The lungs are mildly hypoinflated without evidence of airspace consolidation, edema, pleural effusion, or pneumothorax. Right upper quadrant abdominal surgical clips are noted. No acute osseous abnormality is identified. Evidence chronic left rotator cuff tear.  IMPRESSION: No active disease.   Electronically Signed   By: Sebastian AcheAllen  Grady   On: 05/24/2014 01:12   Dg Ankle Left Port  05/24/2014   CLINICAL DATA:  Left ankle pain and deformity after fall  EXAM: PORTABLE LEFT ANKLE - 2 VIEW  COMPARISON:  None.  FINDINGS: Two portable views of the left ankle demonstrate a fracture dislocation. The dislocation is probably lateral and posterior, but the exam is quite limited. Comminuted fragments are present about the distal fibula and tibia.  IMPRESSION: Very limited study, demonstrating a fracture dislocation about the left ankle.   Electronically Signed   By: Ellery Plunkaniel R Mitchell M.D.   On: 05/24/2014 01:12   Dg Humerus Right  05/25/2014   CLINICAL DATA:  Fall.  Pain.  EXAM: RIGHT HUMERUS - 2+ VIEW  COMPARISON:  08/02/2011.  FINDINGS: Prominent degenerative changes right shoulder. Severe acromioclavicular glenohumeral degenerative change. Right shoulder is high-riding consistent with rotator cuff tear. No acute bony abnormality identified.  IMPRESSION: Severe acromioclavicular glenohumeral degenerative change. High-riding right shoulder  consistent with rotator cuff tear. No acute abnormality. Similar findings noted on prior exam.   Electronically Signed   By: Maisie Fushomas  Register   On: 05/25/2014 09:31   Dg C-arm 1-60 Min  05/27/2014   CLINICAL DATA:  ORIF left ankle fracture  EXAM: DG C-ARM 61-120 MIN; LEFT ANKLE COMPLETE - 3+ VIEW  FLUOROSCOPY TIME:  Fluoroscopy Time (in minutes and seconds): 51 seconds  Number of Acquired Images:  3  COMPARISON:  CT left lower extremity dated 05/24/2014  FINDINGS: Frontal and lateral fluoroscopic spot images during ORIF of bimalleolar fractures.  Lateral compression plate and screw fixation of the distal fibula.  Two cannulated cancellous screws through the medial malleolus.  A single distal syndesmotic screw.  IMPRESSION: Fluoroscopic spot images during ORIF of the distal tibia and fibula, as above.   Electronically  Signed   By: Charline BillsSriyesh  Krishnan M.D.   On: 05/27/2014 10:41   Dg C-arm 1-60 Min  05/24/2014   CLINICAL DATA:  Left ankle fracture/dislocation.  EXAM: DG C-ARM 61-120 MIN; LEFT ANKLE - 2 VIEW  : COMPARISON:  Left ankle radiographs obtained earlier today.  FINDINGS: 8 C arm views of the left ankle demonstrate placement of hardware for external fixation of the previously described a ankle fracture/dislocation.  IMPRESSION: Operative placement of hardware for external fixation.   Electronically Signed   By: Beckie SaltsSteven  Reid M.D.   On: 05/24/2014 07:12    ASSESSMENT/PLAN:  Left ankle fracture S/P external fixation of the left ankle fracture, debridement of the bone associated with open fracture dislocation -  Follow-up with Dr. Roda ShuttersXu, orthopedic surgeon, in 1 week; continue aspirin 325 mg by mouth twice a day for DVT prophylaxis and Norco 7.5/325 mg 1-2 tabs by mouth every 6 hours when necessary for pain Depression - mood is stable; continue Cymbalta 60 mg by mouth daily Neuropathy - continue gabapentin 300 mg by mouth twice a day Anxiety - continue Valium 2 mg by mouth daily at bedtime when  necessary Atrial fibrillation - has pacemaker ; rate controlled; not on anticoagulation due to fall risk Anemia, acute blood loss - hemoglobin 10.6; will monitor   Goals of care:  Short-term rehabilitation   Labs/test ordered:   CBC, BMP  Spent 50 minutes in patient care.     [facility] HospitalMEDINA-VARGAS,Loyalty Brashier, NP [facility] [phone]

## 2014-06-04 ENCOUNTER — Non-Acute Institutional Stay (SKILLED_NURSING_FACILITY): Payer: Medicare Other | Admitting: Internal Medicine

## 2014-06-04 DIAGNOSIS — F32A Depression, unspecified: Secondary | ICD-10-CM

## 2014-06-04 DIAGNOSIS — F329 Major depressive disorder, single episode, unspecified: Secondary | ICD-10-CM

## 2014-06-04 DIAGNOSIS — D62 Acute posthemorrhagic anemia: Secondary | ICD-10-CM | POA: Diagnosis not present

## 2014-06-04 DIAGNOSIS — L89152 Pressure ulcer of sacral region, stage 2: Secondary | ICD-10-CM

## 2014-06-04 DIAGNOSIS — S82842S Displaced bimalleolar fracture of left lower leg, sequela: Secondary | ICD-10-CM | POA: Diagnosis not present

## 2014-06-04 DIAGNOSIS — G629 Polyneuropathy, unspecified: Secondary | ICD-10-CM | POA: Diagnosis not present

## 2014-06-04 DIAGNOSIS — I480 Paroxysmal atrial fibrillation: Secondary | ICD-10-CM
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
## 2014-06-25 ENCOUNTER — Other Ambulatory Visit: Payer: Self-pay

## 2014-06-25 MED ORDER — DIAZEPAM 2 MG PO TABS: ORAL_TABLET | ORAL | Status: DC

## 2014-06-25 NOTE — Telephone Encounter (Signed)
Rx faxed to [facility] @ [phone], phone number [phone]  

## 2014-07-24 ENCOUNTER — Encounter: Payer: Self-pay | Admitting: Adult Health

## 2014-07-24 ENCOUNTER — Non-Acute Institutional Stay (SKILLED_NURSING_FACILITY): Payer: Medicare Other | Admitting: Adult Health

## 2014-07-24 DIAGNOSIS — F419 Anxiety disorder, unspecified: Secondary | ICD-10-CM

## 2014-07-24 DIAGNOSIS — D62 Acute posthemorrhagic anemia: Secondary | ICD-10-CM | POA: Diagnosis not present

## 2014-07-24 DIAGNOSIS — G629 Polyneuropathy, unspecified: Secondary | ICD-10-CM | POA: Diagnosis not present

## 2014-07-24 DIAGNOSIS — S82842S Displaced bimalleolar fracture of left lower leg, sequela: Secondary | ICD-10-CM

## 2014-07-24 DIAGNOSIS — G4700 Insomnia, unspecified: Secondary | ICD-10-CM | POA: Diagnosis not present

## 2014-07-24 DIAGNOSIS — F329 Major depressive disorder, single episode, unspecified: Secondary | ICD-10-CM | POA: Diagnosis not present

## 2014-07-24 DIAGNOSIS — R339 Retention of urine, unspecified: Secondary | ICD-10-CM | POA: Diagnosis not present

## 2014-07-24 DIAGNOSIS — F32A Depression, unspecified: Secondary | ICD-10-CM

## 2014-07-24 DIAGNOSIS — K5901 Slow transit constipation: Secondary | ICD-10-CM

## 2014-07-24 DIAGNOSIS — I480 Paroxysmal atrial fibrillation: Secondary | ICD-10-CM | POA: Diagnosis not present

## 2014-07-26 NOTE — Progress Notes (Signed)
Patient ID: [name], female   DOB: 04/01/1919, 81 y.o.   MRN: [id number]  07/24/14  Facility:  Nursing Home Location:  [facility] Room Number: 301-2 LEVEL OF CARE:  SNF (31)   Chief Complaint  Patient presents with  . Medical Management of Chronic Issues    Left ankle open fracture, Neuropathy, constipation, Depression, Anxiety, atrial fibrillation, insomnia, urinary retention and anemia    HISTORY OF PRESENT ILLNESS:  This is a 81 year old female who Is being seen for a routine visit. [facility]. She has PMH of complete heart block S/P pacemaker placement, atrial fibrillation and neuropathy. She needs in independent living facility wherein she fell backward while sitting on the chair and was unable to get up. X-ray shows left ankle dislocation and fracture. She had external fixation of left uncal fracture and debridement of bone associated with open fracture dislocation, left ankle 05/24/14. Ancef and tetanus injection was given in the ED. She then underwent open internal fixation of the left trimalleolar ankle fracture, debridement of the bone, removal of external fixation, on 05/26/12.  A trial removal of foley catheter was attempted but she was not able to void. She follows up with urology. She is now partial weight bearing on he LLE and wears a boot. Trazodone was recently added for insomnia.  PAST MEDICAL HISTORY:  Past Medical History  Diagnosis Date  . Complete heart block   . Visual problems     "very limited in right eye"  . Pacemaker     FOR COMPLETE HEART BLOCK....MEDTRONIC ADAPTA R01  . Peripheral neuropathy   . History of blood transfusion     "only w/operations"  . Arthritis     "all over kind of"  . Syncope and collapse 08/02/11    "first time ever"  . Dementia 08/02/11    Delene Loll/daughter, Lisa; "forgetful, repeating"    CURRENT MEDICATIONS: Reviewed per MAR/see medication  list  Allergies  Allergen Reactions  . Latex Itching, Rash and Other (See Comments)    Bandaging and tape w/adhesive "sometimes get rash; sometimes pretty bad" Causes redness      REVIEW OF SYSTEMS:  GENERAL: no change in appetite, no fatigue, no weight changes, no fever, chills or weakness RESPIRATORY: no cough, SOB, DOE, wheezing, hemoptysis CARDIAC: no chest pain, edema or palpitations GI: no abdominal pain, diarrhea, constipation, heart burn, nausea or vomiting  PHYSICAL EXAMINATION  GENERAL: no acute distress, normal body habitus NECK: supple, trachea midline, no neck masses, no thyroid tenderness, no thyromegaly LYMPHATICS: no LAN in the neck, no supraclavicular LAN RESPIRATORY: breathing is even & unlabored, BS CTAB CARDIAC: RRR, no murmur,no extra heart sounds, no edema, left chest pacemaker GI: abdomen soft, normal BS, no masses, no tenderness, no hepatomegaly, no splenomegaly EXTREMITIES:  Has left foot cam boot ; able to move BLE but weak PSYCHIATRIC: the patient is alert & oriented to person, affect & behavior appropriate  LABS/RADIOLOGY: Labs reviewed: 07/01/14  WBC 7.1 hemoglobin 11.0 hematocrit 33.6 MCV 88.7 platelet 335 sodium 137 potassium 3.8 glucose 90 BUN 18 creatinine 0.60 total bilirubin 0.4 alkaline phosphatase 62 SGOT 18 SGPT 11 total protein 6.3 albumin 8.7 06/30/14  KUB shows no acute intra-abdominal pathology; findings would support the clinical diagnosis of constipation. Basic Metabolic Panel:  Recent Labs  40/98/1105/13/16 0325 05/30/14 1310 05/31/14 0540  NA 137 139 139  K 3.5 3.5 3.8  CL 102 104 104  CO2 GLUCOSE 102* 151* 87  BUN CREATININE 0.71 0.65 0.60  CALCIUM 8.0* 8.5* 8.5*   CBC:  Recent Labs  05/24/14 0025  05/28/14 0511 05/29/14 0325 05/30/14 1310  WBC 13.0*  < > 9.1 6.7 6.8  NEUTROABS 11.5*  --   --   --   --   HGB 13.0  < > 10.4* 9.4* 10.6*  HCT 38.5  < > 31.4* 29.0* 31.6*  MCV 93.9  < > 94.9 95.4 95.5   PLT 224  < > 220 216 241  < > = values in this interval not displayed.  Cardiac Enzymes:  Recent Labs  05/24/14 0025  CKTOTAL 572*    ASSESSMENT/PLAN:  Left ankle fracture S/P external fixation of the left ankle fracture, debridement of the bone associated with open fracture dislocation -  Follow-up with Dr. Roda Shutters, orthopedic surgeon continue aspirin 325 mg by mouth twice a day for DVT prophylaxis and Tramadol 50 mg 1 tab PO TID when necessary for pain  Depression - mood is stable; continue Cymbalta 60 mg by mouth daily  Neuropathy - continue gabapentin 300 mg by mouth twice a day  Anxiety - Valium was recently discontinued and started on Ativan 0.5 mg 1/2 tab = 0.25 mg PO Q AM and 0.5mg  1 tab @ 6 PM and Ativan 0.5 mg 1 tab PO BID PRN  Atrial fibrillation - has pacemaker ; rate controlled; not on anticoagulation due to fall risk  Anemia, acute blood loss - hemoglobin 11.0 ; stable  Constipation - recently started on Senna S 8.6-50 mg 2 tabs PO BID and Miralax 17 gm PO Q D  Urinary retention - continue FC and follow-up with urology  Insomnia - recently started on Trazodone 50 mg Q HS    Goals of care:  Short-term rehabilitation     [facility], NP [facility] [phone]

## 2014-08-18 ENCOUNTER — Encounter: Payer: Self-pay | Admitting: Adult Health

## 2014-08-18 ENCOUNTER — Non-Acute Institutional Stay (SKILLED_NURSING_FACILITY): Payer: Medicare Other | Admitting: Adult Health

## 2014-08-18 DIAGNOSIS — F419 Anxiety disorder, unspecified: Secondary | ICD-10-CM | POA: Diagnosis not present

## 2014-08-18 DIAGNOSIS — D62 Acute posthemorrhagic anemia: Secondary | ICD-10-CM

## 2014-08-18 DIAGNOSIS — R339 Retention of urine, unspecified: Secondary | ICD-10-CM

## 2014-08-18 DIAGNOSIS — S82842S Displaced bimalleolar fracture of left lower leg, sequela: Secondary | ICD-10-CM | POA: Diagnosis not present

## 2014-08-18 DIAGNOSIS — I480 Paroxysmal atrial fibrillation: Secondary | ICD-10-CM

## 2014-08-18 DIAGNOSIS — F32A Depression, unspecified: Secondary | ICD-10-CM

## 2014-08-18 DIAGNOSIS — K5901 Slow transit constipation: Secondary | ICD-10-CM | POA: Diagnosis not present

## 2014-08-18 DIAGNOSIS — G4700 Insomnia, unspecified: Secondary | ICD-10-CM

## 2014-08-18 DIAGNOSIS — G629 Polyneuropathy, unspecified: Secondary | ICD-10-CM

## 2014-08-18 DIAGNOSIS — F329 Major depressive disorder, single episode, unspecified: Secondary | ICD-10-CM

## 2014-11-08 NOTE — Progress Notes (Signed)
Patient ID: [name], female   DOB: 1919-10-25, 81 y.o.   MRN: [id number]  08/18/14  Facility:  Nursing Home Location:  [facility] Room Number: [id number] LEVEL OF CARE:  SNF (31)   Chief Complaint  Patient presents with  . Discharge Note    Left ankle open fracture, Neuropathy, constipation, Depression, Anxiety, atrial fibrillation, insomnia, urinary retention and anemia    HISTORY OF PRESENT ILLNESS:  This is a 81 year old female who Is for discharge home with home health PT, OT and nursing. DME:  Semi-electric hospital bed, drop arm bedside commode, transfer board, standard wheelchair with leg rests, cushion and anti-tippers. She has been admitted to [facility] on 06/01/14 from [facility]. She has PMH of complete heart block S/P pacemaker placement, atrial fibrillation and neuropathy. She needs in independent living facility wherein she fell backward while sitting on the chair and was unable to get up. X-ray shows left ankle dislocation and fracture. She had external fixation of left uncal fracture and debridement of bone associated with open fracture dislocation, left ankle 05/24/14. Ancef and tetanus injection was given in the ED. She then underwent open internal fixation of the left trimalleolar ankle fracture, debridement of the bone, removal of external fixation, on 05/26/12.  Patient was admitted to this facility for short-term rehabilitation after the patient's recent hospitalization.  Patient has completed SNF rehabilitation and therapy has cleared the patient for discharge.   PAST MEDICAL HISTORY:  Past Medical History  Diagnosis Date  . Complete heart block   . Visual problems     "very limited in right eye"  . Pacemaker     FOR COMPLETE HEART BLOCK....MEDTRONIC ADAPTA R01  . Peripheral neuropathy   . History of blood transfusion     "only w/operations"  . Arthritis     "all over kind of"  . Syncope and collapse 08/02/11    "first time  ever"  . Dementia 08/02/11    Delene Loll; "forgetful, repeating"    CURRENT MEDICATIONS: Reviewed per MAR/see medication list  Allergies  Allergen Reactions  . Latex Itching, Rash and Other (See Comments)    Bandaging and tape w/adhesive "sometimes get rash; sometimes pretty bad" Causes redness      REVIEW OF SYSTEMS:  GENERAL: no change in appetite, no fatigue, no weight changes, no fever, chills or weakness RESPIRATORY: no cough, SOB, DOE, wheezing, hemoptysis CARDIAC: no chest pain, edema or palpitations GI: no abdominal pain, diarrhea, constipation, heart burn, nausea or vomiting  PHYSICAL EXAMINATION  GENERAL: no acute distress, normal body habitus NECK: supple, trachea midline, no neck masses, no thyroid tenderness, no thyromegaly LYMPHATICS: no LAN in the neck, no supraclavicular LAN RESPIRATORY: breathing is even & unlabored, BS CTAB CARDIAC: RRR, no murmur,no extra heart sounds, no edema, left chest pacemaker GI: abdomen soft, normal BS, no masses, no tenderness, no hepatomegaly, no splenomegaly EXTREMITIES:  Has left foot cam boot ; able to move BLE but weak PSYCHIATRIC: the patient is alert & oriented to person, affect & behavior appropriate  LABS/RADIOLOGY: Labs reviewed: 07/01/14  WBC 7.1 hemoglobin 11.0 hematocrit 33.6 MCV 88.7 platelet 335 sodium 137 potassium 3.8 glucose 90 BUN 18 creatinine 0.60 total bilirubin 0.4 alkaline phosphatase 62 SGOT 18 SGPT 11 total protein 6.3 albumin 8.7 06/30/14  KUB shows no acute intra-abdominal pathology; findings would support the clinical diagnosis of constipation. Basic Metabolic Panel:  Recent Labs  40/98/11 0325 05/30/14 1310 05/31/14 0540  NA 137 139 139  K 3.5 3.5 3.8  CL 102 104 104  CO2 26 27 26   GLUCOSE 102* 151* 87  BUN 12 10 16   CREATININE 0.71 0.65 0.60  CALCIUM 8.0* 8.5* 8.5*   CBC:  Recent Labs  05/24/14 0025  05/28/14 0511 05/29/14 0325 05/30/14 1310  WBC 13.0*  < > 9.1 6.7 6.8   NEUTROABS 11.5*  --   --   --   --   HGB 13.0  < > 10.4* 9.4* 10.6*  HCT 38.5  < > 31.4* 29.0* 31.6*  MCV 93.9  < > 94.9 95.4 95.5  PLT 224  < > 220 216 241  < > = values in this interval not displayed.  Cardiac Enzymes:  Recent Labs  05/24/14 0025  CKTOTAL 572*    ASSESSMENT/PLAN:  Left ankle fracture S/P external fixation of the left ankle fracture, debridement of the bone associated with open fracture dislocation -  Follow-up with Dr. Roda ShuttersXu, orthopedic surgeon continue aspirin 325 mg by mouth twice a day for DVT prophylaxis and Tramadol 50 mg 1 tab PO TID when necessary for pain  Depression - mood is stable; continue Cymbalta 60 mg by mouth daily  Neuropathy - continue gabapentin 300 mg by mouth twice a day  Anxiety - Valium was recently discontinued and started on Ativan 0.5 mg 1/2 tab = 0.25 mg PO Q AM and 0.5mg  1 tab @ 6 PM and Ativan 0.5 mg 1 tab PO BID PRN  Atrial fibrillation - has pacemaker ; rate controlled; not on anticoagulation due to fall risk  Anemia, acute blood loss - hemoglobin 11.0 ; stable  Constipation - recently started on Senna S 8.6-50 mg 2 tabs PO BID and Miralax 17 gm PO Q D  Urinary retention - continue FC and follow-up with urology  Insomnia - recently started on Trazodone 50 mg Q HS     I have filled out patient's discharge paperwork and written prescriptions.  Patient will receive home health PT, OT and Nursing.  DME provided:  Semi-electric hospital bed, drop arm bedside commode, transfer board, standard wheelchair with leg rests, cushion and anti-tippers  Total discharge time: Greater than 30 minutes  Discharge time involved coordination of the discharge process with social worker, nursing staff and therapy department. Medical justification for home health services/DME verified.    [facility],Aylan Bayona, NP [facility] [phone]

## 2015-07-01 ENCOUNTER — Encounter (HOSPITAL_BASED_OUTPATIENT_CLINIC_OR_DEPARTMENT_OTHER): Payer: Medicare Other

## 2015-07-13 ENCOUNTER — Encounter (HOSPITAL_BASED_OUTPATIENT_CLINIC_OR_DEPARTMENT_OTHER): Payer: Medicare Other | Attending: Surgery

## 2015-07-13 DIAGNOSIS — I2510 Atherosclerotic heart disease of native coronary artery without angina pectoris: Secondary | ICD-10-CM | POA: Diagnosis not present

## 2015-07-13 DIAGNOSIS — I890 Lymphedema, not elsewhere classified: Secondary | ICD-10-CM | POA: Insufficient documentation

## 2015-07-13 DIAGNOSIS — Z7982 Long term (current) use of aspirin: Secondary | ICD-10-CM | POA: Insufficient documentation

## 2015-07-13 DIAGNOSIS — G629 Polyneuropathy, unspecified: Secondary | ICD-10-CM | POA: Diagnosis not present

## 2015-07-13 DIAGNOSIS — F0390 Unspecified dementia without behavioral disturbance: Secondary | ICD-10-CM | POA: Diagnosis not present

## 2015-07-13 DIAGNOSIS — I739 Peripheral vascular disease, unspecified: Secondary | ICD-10-CM | POA: Insufficient documentation

## 2015-07-13 DIAGNOSIS — Z96653 Presence of artificial knee joint, bilateral: Secondary | ICD-10-CM | POA: Diagnosis not present

## 2015-07-13 DIAGNOSIS — Z950 Presence of cardiac pacemaker: Secondary | ICD-10-CM | POA: Insufficient documentation

## 2015-07-13 DIAGNOSIS — L97312 Non-pressure chronic ulcer of right ankle with fat layer exposed: Secondary | ICD-10-CM | POA: Insufficient documentation

## 2015-07-13 DIAGNOSIS — Z79899 Other long term (current) drug therapy: Secondary | ICD-10-CM | POA: Insufficient documentation

## 2015-07-27 ENCOUNTER — Encounter (HOSPITAL_BASED_OUTPATIENT_CLINIC_OR_DEPARTMENT_OTHER): Payer: Medicare Other | Attending: Surgery

## 2015-07-27 DIAGNOSIS — M1990 Unspecified osteoarthritis, unspecified site: Secondary | ICD-10-CM | POA: Diagnosis not present

## 2015-07-27 DIAGNOSIS — F0390 Unspecified dementia without behavioral disturbance: Secondary | ICD-10-CM | POA: Insufficient documentation

## 2015-07-27 DIAGNOSIS — L97511 Non-pressure chronic ulcer of other part of right foot limited to breakdown of skin: Secondary | ICD-10-CM | POA: Diagnosis not present

## 2015-08-03 DIAGNOSIS — L97511 Non-pressure chronic ulcer of other part of right foot limited to breakdown of skin: Secondary | ICD-10-CM | POA: Diagnosis not present

## 2015-08-10 DIAGNOSIS — L97511 Non-pressure chronic ulcer of other part of right foot limited to breakdown of skin: Secondary | ICD-10-CM | POA: Diagnosis not present

## 2015-08-24 ENCOUNTER — Encounter (HOSPITAL_BASED_OUTPATIENT_CLINIC_OR_DEPARTMENT_OTHER): Payer: Medicare Other | Attending: Surgery

## 2015-08-24 DIAGNOSIS — L97511 Non-pressure chronic ulcer of other part of right foot limited to breakdown of skin: Secondary | ICD-10-CM | POA: Diagnosis present

## 2015-08-24 DIAGNOSIS — G629 Polyneuropathy, unspecified: Secondary | ICD-10-CM | POA: Diagnosis not present

## 2015-08-24 DIAGNOSIS — I2510 Atherosclerotic heart disease of native coronary artery without angina pectoris: Secondary | ICD-10-CM | POA: Diagnosis not present

## 2015-08-24 DIAGNOSIS — Z950 Presence of cardiac pacemaker: Secondary | ICD-10-CM | POA: Diagnosis not present

## 2015-08-24 DIAGNOSIS — I890 Lymphedema, not elsewhere classified: Secondary | ICD-10-CM | POA: Insufficient documentation

## 2015-08-24 DIAGNOSIS — F0390 Unspecified dementia without behavioral disturbance: Secondary | ICD-10-CM | POA: Insufficient documentation

## 2015-08-24 DIAGNOSIS — Z96653 Presence of artificial knee joint, bilateral: Secondary | ICD-10-CM | POA: Diagnosis not present

## 2015-08-31 DIAGNOSIS — L97511 Non-pressure chronic ulcer of other part of right foot limited to breakdown of skin: Secondary | ICD-10-CM | POA: Diagnosis not present

## 2015-09-07 DIAGNOSIS — L97511 Non-pressure chronic ulcer of other part of right foot limited to breakdown of skin: Secondary | ICD-10-CM | POA: Diagnosis not present

## 2015-09-14 DIAGNOSIS — L97511 Non-pressure chronic ulcer of other part of right foot limited to breakdown of skin: Secondary | ICD-10-CM | POA: Diagnosis not present

## 2015-09-21 ENCOUNTER — Encounter (HOSPITAL_BASED_OUTPATIENT_CLINIC_OR_DEPARTMENT_OTHER): Payer: Medicare Other | Attending: Surgery

## 2015-09-21 DIAGNOSIS — F0390 Unspecified dementia without behavioral disturbance: Secondary | ICD-10-CM | POA: Diagnosis not present

## 2015-09-21 DIAGNOSIS — L97511 Non-pressure chronic ulcer of other part of right foot limited to breakdown of skin: Secondary | ICD-10-CM | POA: Insufficient documentation

## 2015-09-21 DIAGNOSIS — Z950 Presence of cardiac pacemaker: Secondary | ICD-10-CM | POA: Diagnosis not present

## 2015-09-21 DIAGNOSIS — M1990 Unspecified osteoarthritis, unspecified site: Secondary | ICD-10-CM | POA: Diagnosis not present

## 2015-09-21 DIAGNOSIS — I2510 Atherosclerotic heart disease of native coronary artery without angina pectoris: Secondary | ICD-10-CM | POA: Insufficient documentation

## 2015-10-12 DIAGNOSIS — L97511 Non-pressure chronic ulcer of other part of right foot limited to breakdown of skin: Secondary | ICD-10-CM | POA: Diagnosis not present

## 2015-11-09 ENCOUNTER — Encounter (HOSPITAL_BASED_OUTPATIENT_CLINIC_OR_DEPARTMENT_OTHER): Payer: Medicare Other | Attending: Surgery

## 2015-11-09 DIAGNOSIS — M1990 Unspecified osteoarthritis, unspecified site: Secondary | ICD-10-CM | POA: Diagnosis not present

## 2015-11-09 DIAGNOSIS — L97511 Non-pressure chronic ulcer of other part of right foot limited to breakdown of skin: Secondary | ICD-10-CM | POA: Diagnosis not present

## 2015-11-09 DIAGNOSIS — F0390 Unspecified dementia without behavioral disturbance: Secondary | ICD-10-CM | POA: Diagnosis not present

## 2015-11-23 ENCOUNTER — Encounter (HOSPITAL_BASED_OUTPATIENT_CLINIC_OR_DEPARTMENT_OTHER): Payer: Medicare Other | Attending: Surgery

## 2015-11-23 DIAGNOSIS — I739 Peripheral vascular disease, unspecified: Secondary | ICD-10-CM | POA: Diagnosis not present

## 2015-11-23 DIAGNOSIS — F0390 Unspecified dementia without behavioral disturbance: Secondary | ICD-10-CM | POA: Insufficient documentation

## 2015-11-23 DIAGNOSIS — G629 Polyneuropathy, unspecified: Secondary | ICD-10-CM | POA: Insufficient documentation

## 2015-11-23 DIAGNOSIS — I890 Lymphedema, not elsewhere classified: Secondary | ICD-10-CM | POA: Insufficient documentation

## 2015-11-23 DIAGNOSIS — I2510 Atherosclerotic heart disease of native coronary artery without angina pectoris: Secondary | ICD-10-CM | POA: Insufficient documentation

## 2015-11-23 DIAGNOSIS — L97312 Non-pressure chronic ulcer of right ankle with fat layer exposed: Secondary | ICD-10-CM | POA: Diagnosis not present

## 2015-11-23 DIAGNOSIS — M1990 Unspecified osteoarthritis, unspecified site: Secondary | ICD-10-CM | POA: Diagnosis not present

## 2015-12-07 DIAGNOSIS — L97312 Non-pressure chronic ulcer of right ankle with fat layer exposed: Secondary | ICD-10-CM | POA: Diagnosis not present

## 2015-12-21 ENCOUNTER — Encounter (HOSPITAL_BASED_OUTPATIENT_CLINIC_OR_DEPARTMENT_OTHER): Payer: Medicare Other | Attending: Surgery

## 2015-12-21 DIAGNOSIS — I2510 Atherosclerotic heart disease of native coronary artery without angina pectoris: Secondary | ICD-10-CM | POA: Insufficient documentation

## 2015-12-21 DIAGNOSIS — I890 Lymphedema, not elsewhere classified: Secondary | ICD-10-CM | POA: Insufficient documentation

## 2015-12-21 DIAGNOSIS — Z950 Presence of cardiac pacemaker: Secondary | ICD-10-CM | POA: Diagnosis not present

## 2015-12-21 DIAGNOSIS — L97512 Non-pressure chronic ulcer of other part of right foot with fat layer exposed: Secondary | ICD-10-CM | POA: Insufficient documentation

## 2015-12-21 DIAGNOSIS — I739 Peripheral vascular disease, unspecified: Secondary | ICD-10-CM | POA: Insufficient documentation

## 2015-12-21 DIAGNOSIS — G629 Polyneuropathy, unspecified: Secondary | ICD-10-CM | POA: Insufficient documentation

## 2015-12-21 DIAGNOSIS — F0390 Unspecified dementia without behavioral disturbance: Secondary | ICD-10-CM | POA: Insufficient documentation

## 2015-12-21 DIAGNOSIS — L97511 Non-pressure chronic ulcer of other part of right foot limited to breakdown of skin: Secondary | ICD-10-CM | POA: Insufficient documentation

## 2015-12-21 DIAGNOSIS — Z96653 Presence of artificial knee joint, bilateral: Secondary | ICD-10-CM | POA: Insufficient documentation

## 2016-01-04 DIAGNOSIS — L97512 Non-pressure chronic ulcer of other part of right foot with fat layer exposed: Secondary | ICD-10-CM | POA: Diagnosis not present

## 2016-01-18 ENCOUNTER — Encounter (HOSPITAL_BASED_OUTPATIENT_CLINIC_OR_DEPARTMENT_OTHER): Payer: Medicare Other | Attending: Surgery

## 2016-01-18 DIAGNOSIS — Z950 Presence of cardiac pacemaker: Secondary | ICD-10-CM | POA: Insufficient documentation

## 2016-01-18 DIAGNOSIS — Z96653 Presence of artificial knee joint, bilateral: Secondary | ICD-10-CM | POA: Insufficient documentation

## 2016-01-18 DIAGNOSIS — I890 Lymphedema, not elsewhere classified: Secondary | ICD-10-CM | POA: Insufficient documentation

## 2016-01-18 DIAGNOSIS — L97312 Non-pressure chronic ulcer of right ankle with fat layer exposed: Secondary | ICD-10-CM | POA: Insufficient documentation

## 2016-01-18 DIAGNOSIS — I739 Peripheral vascular disease, unspecified: Secondary | ICD-10-CM | POA: Insufficient documentation

## 2016-01-18 DIAGNOSIS — I2510 Atherosclerotic heart disease of native coronary artery without angina pectoris: Secondary | ICD-10-CM | POA: Insufficient documentation

## 2016-01-18 DIAGNOSIS — F0390 Unspecified dementia without behavioral disturbance: Secondary | ICD-10-CM | POA: Insufficient documentation

## 2016-01-18 DIAGNOSIS — G629 Polyneuropathy, unspecified: Secondary | ICD-10-CM | POA: Insufficient documentation

## 2016-01-25 DIAGNOSIS — I2510 Atherosclerotic heart disease of native coronary artery without angina pectoris: Secondary | ICD-10-CM | POA: Diagnosis not present

## 2016-01-25 DIAGNOSIS — L97312 Non-pressure chronic ulcer of right ankle with fat layer exposed: Secondary | ICD-10-CM | POA: Diagnosis not present

## 2016-01-25 DIAGNOSIS — I739 Peripheral vascular disease, unspecified: Secondary | ICD-10-CM | POA: Diagnosis not present

## 2016-01-25 DIAGNOSIS — Z950 Presence of cardiac pacemaker: Secondary | ICD-10-CM | POA: Diagnosis not present

## 2016-01-25 DIAGNOSIS — Z96653 Presence of artificial knee joint, bilateral: Secondary | ICD-10-CM | POA: Diagnosis not present

## 2016-01-25 DIAGNOSIS — I890 Lymphedema, not elsewhere classified: Secondary | ICD-10-CM | POA: Diagnosis not present

## 2016-01-25 DIAGNOSIS — G629 Polyneuropathy, unspecified: Secondary | ICD-10-CM | POA: Diagnosis not present

## 2016-01-25 DIAGNOSIS — F0390 Unspecified dementia without behavioral disturbance: Secondary | ICD-10-CM | POA: Diagnosis not present

## 2016-02-08 DIAGNOSIS — L97312 Non-pressure chronic ulcer of right ankle with fat layer exposed: Secondary | ICD-10-CM | POA: Diagnosis not present

## 2016-02-15 ENCOUNTER — Encounter (HOSPITAL_COMMUNITY): Payer: Self-pay | Admitting: Emergency Medicine

## 2016-02-15 ENCOUNTER — Emergency Department (HOSPITAL_COMMUNITY): Payer: Medicare Other

## 2016-02-15 ENCOUNTER — Inpatient Hospital Stay (HOSPITAL_COMMUNITY)
Admission: EM | Admit: 2016-02-15 | Discharge: 2016-02-21 | DRG: 871 | Disposition: A | Discharge status: Skilled Nursing Facility | Payer: Medicare Other | Attending: Internal Medicine | Admitting: Internal Medicine

## 2016-02-15 DIAGNOSIS — Z8781 Personal history of (healed) traumatic fracture: Secondary | ICD-10-CM

## 2016-02-15 DIAGNOSIS — I5181 Takotsubo syndrome: Secondary | ICD-10-CM | POA: Diagnosis present

## 2016-02-15 DIAGNOSIS — R109 Unspecified abdominal pain: Secondary | ICD-10-CM

## 2016-02-15 DIAGNOSIS — F0390 Unspecified dementia without behavioral disturbance: Secondary | ICD-10-CM | POA: Diagnosis present

## 2016-02-15 DIAGNOSIS — N1330 Unspecified hydronephrosis: Secondary | ICD-10-CM | POA: Diagnosis present

## 2016-02-15 DIAGNOSIS — I248 Other forms of acute ischemic heart disease: Secondary | ICD-10-CM | POA: Diagnosis present

## 2016-02-15 DIAGNOSIS — Z9181 History of falling: Secondary | ICD-10-CM

## 2016-02-15 DIAGNOSIS — I87311 Chronic venous hypertension (idiopathic) with ulcer of right lower extremity: Secondary | ICD-10-CM | POA: Diagnosis present

## 2016-02-15 DIAGNOSIS — A4151 Sepsis due to Escherichia coli [E. coli]: Principal | ICD-10-CM | POA: Diagnosis present

## 2016-02-15 DIAGNOSIS — Z9842 Cataract extraction status, left eye: Secondary | ICD-10-CM

## 2016-02-15 DIAGNOSIS — Z79899 Other long term (current) drug therapy: Secondary | ICD-10-CM

## 2016-02-15 DIAGNOSIS — R296 Repeated falls: Secondary | ICD-10-CM | POA: Diagnosis present

## 2016-02-15 DIAGNOSIS — R748 Abnormal levels of other serum enzymes: Secondary | ICD-10-CM | POA: Diagnosis not present

## 2016-02-15 DIAGNOSIS — J189 Pneumonia, unspecified organism: Secondary | ICD-10-CM

## 2016-02-15 DIAGNOSIS — R6520 Severe sepsis without septic shock: Secondary | ICD-10-CM | POA: Diagnosis present

## 2016-02-15 DIAGNOSIS — Z8249 Family history of ischemic heart disease and other diseases of the circulatory system: Secondary | ICD-10-CM

## 2016-02-15 DIAGNOSIS — K5641 Fecal impaction: Secondary | ICD-10-CM | POA: Diagnosis present

## 2016-02-15 DIAGNOSIS — R7881 Bacteremia: Secondary | ICD-10-CM | POA: Diagnosis not present

## 2016-02-15 DIAGNOSIS — A419 Sepsis, unspecified organism: Secondary | ICD-10-CM | POA: Diagnosis not present

## 2016-02-15 DIAGNOSIS — K56609 Unspecified intestinal obstruction, unspecified as to partial versus complete obstruction: Secondary | ICD-10-CM | POA: Diagnosis present

## 2016-02-15 DIAGNOSIS — Z9049 Acquired absence of other specified parts of digestive tract: Secondary | ICD-10-CM

## 2016-02-15 DIAGNOSIS — Z96653 Presence of artificial knee joint, bilateral: Secondary | ICD-10-CM | POA: Diagnosis present

## 2016-02-15 DIAGNOSIS — B9620 Unspecified Escherichia coli [E. coli] as the cause of diseases classified elsewhere: Secondary | ICD-10-CM | POA: Diagnosis present

## 2016-02-15 DIAGNOSIS — G9341 Metabolic encephalopathy: Secondary | ICD-10-CM | POA: Diagnosis present

## 2016-02-15 DIAGNOSIS — N390 Urinary tract infection, site not specified: Secondary | ICD-10-CM | POA: Diagnosis present

## 2016-02-15 DIAGNOSIS — Z803 Family history of malignant neoplasm of breast: Secondary | ICD-10-CM

## 2016-02-15 DIAGNOSIS — R4182 Altered mental status, unspecified: Secondary | ICD-10-CM | POA: Diagnosis present

## 2016-02-15 DIAGNOSIS — Z9841 Cataract extraction status, right eye: Secondary | ICD-10-CM

## 2016-02-15 DIAGNOSIS — N179 Acute kidney failure, unspecified: Secondary | ICD-10-CM | POA: Diagnosis present

## 2016-02-15 DIAGNOSIS — Z7982 Long term (current) use of aspirin: Secondary | ICD-10-CM

## 2016-02-15 DIAGNOSIS — G629 Polyneuropathy, unspecified: Secondary | ICD-10-CM | POA: Diagnosis present

## 2016-02-15 DIAGNOSIS — E872 Acidosis, unspecified: Secondary | ICD-10-CM | POA: Diagnosis present

## 2016-02-15 DIAGNOSIS — Z91040 Latex allergy status: Secondary | ICD-10-CM

## 2016-02-15 DIAGNOSIS — Z950 Presence of cardiac pacemaker: Secondary | ICD-10-CM

## 2016-02-15 DIAGNOSIS — R7989 Other specified abnormal findings of blood chemistry: Secondary | ICD-10-CM | POA: Diagnosis present

## 2016-02-15 DIAGNOSIS — Z66 Do not resuscitate: Secondary | ICD-10-CM | POA: Diagnosis present

## 2016-02-15 DIAGNOSIS — R778 Other specified abnormalities of plasma proteins: Secondary | ICD-10-CM | POA: Diagnosis present

## 2016-02-15 DIAGNOSIS — I5032 Chronic diastolic (congestive) heart failure: Secondary | ICD-10-CM | POA: Diagnosis present

## 2016-02-15 DIAGNOSIS — I480 Paroxysmal atrial fibrillation: Secondary | ICD-10-CM | POA: Diagnosis present

## 2016-02-15 DIAGNOSIS — Z961 Presence of intraocular lens: Secondary | ICD-10-CM | POA: Diagnosis present

## 2016-02-15 DIAGNOSIS — R7889 Finding of other specified substances, not normally found in blood: Secondary | ICD-10-CM | POA: Diagnosis not present

## 2016-02-15 DIAGNOSIS — L97519 Non-pressure chronic ulcer of other part of right foot with unspecified severity: Secondary | ICD-10-CM | POA: Diagnosis present

## 2016-02-15 DIAGNOSIS — Z79891 Long term (current) use of opiate analgesic: Secondary | ICD-10-CM

## 2016-02-15 LAB — COMPREHENSIVE METABOLIC PANEL
ALBUMIN: 3.6 g/dL (ref 3.5–5.0)
ALT: 21 U/L (ref 14–54)
AST: 50 U/L — ABNORMAL HIGH (ref 15–41)
Alkaline Phosphatase: 77 U/L (ref 38–126)
Anion gap: 13 (ref 5–15)
BUN: 26 mg/dL — AB (ref 6–20)
CHLORIDE: 101 mmol/L (ref 101–111)
CO2: 25 mmol/L (ref 22–32)
CREATININE: 1.52 mg/dL — AB (ref 0.44–1.00)
Calcium: 9.5 mg/dL (ref 8.9–10.3)
GFR calc Af Amer: 33 mL/min — ABNORMAL LOW (ref >60)
GFR calc non Af Amer: 28 mL/min — ABNORMAL LOW (ref >60)
GLUCOSE: 154 mg/dL — AB (ref 65–99)
POTASSIUM: 4.3 mmol/L (ref 3.5–5.1)
SODIUM: 139 mmol/L (ref 135–145)
Total Bilirubin: 0.9 mg/dL (ref 0.3–1.2)
Total Protein: 8.3 g/dL — ABNORMAL HIGH (ref 6.5–8.1)

## 2016-02-15 LAB — CBC WITH DIFFERENTIAL/PLATELET
Basophils Absolute: 0 10*3/uL (ref 0.0–0.1)
Basophils Relative: 0 %
EOS ABS: 0 10*3/uL (ref 0.0–0.7)
EOS PCT: 0 %
HCT: 40.3 % (ref 36.0–46.0)
Hemoglobin: 13.1 g/dL (ref 12.0–15.0)
LYMPHS ABS: 0.6 10*3/uL — AB (ref 0.7–4.0)
Lymphocytes Relative: 3 %
MCH: 30.5 pg (ref 26.0–34.0)
MCHC: 32.5 g/dL (ref 30.0–36.0)
MCV: 93.7 fL (ref 78.0–100.0)
MONOS PCT: 2 %
Monocytes Absolute: 0.3 10*3/uL (ref 0.1–1.0)
Neutro Abs: 17.4 10*3/uL — ABNORMAL HIGH (ref 1.7–7.7)
Neutrophils Relative %: 95 %
PLATELETS: 303 10*3/uL (ref 150–400)
RBC: 4.3 MIL/uL (ref 3.87–5.11)
RDW: 15.7 % — ABNORMAL HIGH (ref 11.5–15.5)
WBC: 18.3 10*3/uL — ABNORMAL HIGH (ref 4.0–10.5)

## 2016-02-15 LAB — PHOSPHORUS: Phosphorus: 2.7 mg/dL (ref 2.5–4.6)

## 2016-02-15 LAB — CBG MONITORING, ED: Glucose-Capillary: 149 mg/dL — ABNORMAL HIGH (ref 65–99)

## 2016-02-15 LAB — I-STAT CG4 LACTIC ACID, ED
LACTIC ACID, VENOUS: 3.79 mmol/L — AB (ref 0.5–1.9)
Lactic Acid, Venous: 4.92 mmol/L (ref 0.5–1.9)
Lactic Acid, Venous: 2.84 mmol/L (ref 0.5–1.9)

## 2016-02-15 LAB — INFLUENZA PANEL BY PCR (TYPE A & B)
INFLAPCR: NEGATIVE
INFLBPCR: NEGATIVE

## 2016-02-15 LAB — URINALYSIS, ROUTINE W REFLEX MICROSCOPIC
BILIRUBIN URINE: NEGATIVE
Glucose, UA: NEGATIVE mg/dL
Ketones, ur: NEGATIVE mg/dL
Nitrite: NEGATIVE
Protein, ur: 30 mg/dL — AB
SPECIFIC GRAVITY, URINE: 1.014 (ref 1.005–1.030)
SQUAMOUS EPITHELIAL / LPF: NONE SEEN
pH: 5 (ref 5.0–8.0)

## 2016-02-15 LAB — I-STAT TROPONIN, ED: Troponin i, poc: 1.26 ng/mL (ref 0.00–0.08)

## 2016-02-15 LAB — TROPONIN I: TROPONIN I: 1.3 ng/mL — AB (ref <0.03)

## 2016-02-15 LAB — PROTIME-INR
INR: 1.08
PROTHROMBIN TIME: 14.1 s (ref 11.4–15.2)

## 2016-02-15 LAB — MAGNESIUM: Magnesium: 1.8 mg/dL (ref 1.7–2.4)

## 2016-02-15 MED ORDER — PIPERACILLIN-TAZOBACTAM 3.375 G IVPB
3.3750 g | INTRAVENOUS | Status: AC
  Administered 2016-02-15: 3.375 g via INTRAVENOUS
  Filled 2016-02-15: qty 50

## 2016-02-15 MED ORDER — SODIUM CHLORIDE 0.9 % IV SOLN
INTRAVENOUS | Status: AC
  Administered 2016-02-15 – 2016-02-16 (×2): via INTRAVENOUS

## 2016-02-15 MED ORDER — ACETAMINOPHEN 325 MG PO TABS
650.0000 mg | ORAL_TABLET | Freq: Four times a day (QID) | ORAL | Status: DC | PRN
  Administered 2016-02-16 – 2016-02-17 (×3): 650 mg via ORAL
  Filled 2016-02-15 (×3): qty 2

## 2016-02-15 MED ORDER — LORAZEPAM 0.5 MG PO TABS: 0.5000 mg | ORAL_TABLET | Freq: Four times a day (QID) | ORAL | Status: DC | PRN

## 2016-02-15 MED ORDER — SODIUM CHLORIDE 0.9% FLUSH
3.0000 mL | Freq: Two times a day (BID) | INTRAVENOUS | Status: DC
  Administered 2016-02-15 – 2016-02-20 (×7): 3 mL via INTRAVENOUS

## 2016-02-15 MED ORDER — ALBUTEROL SULFATE (2.5 MG/3ML) 0.083% IN NEBU: 2.5000 mg | INHALATION_SOLUTION | Freq: Four times a day (QID) | RESPIRATORY_TRACT | Status: DC | PRN

## 2016-02-15 MED ORDER — ENOXAPARIN SODIUM 30 MG/0.3ML ~~LOC~~ SOLN
30.0000 mg | SUBCUTANEOUS | Status: DC
  Administered 2016-02-15 – 2016-02-17 (×2): 30 mg via SUBCUTANEOUS
  Filled 2016-02-15 (×2): qty 0.3

## 2016-02-15 MED ORDER — GABAPENTIN 300 MG PO CAPS
300.0000 mg | ORAL_CAPSULE | Freq: Two times a day (BID) | ORAL | Status: DC
  Administered 2016-02-16 – 2016-02-21 (×11): 300 mg via ORAL
  Filled 2016-02-15 (×12): qty 1

## 2016-02-15 MED ORDER — ONDANSETRON HCL 4 MG PO TABS: 4.0000 mg | ORAL_TABLET | Freq: Four times a day (QID) | ORAL | Status: DC | PRN

## 2016-02-15 MED ORDER — ENSURE ENLIVE PO LIQD
237.0000 mL | Freq: Two times a day (BID) | ORAL | Status: DC
  Administered 2016-02-16: 237 mL via ORAL

## 2016-02-15 MED ORDER — SODIUM CHLORIDE 0.9 % IV BOLUS (SEPSIS)
1000.0000 mL | Freq: Once | INTRAVENOUS | Status: AC
  Administered 2016-02-15: 1000 mL via INTRAVENOUS

## 2016-02-15 MED ORDER — SENNA 8.6 MG PO TABS
2.0000 | ORAL_TABLET | Freq: Two times a day (BID) | ORAL | Status: DC
  Administered 2016-02-16 – 2016-02-20 (×9): 17.2 mg via ORAL
  Filled 2016-02-15 (×10): qty 2

## 2016-02-15 MED ORDER — LACTULOSE 10 GM/15ML PO SOLN: 30.0000 g | Freq: Two times a day (BID) | ORAL | Status: DC | PRN

## 2016-02-15 MED ORDER — NYSTATIN 100000 UNIT/GM EX POWD
1.0000 | Freq: Three times a day (TID) | CUTANEOUS | Status: DC
  Administered 2016-02-15 – 2016-02-21 (×17): 1 via TOPICAL
  Filled 2016-02-15: qty 15

## 2016-02-15 MED ORDER — POLYETHYLENE GLYCOL 3350 17 G PO PACK: 17.0000 g | PACK | Freq: Every day | ORAL | Status: DC | PRN

## 2016-02-15 MED ORDER — SODIUM CHLORIDE 0.9 % IV BOLUS (SEPSIS): 500.0000 mL | Freq: Once | INTRAVENOUS | Status: DC

## 2016-02-15 MED ORDER — VANCOMYCIN HCL 10 G IV SOLR: 1500.0000 mg | INTRAVENOUS | Status: DC

## 2016-02-15 MED ORDER — ASPIRIN EC 325 MG PO TBEC
325.0000 mg | DELAYED_RELEASE_TABLET | Freq: Every day | ORAL | Status: DC
  Administered 2016-02-16 – 2016-02-21 (×6): 325 mg via ORAL
  Filled 2016-02-15 (×6): qty 1

## 2016-02-15 MED ORDER — VANCOMYCIN HCL 10 G IV SOLR
2000.0000 mg | Freq: Once | INTRAVENOUS | Status: AC
  Administered 2016-02-15: 2000 mg via INTRAVENOUS
  Filled 2016-02-15: qty 2000

## 2016-02-15 MED ORDER — DULOXETINE HCL 60 MG PO CPEP
60.0000 mg | ORAL_CAPSULE | Freq: Every day | ORAL | Status: DC
  Administered 2016-02-16 – 2016-02-21 (×6): 60 mg via ORAL
  Filled 2016-02-15: qty 1
  Filled 2016-02-15: qty 2
  Filled 2016-02-15: qty 1
  Filled 2016-02-15: qty 2
  Filled 2016-02-15: qty 1
  Filled 2016-02-15: qty 2

## 2016-02-15 MED ORDER — ORAL CARE MOUTH RINSE
15.0000 mL | Freq: Two times a day (BID) | OROMUCOSAL | Status: DC
  Administered 2016-02-15 – 2016-02-20 (×9): 15 mL via OROMUCOSAL

## 2016-02-15 MED ORDER — COLLAGENASE 250 UNIT/GM EX OINT
1.0000 "application " | TOPICAL_OINTMENT | CUTANEOUS | Status: DC
  Administered 2016-02-16 – 2016-02-21 (×3): 1 via TOPICAL
  Filled 2016-02-15: qty 30

## 2016-02-15 MED ORDER — SODIUM CHLORIDE 0.9 % IV BOLUS (SEPSIS)
500.0000 mL | Freq: Once | INTRAVENOUS | Status: AC
  Administered 2016-02-15: 500 mL via INTRAVENOUS

## 2016-02-15 MED ORDER — ONDANSETRON HCL 4 MG/2ML IJ SOLN: 4.0000 mg | Freq: Four times a day (QID) | INTRAMUSCULAR | Status: DC | PRN

## 2016-02-15 MED ORDER — IPRATROPIUM BROMIDE 0.02 % IN SOLN: 0.5000 mg | Freq: Four times a day (QID) | RESPIRATORY_TRACT | Status: DC | PRN

## 2016-02-15 MED ORDER — PIPERACILLIN-TAZOBACTAM 3.375 G IVPB
3.3750 g | Freq: Three times a day (TID) | INTRAVENOUS | Status: DC
  Administered 2016-02-15 – 2016-02-18 (×8): 3.375 g via INTRAVENOUS
  Filled 2016-02-15 (×8): qty 50

## 2016-02-15 NOTE — Clinical Social Work Note (Addendum)
Clinical Social Work Assessment  Patient Details  Name: [name] MRN: [id number] Date of Birth: Mar 18, 1919  Date of referral:  02/15/16               Reason for consult:  Discharge Planning, Facility Placement, Length of Stay                Permission sought to share information with:  Facility Art therapist granted to share information::  Yes, Verbal Permission Granted  Name::        Agency::     Relationship::     Contact Information:     Housing/Transportation Living arrangements for the past 2 months:  [place] of Information:  Adult Children Patient Interpreter Needed:  None Criminal Activity/Legal Involvement Pertinent to Current Situation/Hospitalization:    Significant Relationships:  Adult Children Lives with:  Facility Resident Do you feel safe going back to the place where you live?  No Need for family participation in patient care:  Yes (Comment)  Care giving concerns:  Pt's family interested in SNF placement, PT consult not completed yet.   Social Worker assessment / plan:  Assessment   CSW met with pt and confirmed pt's family's interest in plan for  be discharged to SNF, pending outcome of the pt's family's discussion with the pt's doctor.  Pt has been living at [facility] prior to admission.  Pt's daughter [name] at ph: [phone] is interested in completing paperwork for and making decision on DNR pending discussion with the pt's Doctor [name] in the ED on the pt's condition.  Pt's daughter [name] is the only relative living within [place].  Currently she is the only available family contact.  Pt is not oriented at this time       Employment status:  Retired Forensic scientist:  Medicare Part A & B PT Recommendations:    Information / Referral to community resources:  [place]  Patient/Family's Response to care:  Pt's daughter [name] appreicated CSW's efforts and thanked the CSW.   Patient/Family's Understanding of and Emotional Response to Diagnosis, Current Treatment, and Prognosis:  Still assessing  Emotional Assessment Appearance:  Appears stated age Attitude/Demeanor/Rapport:  Unable to Assess Affect (typically observed):  Unable to Assess Orientation:    Alcohol / Substance use:    Psych involvement (Current and /or in the community):  No (Comment)  Discharge Needs  Concerns to be addressed:  Discharge Planning Concerns Readmission within the last 30 days:  No Current discharge risk:  Other (Unsure of discharge plan) Barriers to Discharge:  Other (Unsure of discharge plans)   Whitman Hero 02/15/2016, 6:35 PM

## 2016-02-15 NOTE — Progress Notes (Signed)
Pharmacy Antibiotic Note  [name] RoughJoan Stevens is a 83 y.o. female admitted from nursing facility on 02/15/2016 with sepsis.  Pharmacy has been consulted for Vancomycin & Zosyn dosing. She reports fever, AMS, and N/V x1 day  02/15/2016:  Tm 104.16F  Elevated WBC (18.3) and LA (4.92)  Scr, est CrCl~   CXR + LLL infiltrate  Plan: Zosyn 3.375g IV q8h (4 hour infusion).  Vancomycin 2g IV x1 now then 1500mg  IV q48h Check Vancomycin trough at steady state Monitor renal function and cx data   Height: 5\' 7"  (170.2 cm) Weight: 240 lb (108.9 kg) IBW/kg (Calculated) : 61.6  Temp (24hrs), Avg:104.6 F (40.3 C), Min:104.6 F (40.3 C), Max:104.6 F (40.3 C)   Recent Labs Lab 02/15/16 1555 02/15/16 1610  WBC 18.3*  --   LATICACIDVEN  --  4.92*    CrCl cannot be calculated (Patient's most recent lab result is older than the maximum 21 days allowed.).    Allergies  Allergen Reactions  . Latex Itching, Rash and Other (See Comments)    Bandaging and tape w/adhesive "sometimes get rash; sometimes pretty bad" Causes redness     Antimicrobials this admission: 1/30 Vanc >>  1/30 Zosyn >>   Dose adjustments this admission:  Microbiology results: 1/30 BCx: sent 1/30 UCx: ordered -abx started before obtained MRSA PCR: Influenza PCR:  Thank you for allowing pharmacy to be a part of this patient's care.  Junita PushMichelle Jlyn Cerros, PharmD, BCPS Pager: [phone] 02/15/2016 4:32 PM

## 2016-02-15 NOTE — ED Triage Notes (Signed)
Per EMS [facility], emesis and fever x 1 day, temp 103.3 at the facility. Given  Tylenol 1000 mg po by EMS 10 min ago. Alert and oriented x 4. Weakness/lethargy per EMS. diaphoretic
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
## 2016-02-15 NOTE — ED Notes (Signed)
Bed: [facility]23 Expected date:  Expected time:  Means of arrival:  Comments: Ems  

## 2016-02-15 NOTE — ED Notes (Signed)
Collected one set of blood culture. Pt is difficult stick. US IV was placed after multiple attempts.

## 2016-02-15 NOTE — ED Notes (Signed)
RN aware of lactic value

## 2016-02-15 NOTE — ED Notes (Signed)
Received bedside report from Ikrame, RN 

## 2016-02-15 NOTE — ED Notes (Addendum)
Attempted to call report.  Will call back once I return from taking other pt to floor.

## 2016-02-15 NOTE — ED Notes (Signed)
RN aware of troponin value.

## 2016-02-15 NOTE — ED Provider Notes (Addendum)
Medical screening examination/treatment/procedure(s) were conducted as a shared visit with non-physician practitioner(s) and myself.  I personally evaluated the patient during the encounter.   EKG Interpretation None      83 year old female who presents with AMS. History of complete heart block with pacemaker. From assisted living facility and noted to have AMS today with fever. Reported nausea, vomiting diarrhea for one day.   Febrile, borderline low BP, and near tachycardia on arrival with AMS. Arouses to voice but non-verbal, ill appearing, diaphoretic. Code sepsis activated. Vancomycin, zosyn to be administered. Will CT abd/pelvis given distension of abdomen and n/v/d x 1 day. Left foot wound noted to some surrounding erythema and dried scabs, but no significant swelling, warmth or drainage.   Workup concerning for lactic acid of 4.9, leukocytosis of 18, acute kidney injury with a creatinine of 1.5, elevated troponin of 1.2 related to demand ischemia. No significant EKG changes. Source of infection likely pneumonia as well as UTI. Chest x-ray visualized showing lobar pneumonia and she does have mild oxygen requirement. CT abdomen pelvis visualized and shows no acute intra-abdominal processes. CT head visualized and shows no acute intracranial processes.  Patient discussed with her daughter who is her medical power of attorney. Code status and goals of care reviewed. At this time she would like her mother to be DNR. Patient to be admitted to stepdown by Dr. Robb Matarrtiz.   CRITICAL CARE Performed by: Lavera Guiseana Duo Daeton Kluth   Total critical care time: 35 minutes  Critical care time was exclusive of separately billable procedures and treating other patients.  Critical care was necessary to treat or prevent imminent or life-threatening deterioration.  Critical care was time spent personally by me on the following activities: development of treatment plan with patient and/or surrogate as well as nursing,  discussions with consultants, evaluation of patient's response to treatment, examination of patient, obtaining history from patient or surrogate, ordering and performing treatments and interventions, ordering and review of laboratory studies, ordering and review of radiographic studies, pulse oximetry and re-evaluation of patient's condition.   Lavera Guiseana Duo Kataleah Bejar, MD 02/16/16 [id number]    Lavera Guiseana Duo Sunset Joshi, MD 02/16/16 [phone]
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
## 2016-02-16 ENCOUNTER — Encounter (HOSPITAL_COMMUNITY): Payer: Self-pay | Admitting: Cardiology

## 2016-02-16 ENCOUNTER — Inpatient Hospital Stay (HOSPITAL_COMMUNITY): Payer: Medicare Other

## 2016-02-16 DIAGNOSIS — R7889 Finding of other specified substances, not normally found in blood: Secondary | ICD-10-CM

## 2016-02-16 DIAGNOSIS — N390 Urinary tract infection, site not specified: Secondary | ICD-10-CM

## 2016-02-16 DIAGNOSIS — A419 Sepsis, unspecified organism: Secondary | ICD-10-CM

## 2016-02-16 DIAGNOSIS — J189 Pneumonia, unspecified organism: Secondary | ICD-10-CM

## 2016-02-16 DIAGNOSIS — K56609 Unspecified intestinal obstruction, unspecified as to partial versus complete obstruction: Secondary | ICD-10-CM | POA: Diagnosis present

## 2016-02-16 LAB — COMPREHENSIVE METABOLIC PANEL
ALK PHOS: 52 U/L (ref 38–126)
ALT: 17 U/L (ref 14–54)
AST: 33 U/L (ref 15–41)
Albumin: 2.7 g/dL — ABNORMAL LOW (ref 3.5–5.0)
Anion gap: 8 (ref 5–15)
BUN: 34 mg/dL — AB (ref 6–20)
CALCIUM: 8.1 mg/dL — AB (ref 8.9–10.3)
CHLORIDE: 109 mmol/L (ref 101–111)
CO2: 23 mmol/L (ref 22–32)
CREATININE: 1.55 mg/dL — AB (ref 0.44–1.00)
GFR calc Af Amer: 32 mL/min — ABNORMAL LOW (ref >60)
GFR calc non Af Amer: 27 mL/min — ABNORMAL LOW (ref >60)
GLUCOSE: 134 mg/dL — AB (ref 65–99)
Potassium: 4.1 mmol/L (ref 3.5–5.1)
SODIUM: 140 mmol/L (ref 135–145)
Total Bilirubin: 0.7 mg/dL (ref 0.3–1.2)
Total Protein: 6.5 g/dL (ref 6.5–8.1)

## 2016-02-16 LAB — BLOOD CULTURE ID PANEL (REFLEXED)
ACINETOBACTER BAUMANNII: NOT DETECTED
CANDIDA ALBICANS: NOT DETECTED
CANDIDA KRUSEI: NOT DETECTED
CANDIDA PARAPSILOSIS: NOT DETECTED
Candida glabrata: NOT DETECTED
Candida tropicalis: NOT DETECTED
Carbapenem resistance: NOT DETECTED
ENTEROBACTER CLOACAE COMPLEX: NOT DETECTED
Enterobacteriaceae species: DETECTED — AB
Enterococcus species: NOT DETECTED
Escherichia coli: DETECTED — AB
Haemophilus influenzae: NOT DETECTED
KLEBSIELLA OXYTOCA: NOT DETECTED
KLEBSIELLA PNEUMONIAE: NOT DETECTED
Listeria monocytogenes: NOT DETECTED
Neisseria meningitidis: NOT DETECTED
PSEUDOMONAS AERUGINOSA: NOT DETECTED
Proteus species: NOT DETECTED
STREPTOCOCCUS PYOGENES: NOT DETECTED
STREPTOCOCCUS SPECIES: NOT DETECTED
Serratia marcescens: NOT DETECTED
Staphylococcus aureus (BCID): NOT DETECTED
Staphylococcus species: NOT DETECTED
Streptococcus agalactiae: NOT DETECTED
Streptococcus pneumoniae: NOT DETECTED

## 2016-02-16 LAB — CBC WITH DIFFERENTIAL/PLATELET
BASOS PCT: 0 %
Basophils Absolute: 0 10*3/uL (ref 0.0–0.1)
EOS ABS: 0 10*3/uL (ref 0.0–0.7)
EOS PCT: 0 %
HCT: 32.5 % — ABNORMAL LOW (ref 36.0–46.0)
HEMOGLOBIN: 10.5 g/dL — AB (ref 12.0–15.0)
LYMPHS PCT: 4 %
Lymphs Abs: 1.3 10*3/uL (ref 0.7–4.0)
MCH: 30.8 pg (ref 26.0–34.0)
MCHC: 32.3 g/dL (ref 30.0–36.0)
MCV: 95.3 fL (ref 78.0–100.0)
MONO ABS: 2.3 10*3/uL — AB (ref 0.1–1.0)
Monocytes Relative: 7 %
NEUTROS PCT: 89 %
Neutro Abs: 29.6 10*3/uL — ABNORMAL HIGH (ref 1.7–7.7)
PLATELETS: 260 10*3/uL (ref 150–400)
RBC: 3.41 MIL/uL — AB (ref 3.87–5.11)
RDW: 16.4 % — ABNORMAL HIGH (ref 11.5–15.5)
WBC: 33.2 10*3/uL — AB (ref 4.0–10.5)

## 2016-02-16 LAB — ECHOCARDIOGRAM COMPLETE
HEIGHTINCHES: 66 in
Weight: 3573.22 oz

## 2016-02-16 LAB — LACTIC ACID, PLASMA
LACTIC ACID, VENOUS: 1.9 mmol/L (ref 0.5–1.9)
Lactic Acid, Venous: 1.9 mmol/L (ref 0.5–1.9)

## 2016-02-16 LAB — STREP PNEUMONIAE URINARY ANTIGEN: Strep Pneumo Urinary Antigen: NEGATIVE

## 2016-02-16 LAB — TROPONIN I
TROPONIN I: 1.26 ng/mL — AB (ref <0.03)
Troponin I: 1.33 ng/mL (ref <0.03)

## 2016-02-16 LAB — MRSA PCR SCREENING: MRSA BY PCR: NEGATIVE

## 2016-02-16 MED ORDER — PERFLUTREN LIPID MICROSPHERE
1.0000 mL | INTRAVENOUS | Status: AC | PRN
  Administered 2016-02-16: 4 mL via INTRAVENOUS
  Filled 2016-02-16: qty 10

## 2016-02-16 MED ORDER — PERFLUTREN LIPID MICROSPHERE
INTRAVENOUS | Status: AC
Start: 2016-02-16 — End: 2016-02-16
  Filled 2016-02-16: qty 10

## 2016-02-16 MED ORDER — METOPROLOL TARTRATE 25 MG PO TABS
12.5000 mg | ORAL_TABLET | Freq: Two times a day (BID) | ORAL | Status: DC
  Administered 2016-02-16 – 2016-02-21 (×11): 12.5 mg via ORAL
  Filled 2016-02-16 (×11): qty 1

## 2016-02-16 MED ORDER — BISACODYL 10 MG RE SUPP
10.0000 mg | RECTAL | Status: AC
  Administered 2016-02-16 (×3): 10 mg via RECTAL
  Filled 2016-02-16 (×3): qty 1

## 2016-02-16 MED ORDER — SODIUM CHLORIDE 0.9 % IV SOLN
INTRAVENOUS | Status: DC
  Administered 2016-02-17: 22:00:00 via INTRAVENOUS

## 2016-02-16 MED ORDER — ENSURE ENLIVE PO LIQD
237.0000 mL | Freq: Two times a day (BID) | ORAL | Status: DC
  Administered 2016-02-16 – 2016-02-21 (×11): 237 mL via ORAL

## 2016-02-16 NOTE — Progress Notes (Signed)
Patient's oxygen saturation on RA while logrolling in the bed dropped to low to mid 80s. After returning to supine position, oxygen saturations struggled to improve. Oxygen at 2 L/M [place] was applied and oxygen saturation improved to mid 90s. Will continue to monitor.
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
## 2016-02-16 NOTE — [place] FL2 (Signed)
[place] MEDICAID FL2 LEVEL OF CARE SCREENING TOOL     IDENTIFICATION  Patient Name: [name] Birthdate: 09-21-1919 Sex: female Admission Date (Current Location): 02/15/2016  [employer] and [place] Number:  Producer, television/film/video and Address:  [facility],  [address], [place] [address]      Provider Number: [id number]  Attending Physician Name and Address:  Calvert Cantor, MD  Relative Name and Phone Number:       Current Level of Care: Hospital Recommended Level of Care: Skilled Nursing Facility Prior Approval Number:    Date Approved/Denied:   PASRR Number: [id number]  Discharge Plan: SNF    Current Diagnoses: Patient Active Problem List   Diagnosis Date Noted  . Community acquired pneumonia 02/16/2016  . Bowel obstruction 02/16/2016  . Severe sepsis (HCC) 02/15/2016  . Sepsis secondary to UTI (HCC) 02/15/2016  . Elevated troponin 02/15/2016  . AKI (acute kidney injury) (HCC) 02/15/2016  . Lactic acidosis 02/15/2016  . Ankle fracture, bimalleolar, closed 05/24/2014  . Open fracture ankle, bimalleolar 05/24/2014  . Fall 05/24/2014  . Peripheral neuropathy (HCC) 05/24/2014  . Ankle fracture   . Paroxysmal atrial fibrillation (HCC) 09/11/2011  . UTI (lower urinary tract infection) 08/02/2011  . Syncope 08/02/2011  . Pacemaker dual chamber programmed VDD-Medtr[employer] 04/19/2010  . Cardiac conduction disorder 12/28/2008    Orientation RESPIRATION BLADDER Height & Weight     Place, Self  O2 (2 liters) Incontinent Weight: 223 lb 5.2 oz (101.3 kg) Height:  5\' 6"  (167.6 cm)  BEHAVIORAL SYMPTOMS/MOOD NEUROLOGICAL BOWEL NUTRITION STATUS      Continent Diet (Heart Healthy)  AMBULATORY STATUS COMMUNICATION OF NEEDS Skin   Limited Assist Verbally  (Non pressure wound - right foot)                       Personal Care Assistance Level of Assistance  Bathing, Feeding, Dressing Bathing Assistance: Limited assistance Feeding assistance:  Independent Dressing Assistance: Limited assistance     Functional Limitations Info             SPECIAL CARE FACTORS FREQUENCY                       Contractures      Additional Factors Info  Code Status, Allergies Code Status Info: DNR Allergies Info: Latex           Current Medications (02/16/2016):  This is the current hospital active medication list Current Facility-Administered Medications  Medication Dose Route Frequency Provider Last Rate Last Dose  . 0.9 %  sodium chloride infusion   Intravenous Continuous Calvert Cantor, MD 75 mL/hr at 02/16/16 0932    . acetaminophen (TYLENOL) tablet 650 mg  650 mg Oral Q6H PRN Bobette Mo, MD   650 mg at 02/16/16 1148  . albuterol (PROVENTIL) (2.5 MG/3ML) 0.083% nebulizer solution 2.5 mg  2.5 mg Nebulization Q6H PRN Bobette Mo, MD      . aspirin EC tablet 325 mg  325 mg Oral Daily Bobette Mo, MD   325 mg at 02/16/16 0944  . collagenase (SANTYL) ointment 1 application  1 application Topical Once per day on Mon Wed Fri Bobette Mo, MD   1 application at 02/16/16 [phone]  . DULoxetine (CYMBALTA) DR capsule 60 mg  60 mg Oral Daily Bobette Mo, MD   60 mg at 02/16/16 0945  . enoxaparin (LOVENOX) injection 30 mg  30 mg Subcutaneous Q24H  Bobette Moavid Manuel Ortiz, MD   30 mg at 02/15/16 2244  . feeding supplement (ENSURE ENLIVE) (ENSURE ENLIVE) liquid 237 mL  237 mL Oral BID BM Bobette Moavid Manuel Ortiz, MD   237 mL at 02/16/16 0946  . feeding supplement (ENSURE ENLIVE) (ENSURE ENLIVE) liquid 237 mL  237 mL Oral BID BM Calvert CantorSaima Rizwan, MD   237 mL at 02/16/16 1354  . gabapentin (NEURONTIN) capsule 300 mg  300 mg Oral BID Bobette Moavid Manuel Ortiz, MD   300 mg at 02/16/16 0946  . ipratropium (ATROVENT) nebulizer solution 0.5 mg  0.5 mg Nebulization Q6H PRN Bobette Moavid Manuel Ortiz, MD      . lactulose [facility](CHRONULAC) 10 GM/15ML solution 30 g  30 g Oral BID PRN Bobette Moavid Manuel Ortiz, MD      . LORazepam (ATIVAN) tablet 0.5 mg  0.5 mg Oral  Q6H PRN Bobette Moavid Manuel Ortiz, MD      . MEDLINE mouth rinse  15 mL Mouth Rinse BID Bobette Moavid Manuel Ortiz, MD   15 mL at 02/16/16 0946  . metoprolol tartrate (LOPRESSOR) tablet 12.5 mg  12.5 mg Oral BID Calvert CantorSaima Rizwan, MD   12.5 mg at 02/16/16 1359  . nystatin (MYCOSTATIN/NYSTOP) topical powder 1 Bottle  1 Bottle Topical TID Bobette Moavid Manuel Ortiz, MD   1 Bottle at 02/16/16 1816  . ondansetron (ZOFRAN) tablet 4 mg  4 mg Oral Q6H PRN Bobette Moavid Manuel Ortiz, MD       Or  . ondansetron [facility](ZOFRAN) injection 4 mg  4 mg Intravenous Q6H PRN Bobette Moavid Manuel Ortiz, MD      . piperacillin-tazobactam (ZOSYN) IVPB 3.375 g  3.375 g Intravenous Q8H Loleta Rosendrea M Lilliston, RPH   3.375 g at 02/16/16 1354  . polyethylene glycol (MIRALAX / GLYCOLAX) packet 17 g  17 g Oral Daily PRN Bobette Moavid Manuel Ortiz, MD      . senna [facility](SENOKOT) tablet 17.2 mg  2 tablet Oral BID Bobette Moavid Manuel Ortiz, MD   17.2 mg at 02/16/16 0940  . sodium chloride flush (NS) 0.9 % injection 3 mL  3 mL Intravenous Q12H Bobette Moavid Manuel Ortiz, MD   3 mL at 02/16/16 [id number]     Discharge Medications: Please see discharge summary for a list of discharge medications.  Relevant Imaging Results:  Relevant Lab Results:   Additional Information [id number]  Dorothe PeaJonathan F Anadalay Macdonell, LCSWA

## 2016-02-16 NOTE — Progress Notes (Signed)
Initial Nutrition Assessment  DOCUMENTATION CODES:   Obesity unspecified  INTERVENTION:   Continue Ensure Enlive po BID, each supplement provides 350 kcal and 20 grams of protein RD to continue to monitor for needs  NUTRITION DIAGNOSIS:   Inadequate oral intake related to poor appetite as evidenced by per patient/family report.  GOAL:   Patient will meet greater than or equal to 90% of their needs  MONITOR:   PO intake, Supplement acceptance, Labs, Weight trends, Skin, I & O's  REASON FOR ASSESSMENT:   Low Braden    ASSESSMENT:   83 y.o. female with medical history significant of complete heart block, pacemaker placement, syncope, peripheral neuropathy, depression, constipation who comes from [facility] due to weakness, lethargy, emesis, fever of 103.22F for one day. Per facility staff, the patient normally is able to sit on her chair, but stayed in bed today. She apparently had an episode of emesis, then per facility staff the patient became unresponsive and they call EMS.  Patient in room with daughter at bedside. Pt is lethargic and unable to provide much history. Pt's daughter states the pt has had poor appetite which is mainly attributed to her not liking the food at the assisted living facility she lives in. Pt had some juice, coffee and a bite of muffin this morning. Pt's daughter was getting ready to order lunch for patient. Pt drinks Ensure supplements at her facility and receiving those here.   Pt's daughter states the patient has not had any noticeable weight loss. Unable to give a precise UBW, estimates it is around 220 lb. Nutrition focused physical exam shows no sign of depletion of muscle mass or body fat.  Medications: Dulcolax suppository every 4 hours, Senokot tablet BID Labs reviewed: Mg/Phos/K WNL GFR: 27  Diet Order:  Diet Heart Room service appropriate? Yes; Fluid consistency: Thin  Skin:  Wound (see comment) (Rt foot venous stasis ulcer)  Last  BM:  PTA  Height:   Ht Readings from Last 1 Encounters:  02/15/16 5\' 6"  (1.676 m)    Weight:   Wt Readings from Last 1 Encounters:  02/15/16 223 lb 5.2 oz (101.3 kg)    Ideal Body Weight:  59.1 kg  BMI:  Body mass index is 36.05 kg/m.  Estimated Nutritional Needs:   Kcal:  1700-1900  Protein:  75-85g  Fluid:  1.7-1.9L/day  EDUCATION NEEDS:   No education needs identified at this time  Tilda FrancoLindsey Narciso Stoutenburg, MS, RD, LDN Pager: [phone] After Hours Pager: [phone]

## 2016-02-16 NOTE — Progress Notes (Addendum)
PROGRESS NOTE    Tyffany Waldrop   WJX:[id number]  DOB: 1919/09/18  DOA: 02/15/2016 PCP: [name], MD   Brief Narrative:  [name] is a 83 y.o. female with medical history significant of complete heart block, pacemaker placement, syncope, peripheral neuropathy, depression, constipation who comes from  [facility] due to weakness, lethargy, emesis, fever of 103.38F for one day. Per facility staff, the patient normally is able to sit on her chair, but stayed in bed today. She apparently had an episode of emesis, then per facility staff the patient became unresponsive.  Subjective: Feels well other than some mild pain in upper abdomen. No appetite- only had a bite of breakfast. Agrees to drink ensure.  No cough or dyspnea. No nausea, vomiting or diarrhea today.   Assessment & Plan:   Principal Problem:   Severe Sepsis secondary to UTI, gr neg bacteremia  - fever 104.6- not hypotensive initially but BP dropped to 80s systolic later- WBC elevated - cont Zosyn today-  blood cultures x 1 set growing gr neg rods- d/c Vanc - fever has resolved for now but WBC more elevated- 18.3 >> 33.2 with left shift - LLL infiltrate on CXR but no respiratory symptoms- no other source of infection found - LA improving - cont slow IVF as oral intake is poor  Active Problems:  Unresponsive - due to above? - CT head unrevealing - now alert and communicative  Elevated Troponin/ NSTEMI - likely due to sepsis but may have underlying heart disease - cont to follow- check 2 D ECHO - not a candidate for cath- medically manage for now- will NOT call cardiology today - already on baby aspirin- add low dose Metoprolol today while following BP  Addendum:  ECHO shows EF of 35% and grade 2 diastolic dysfunction, mod TR - follow for fluid overload- cont  Metoprolol consider ACE if Cr improves    Paroxysmal atrial fibrillation - has a pacemaker and HR is usually controlled- not on anticoagulation due to  being a high fall risk    Peripheral neuropathy  - Neurontin    AKI (acute kidney injury) - prerenal- baseline Cr is < 1.0  - follow  Severe constipation - noted on CT abd/pelv - per daughter, this is a chronic issues for her mother - complains of upper abdominal pain - start Dulcolax Q 4 hrs x 3 doses today- reassess tomorrow   DVT prophylaxis: Lovenox Code Status: switch to DNR today- d/w daughter Family Communication: daughter Disposition Plan: cont to follow in SDU today Consultants:   none Procedures:    Antimicrobials:  Anti-infectives    Start     Dose/Rate Route Frequency Ordered Stop   02/17/16 1700  vancomycin (VANCOCIN) 1,500 mg in sodium chloride 0.9 % 500 mL IVPB     1,500 mg 250 mL/hr over 120 Minutes Intravenous Every 48 hours 02/15/16 1813     02/15/16 2200  piperacillin-tazobactam (ZOSYN) IVPB 3.375 g     3.375 g 12.5 mL/hr over 240 Minutes Intravenous Every 8 hours 02/15/16 1813     02/15/16 1700  vancomycin (VANCOCIN) 2,000 mg in sodium chloride 0.9 % 500 mL IVPB     2,000 mg 250 mL/hr over 120 Minutes Intravenous  Once 02/15/16 1631 02/15/16 1906   02/15/16 1615  piperacillin-tazobactam (ZOSYN) IVPB 3.375 g     3.375 g 100 mL/hr over 30 Minutes Intravenous NOW 02/15/16 1602 02/15/16 1642       Objective: Vitals:   02/16/16 0900 02/16/16 0930  02/16/16 1000 02/16/16 1150  BP: 100/70  (!) 145/74   Pulse: 98 100 96 (!) 108  Resp: 19 19 19  (!) 21  Temp:      TempSrc:      SpO2: 91% 93% 94% 94%  Weight:      Height:        Intake/Output Summary (Last 24 hours) at 02/16/16 1204 Last data filed at 02/16/16 1051  Gross per 24 hour  Intake            10841 ml  Output              550 ml  Net            10291 ml   Filed Weights   02/15/16 1603 02/15/16 2215  Weight: 108.9 kg (240 lb) 101.3 kg (223 lb 5.2 oz)    Examination: General exam: Appears comfortable  HEENT: PERRLA, oral mucosa moist, no sclera icterus or thrush Respiratory  system: Clear to auscultation. Respiratory effort normal. Cardiovascular system: S1 & S2 heard, RRR.  No murmurs  Gastrointestinal system: Abdomen soft, non-tender, nondistended. Normal bowel sound. No organomegaly Central nervous system: Alert and oriented. No focal neurological deficits. Extremities: No cyanosis, clubbing or edema Skin: No rashes or ulcers Psychiatry:  Mood & affect appropriate.     Data Reviewed: I have personally reviewed following labs and imaging studies  CBC:  Recent Labs Lab 02/15/16 1555 02/16/16 0327  WBC 18.3* 33.2*  NEUTROABS 17.4* 29.6*  HGB 13.1 10.5*  HCT 40.3 32.5*  MCV 93.7 95.3  PLT 303 260   Basic Metabolic Panel:  Recent Labs Lab 02/15/16 1555 02/15/16 2032 02/16/16 0327  NA 139  --  140  K 4.3  --  4.1  CL 101  --  109  CO2 25  --  23  GLUCOSE 154*  --  134*  BUN 26*  --  34*  CREATININE 1.52*  --  1.55*  CALCIUM 9.5  --  8.1*  MG  --  1.8  --   PHOS  --  2.7  --    GFR: Estimated Creatinine Clearance: 26.7 mL/min (by C-G formula based on SCr of 1.55 mg/dL (H)). Liver Function Tests:  Recent Labs Lab 02/15/16 1555 02/16/16 0327  AST 50* 33  ALT 21 17  ALKPHOS 77 52  BILITOT 0.9 0.7  PROT 8.3* 6.5  ALBUMIN 3.6 2.7*   No results for input(s): LIPASE, AMYLASE in the last 168 hours. No results for input(s): AMMONIA in the last 168 hours. Coagulation Profile:  Recent Labs Lab 02/15/16 1555  INR 1.08   Cardiac Enzymes:  Recent Labs Lab 02/15/16 2032 02/16/16 0327  TROPONINI 1.30* 1.26*   BNP (last 3 results) No results for input(s): PROBNP in the last 8760 hours. HbA1C: No results for input(s): HGBA1C in the last 72 hours. CBG:  Recent Labs Lab 02/15/16 1550  GLUCAP 149*   Lipid Profile: No results for input(s): CHOL, HDL, LDLCALC, TRIG, CHOLHDL, LDLDIRECT in the last 72 hours. Thyroid Function Tests: No results for input(s): TSH, T4TOTAL, FREET4, T3FREE, THYROIDAB in the last 72 hours. Anemia  Panel: No results for input(s): VITAMINB12, FOLATE, FERRITIN, TIBC, IRON, RETICCTPCT in the last 72 hours. Urine analysis:    Component Value Date/Time   COLORURINE AMBER (A) 02/15/2016 1816   APPEARANCEUR CLOUDY (A) 02/15/2016 1816   LABSPEC 1.014 02/15/2016 1816   PHURINE 5.0 02/15/2016 1816   GLUCOSEU NEGATIVE 02/15/2016 1816   HGBUR SMALL (A)  02/15/2016 1816   BILIRUBINUR NEGATIVE 02/15/2016 1816   KETONESUR NEGATIVE 02/15/2016 1816   PROTEINUR 30 (A) 02/15/2016 1816   UROBILINOGEN 0.2 05/25/2014 1330   NITRITE NEGATIVE 02/15/2016 1816   LEUKOCYTESUR LARGE (A) 02/15/2016 1816   Sepsis Labs: @LABRCNTIP (procalcitonin:4,lacticidven:4) ) Recent Results (from the past 240 hour(s))  Blood Culture (routine x 2)     Status: None (Preliminary result)   Collection Time: 02/15/16  3:56 PM  Result Value Ref Range Status   Specimen Description BLOOD BLOOD LEFT FOREARM  Final   Special Requests BOTTLES DRAWN AEROBIC AND ANAEROBIC 5 CC  Final   Culture  Setup Time   Final    GRAM NEGATIVE RODS IN BOTH AEROBIC AND ANAEROBIC BOTTLES Organism ID to follow Performed at [facility], [address] [place], [place] [address]    Culture GRAM NEGATIVE RODS  Final   Report Status PENDING  Incomplete  MRSA PCR Screening     Status: None   Collection Time: 02/15/16 10:30 PM  Result Value Ref Range Status   MRSA by PCR NEGATIVE NEGATIVE Final    Comment:        The GeneXpert MRSA Assay (FDA approved for NASAL specimens only), is one component of a comprehensive MRSA colonization surveillance program. It is not intended to diagnose MRSA infection nor to guide or monitor treatment for MRSA infections.          Radiology Studies: Ct Abdomen Pelvis Wo Contrast  Addendum Date: 02/15/2016   ADDENDUM REPORT: 02/15/2016 17:56 ADDENDUM: Mild stranding within the fat adjacent to the sigmoid colon may be due to venous congestion. Electronically Signed   By: Deatra RobinsonKevin  Herman M.D.   On:  02/15/2016 17:56   Result Date: 02/15/2016 CLINICAL DATA:  Altered mental status. Nausea and vomiting. Diarrhea. EXAM: CT ABDOMEN AND PELVIS WITHOUT CONTRAST TECHNIQUE: Multidetector CT imaging of the abdomen and pelvis was performed following the standard protocol without IV contrast. COMPARISON:  None. FINDINGS: Lower chest: No pulmonary nodules. No visible pleural or pericardial effusion. Hepatobiliary: Normal hepatic size and contours. No perihepatic ascites. No intra- or extrahepatic biliary dilatation. Status post cholecystectomy. Pancreas: There is fatty atrophy of the pancreas. Spleen: Small calcified splenic granuloma. The spleen is otherwise normal. Adrenals/Urinary Tract: Normal adrenal glands. The urinary bladder is distended with mild bilateral hydronephrosis. No evidence of ureteral obstruction. There is an exophytic low-attenuation lesion arising from the midportion of the right kidney. Stomach/Bowel: There is a large amount of stool within the distal sigmoid colon and rectum. There is no dilated small bowel. There is a right lower quadrant Spigelian hernia that contains a nondilated loop of bowel. The appendix is normal. Vascular/Lymphatic: There is atherosclerotic calcification of the non aneurysmal abdominal aorta. The distal right ovarian vein is mildly dilated. No abdominal or pelvic adenopathy. Reproductive: Uterus is unremarkable.  No free fluid in the pelvis. Musculoskeletal: The bones are osteopenic. There is mild height loss of the T11 vertebral body. Normal visualized extrathoracic and extraperitoneal soft tissues. Other: No contributory non-categorized findings. IMPRESSION: 1. Large amount of stool within the rectum and distal sigmoid colon. This may indicate fecal impaction. No evidence of proximal obstruction. 2. Small right lower quadrant Spigelian hernia containing a loop of bowel without evidence of obstruction. 3. Distended urinary bladder with mild bilateral hydronephrosis. No  ureteral obstruction identified. Consider bladder scan and/or catheterization. Electronically Signed: By: Deatra RobinsonKevin  Herman M.D. On: 02/15/2016 17:53   Ct Head Wo Contrast  Result Date: 02/15/2016 CLINICAL DATA:  Altered mental status EXAM: CT HEAD WITHOUT CONTRAST TECHNIQUE: Contiguous axial images were obtained from the base of the skull through the vertex without intravenous contrast. COMPARISON:  05/24/2014 FINDINGS: Brain: Diffuse atrophic changes and chronic white matter ischemic change is again identified. No findings to suggest acute hemorrhage, acute infarction or space-occupying mass lesion are noted. Vascular: No hyperdense vessel or unexpected calcification. Skull: Normal. Negative for fracture or focal lesion. Sinuses/Orbits: No acute finding. Other: None. IMPRESSION: Chronic atrophic and white matter ischemic changes. No acute abnormality noted. Electronically Signed   By: Alcide Clever M.D.   On: 02/15/2016 17:36   Dg Chest Portable 1 View  Result Date: 02/15/2016 CLINICAL DATA:  Flu symptoms, fever to 105 degrees, shortness of breath, weakness, dementia EXAM: PORTABLE CHEST 1 VIEW COMPARISON:  Portable exam 1556 hours compared to 05/23/2014 FINDINGS: LEFT subclavian pacemaker leads project over RIGHT atrium and RIGHT ventricle unchanged. Borderline enlargement of cardiac silhouette with pulmonary vascular congestion. Atherosclerotic calcification aorta. LEFT lower lobe infiltrate. Remaining lungs clear. No pleural effusion or pneumothorax. Bones demineralized with BILATERAL chronic rotator cuff tears. IMPRESSION: LEFT lower lobe infiltrate. Electronically Signed   By: Ulyses Southward M.D.   On: 02/15/2016 16:22      Scheduled Meds: . aspirin EC  325 mg Oral Daily  . bisacodyl  10 mg Rectal Q4H  . collagenase  1 application Topical Once per day on Mon Wed Fri  . DULoxetine  60 mg Oral Daily  . enoxaparin (LOVENOX) injection  30 mg Subcutaneous Q24H  . feeding supplement (ENSURE ENLIVE)  237  mL Oral BID BM  . feeding supplement (ENSURE ENLIVE)  237 mL Oral BID BM  . gabapentin  300 mg Oral BID  . mouth rinse  15 mL Mouth Rinse BID  . nystatin  1 Bottle Topical TID  . piperacillin-tazobactam (ZOSYN)  IV  3.375 g Intravenous Q8H  . senna  2 tablet Oral BID  . sodium chloride flush  3 mL Intravenous Q12H  . [START ON 02/17/2016] vancomycin  1,500 mg Intravenous Q48H   Continuous Infusions: . sodium chloride 75 mL/hr at 02/16/16 0932     LOS: 1 day    Time spent in minutes: 40    Lenorris Karger, MD [facility] Pager: [email or web address] Password [facility] 02/16/2016, 12:04 PM

## 2016-02-16 NOTE — Progress Notes (Signed)
PHARMACY - PHYSICIAN COMMUNICATION CRITICAL VALUE ALERT - BLOOD CULTURE IDENTIFICATION (BCID)  Results for orders placed or performed during the hospital encounter of 02/15/16  Blood Culture ID Panel (Reflexed) (Collected: 02/15/2016  3:56 PM)  Result Value Ref Range   Enterococcus species NOT DETECTED NOT DETECTED   Listeria monocytogenes NOT DETECTED NOT DETECTED   Staphylococcus species NOT DETECTED NOT DETECTED   Staphylococcus aureus NOT DETECTED NOT DETECTED   Streptococcus species NOT DETECTED NOT DETECTED   Streptococcus agalactiae NOT DETECTED NOT DETECTED   Streptococcus pneumoniae NOT DETECTED NOT DETECTED   Streptococcus pyogenes NOT DETECTED NOT DETECTED   Acinetobacter baumannii NOT DETECTED NOT DETECTED   Enterobacteriaceae species DETECTED (A) NOT DETECTED   Enterobacter cloacae complex NOT DETECTED NOT DETECTED   Escherichia coli DETECTED (A) NOT DETECTED   Klebsiella oxytoca NOT DETECTED NOT DETECTED   Klebsiella pneumoniae NOT DETECTED NOT DETECTED   Proteus species NOT DETECTED NOT DETECTED   Serratia marcescens NOT DETECTED NOT DETECTED   Carbapenem resistance NOT DETECTED NOT DETECTED   Haemophilus influenzae NOT DETECTED NOT DETECTED   Neisseria meningitidis NOT DETECTED NOT DETECTED   Pseudomonas aeruginosa NOT DETECTED NOT DETECTED   Candida albicans NOT DETECTED NOT DETECTED   Candida glabrata NOT DETECTED NOT DETECTED   Candida krusei NOT DETECTED NOT DETECTED   Candida parapsilosis NOT DETECTED NOT DETECTED   Candida tropicalis NOT DETECTED NOT DETECTED    Name of physician (or Provider) Contacted: Dr Butler Denmarkizwan via text page  Changes to prescribed antibiotics required: no changes to antibiotics, WBC increasing  [name], [name] 02/16/2016  2:08 PM

## 2016-02-16 NOTE — Progress Notes (Signed)
  Echocardiogram 2D Echocardiogram with definity has been performed.  Leta JunglingCooper, Jamin Panther M 02/16/2016, 11:05 AM

## 2016-02-17 LAB — CBC WITH DIFFERENTIAL/PLATELET
BASOS ABS: 0 10*3/uL (ref 0.0–0.1)
BASOS PCT: 0 %
EOS PCT: 0 %
Eosinophils Absolute: 0 10*3/uL (ref 0.0–0.7)
HCT: 35.5 % — ABNORMAL LOW (ref 36.0–46.0)
Hemoglobin: 11.6 g/dL — ABNORMAL LOW (ref 12.0–15.0)
Lymphocytes Relative: 6 %
Lymphs Abs: 1.9 10*3/uL (ref 0.7–4.0)
MCH: 30.8 pg (ref 26.0–34.0)
MCHC: 32.7 g/dL (ref 30.0–36.0)
MCV: 94.2 fL (ref 78.0–100.0)
MONO ABS: 2.1 10*3/uL — AB (ref 0.1–1.0)
Monocytes Relative: 7 %
NEUTROS ABS: 28.2 10*3/uL — AB (ref 1.7–7.7)
Neutrophils Relative %: 88 %
PLATELETS: 277 10*3/uL (ref 150–400)
RBC: 3.77 MIL/uL — AB (ref 3.87–5.11)
RDW: 16.9 % — AB (ref 11.5–15.5)
WBC: 32.3 10*3/uL — AB (ref 4.0–10.5)

## 2016-02-17 LAB — BASIC METABOLIC PANEL
ANION GAP: 10 (ref 5–15)
BUN: 35 mg/dL — ABNORMAL HIGH (ref 6–20)
CALCIUM: 8.3 mg/dL — AB (ref 8.9–10.3)
CO2: 21 mmol/L — ABNORMAL LOW (ref 22–32)
Chloride: 106 mmol/L (ref 101–111)
Creatinine, Ser: 1.28 mg/dL — ABNORMAL HIGH (ref 0.44–1.00)
GFR, EST AFRICAN AMERICAN: 40 mL/min — AB (ref >60)
GFR, EST NON AFRICAN AMERICAN: 35 mL/min — AB (ref >60)
Glucose, Bld: 130 mg/dL — ABNORMAL HIGH (ref 65–99)
POTASSIUM: 3.8 mmol/L (ref 3.5–5.1)
Sodium: 137 mmol/L (ref 135–145)

## 2016-02-17 MED ORDER — MORPHINE SULFATE (PF) 2 MG/ML IV SOLN
INTRAVENOUS | Status: AC
  Filled 2016-02-17: qty 1

## 2016-02-17 MED ORDER — MAGNESIUM CITRATE PO SOLN
1.0000 | Freq: Once | ORAL | Status: AC
  Administered 2016-02-17: 1 via ORAL
  Filled 2016-02-17: qty 296

## 2016-02-17 MED ORDER — METOCLOPRAMIDE HCL 5 MG/ML IJ SOLN
10.0000 mg | Freq: Four times a day (QID) | INTRAMUSCULAR | Status: DC
  Administered 2016-02-17 – 2016-02-21 (×15): 10 mg via INTRAVENOUS
  Filled 2016-02-17 (×17): qty 2

## 2016-02-17 MED ORDER — LACTULOSE 10 GM/15ML PO SOLN
30.0000 g | Freq: Two times a day (BID) | ORAL | Status: DC
  Administered 2016-02-17 – 2016-02-21 (×7): 30 g via ORAL
  Filled 2016-02-17 (×7): qty 45

## 2016-02-17 MED ORDER — ENOXAPARIN SODIUM 40 MG/0.4ML ~~LOC~~ SOLN
40.0000 mg | SUBCUTANEOUS | Status: DC
  Administered 2016-02-17 – 2016-02-20 (×4): 40 mg via SUBCUTANEOUS
  Filled 2016-02-17 (×4): qty 0.4

## 2016-02-17 MED ORDER — SORBITOL 70 % SOLN
960.0000 mL | TOPICAL_OIL | Freq: Once | ORAL | Status: DC
  Filled 2016-02-17: qty 240

## 2016-02-17 MED ORDER — MORPHINE SULFATE (PF) 2 MG/ML IV SOLN
1.0000 mg | Freq: Once | INTRAVENOUS | Status: AC
  Administered 2016-02-17: 1 mg via INTRAVENOUS

## 2016-02-17 NOTE — Progress Notes (Signed)
Progress Note  Patient Name: [name] Date of Encounter: 02/17/2016  Primary Cardiologist: Dr Graciela Husbands  Subjective   Main complaint is abdominal pain  Inpatient Medications    Scheduled Meds: . aspirin EC  325 mg Oral Daily  . collagenase  1 application Topical Once per day on Mon Wed Fri  . DULoxetine  60 mg Oral Daily  . enoxaparin (LOVENOX) injection  30 mg Subcutaneous Q24H  . feeding supplement (ENSURE ENLIVE)  237 mL Oral BID BM  . feeding supplement (ENSURE ENLIVE)  237 mL Oral BID BM  . gabapentin  300 mg Oral BID  . mouth rinse  15 mL Mouth Rinse BID  . metoprolol tartrate  12.5 mg Oral BID  . nystatin  1 Bottle Topical TID  . piperacillin-tazobactam (ZOSYN)  IV  3.375 g Intravenous Q8H  . senna  2 tablet Oral BID  . sodium chloride flush  3 mL Intravenous Q12H   Continuous Infusions: . sodium chloride 75 mL/hr at 02/16/16 0932   PRN Meds: acetaminophen, albuterol, ipratropium, lactulose, LORazepam, ondansetron **OR** ondansetron (ZOFRAN) IV, polyethylene glycol   Vital Signs    Vitals:   02/17/16 0300 02/17/16 0400 02/17/16 0500 02/17/16 0740  BP:  (!) 143/73    Pulse: 60 61 60   Resp: (!) 22 (!) 25 (!) 22   Temp:    97.6 F (36.4 C)  TempSrc:    Oral  SpO2: 94% 91% 93%   Weight:      Height:        Intake/Output Summary (Last 24 hours) at 02/17/16 1610 Last data filed at 02/17/16 0546  Gross per 24 hour  Intake             1275 ml  Output              300 ml  Net              975 ml   Filed Weights   02/15/16 1603 02/15/16 2215  Weight: 240 lb (108.9 kg) 223 lb 5.2 oz (101.3 kg)    Telemetry    V-paced with runs of NSVT - Personally Reviewed  ECG    V-paced- Personally Reviewed  Physical Exam   GEN: No acute distress.   Neck: No JVD Cardiac: RRR, no murmurs, rubs, or gallops.  Respiratory: Clear to auscultation bilaterally. GI: Soft, nontender, non-distended  MS: No edema; No deformity. Neuro:  Nonfocal  Psych: Normal affect    Labs    Chemistry Recent Labs Lab 02/15/16 1555 02/16/16 0327 02/17/16 0338  NA 139 140 137  K 4.3 4.1 3.8  CL 101 109 106  CO2 25 23 21*  GLUCOSE 154* 134* 130*  BUN 26* 34* 35*  CREATININE 1.52* 1.55* 1.28*  CALCIUM 9.5 8.1* 8.3*  PROT 8.3* 6.5  --   ALBUMIN 3.6 2.7*  --   AST 50* 33  --   ALT 21 17  --   ALKPHOS 77 52  --   BILITOT 0.9 0.7  --   GFRNONAA 28* 27* 35*  GFRAA 33* 32* 40*  ANIONGAP 13 8 10      Hematology Recent Labs Lab 02/15/16 1555 02/16/16 0327 02/17/16 0338  WBC 18.3* 33.2* 32.3*  RBC 4.30 3.41* 3.77*  HGB 13.1 10.5* 11.6*  HCT 40.3 32.5* 35.5*  MCV 93.7 95.3 94.2  MCH 30.5 30.8 30.8  MCHC 32.5 32.3 32.7  RDW 15.7* 16.4* 16.9*  PLT 303 260 277  Cardiac Enzymes Recent Labs Lab 02/15/16 2032 02/16/16 0327 02/16/16 1525  TROPONINI 1.30* 1.26* 1.33*    Recent Labs Lab 02/15/16 1608  TROPIPOC 1.26*     BNPNo results for input(s): BNP, PROBNP in the last 168 hours.   DDimer No results for input(s): DDIMER in the last 168 hours.   Radiology    Ct Abdomen Pelvis Wo Contrast  Addendum Date: 02/15/2016   ADDENDUM REPORT: 02/15/2016 17:56 ADDENDUM: Mild stranding within the fat adjacent to the sigmoid colon may be due to venous congestion. Electronically Signed   By: Deatra RobinsonKevin  Herman M.D.   On: 02/15/2016 17:56   Result Date: 02/15/2016 CLINICAL DATA:  Altered mental status. Nausea and vomiting. Diarrhea. EXAM: CT ABDOMEN AND PELVIS WITHOUT CONTRAST TECHNIQUE: Multidetector CT imaging of the abdomen and pelvis was performed following the standard protocol without IV contrast. COMPARISON:  None. FINDINGS: Lower chest: No pulmonary nodules. No visible pleural or pericardial effusion. Hepatobiliary: Normal hepatic size and contours. No perihepatic ascites. No intra- or extrahepatic biliary dilatation. Status post cholecystectomy. Pancreas: There is fatty atrophy of the pancreas. Spleen: Small calcified splenic granuloma. The spleen is  otherwise normal. Adrenals/Urinary Tract: Normal adrenal glands. The urinary bladder is distended with mild bilateral hydronephrosis. No evidence of ureteral obstruction. There is an exophytic low-attenuation lesion arising from the midportion of the right kidney. Stomach/Bowel: There is a large amount of stool within the distal sigmoid colon and rectum. There is no dilated small bowel. There is a right lower quadrant Spigelian hernia that contains a nondilated loop of bowel. The appendix is normal. Vascular/Lymphatic: There is atherosclerotic calcification of the non aneurysmal abdominal aorta. The distal right ovarian vein is mildly dilated. No abdominal or pelvic adenopathy. Reproductive: Uterus is unremarkable.  No free fluid in the pelvis. Musculoskeletal: The bones are osteopenic. There is mild height loss of the T11 vertebral body. Normal visualized extrathoracic and extraperitoneal soft tissues. Other: No contributory non-categorized findings. IMPRESSION: 1. Large amount of stool within the rectum and distal sigmoid colon. This may indicate fecal impaction. No evidence of proximal obstruction. 2. Small right lower quadrant Spigelian hernia containing a loop of bowel without evidence of obstruction. 3. Distended urinary bladder with mild bilateral hydronephrosis. No ureteral obstruction identified. Consider bladder scan and/or catheterization. Electronically Signed: By: Deatra RobinsonKevin  Herman M.D. On: 02/15/2016 17:53   Ct Head Wo Contrast  Result Date: 02/15/2016 CLINICAL DATA:  Altered mental status EXAM: CT HEAD WITHOUT CONTRAST TECHNIQUE: Contiguous axial images were obtained from the base of the skull through the vertex without intravenous contrast. COMPARISON:  05/24/2014 FINDINGS: Brain: Diffuse atrophic changes and chronic white matter ischemic change is again identified. No findings to suggest acute hemorrhage, acute infarction or space-occupying mass lesion are noted. Vascular: No hyperdense vessel or  unexpected calcification. Skull: Normal. Negative for fracture or focal lesion. Sinuses/Orbits: No acute finding. Other: None. IMPRESSION: Chronic atrophic and white matter ischemic changes. No acute abnormality noted. Electronically Signed   By: Alcide CleverMark  Lukens M.D.   On: 02/15/2016 17:36   Dg Chest Portable 1 View  Result Date: 02/15/2016 CLINICAL DATA:  Flu symptoms, fever to 105 degrees, shortness of breath, weakness, dementia EXAM: PORTABLE CHEST 1 VIEW COMPARISON:  Portable exam 1556 hours compared to 05/23/2014 FINDINGS: LEFT subclavian pacemaker leads project over RIGHT atrium and RIGHT ventricle unchanged. Borderline enlargement of cardiac silhouette with pulmonary vascular congestion. Atherosclerotic calcification aorta. LEFT lower lobe infiltrate. Remaining lungs clear. No pleural effusion or pneumothorax. Bones demineralized with BILATERAL chronic rotator  cuff tears. IMPRESSION: LEFT lower lobe infiltrate. Electronically Signed   By: Ulyses Southward M.D.   On: 02/15/2016 16:22    Cardiac Studies   Echo 02/16/16 Study Conclusions  - Left ventricle: The cavity size was normal. Wall thickness was   increased in a pattern of moderate LVH. Systolic function was   normal. The estimated ejection fraction was in the range of 35%   to 40%. Akinesis of the apicalanteroseptal, lateral, inferior,   and apical myocardium. Features are consistent with a   pseudonormal left ventricular filling pattern, with concomitant   abnormal relaxation and increased filling pressure (grade 2   diastolic dysfunction). - Aortic valve: Mildly to moderately calcified annulus. - Mitral valve: Mildly to moderately calcified annulus. There was   mild regurgitation. Valve area by continuity equation (using LVOT   flow): 1.67 cm^2. - Tricuspid valve: There was mild-moderate regurgitation. - Pulmonary arteries: Systolic pressure was mildly to moderately   increased. PA peak pressure: 40 mm Hg (S).   Patient Profile      83 y.o. female with a history of a pacemaker, admitted 02/15/16 from assisted living with sepsis. Her Troponin was elevated but not rising. Echo shows LVD with apical AK-EF 35-40%. She also appears to have a bowel obstruction.   Assessment & Plan    1. Elevated Troponin- Flat trend. Echo c/w Takostubo CM  2. Pacemaker- Pt has MDT pacemaker- EOL 20 months. Underlying AF. CHADs VASc=4 but not anticoagulated in the past secondary to a history of falls.   3. Sepsis- Per primary team- WBC up to 32k today.  4. Bowel obstruction- This has not yet resolved.   Plan- Will make her NPO till seen by Dr Butler Denmark. Low dose beta blocker added 02/16/16. Will continue with NSVT on telemetry and cardiomyopathy. No ACE or ARB with acute illness and acute renal injury. I/O is positive 11.5L. Recommend foley and strict I/O. Will hold on Lasix for now but discuss with MD.   Signed, Corine Shelter, PA-C  02/17/2016, 8:22 AM    History and all data above reviewed.  Patient examined.  I agree with the findings as above. I reviewed the echo images.  She does have apical akinesis in a patterns consistent with Takatsubo's.  However, she is otherwise hyperdynamic.  The patient exam reveals COR:RRR  ,  Lungs: Decreased breath sounds  ,  Abd: Distended with absent bowel sounds, Ext mild/mod diffuse edema  .  All available labs, radiology testing, previous records reviewed. Agree with documented assessment and plan. CM:  No plan for ischemia work up.  Conservative medical management.  Agree with plans as above. Hold diuretic for now.     Fayrene Fearing Tangala Wiegert  10:49 AM  02/17/2016

## 2016-02-17 NOTE — Progress Notes (Signed)
MD made aware of pt. Having bowel movements without having to have SMOG enema. Per MD, if pt does not have a bowel movement within a 4 hour time period to give the SMOG enema. RN will continue to monitor.

## 2016-02-17 NOTE — Progress Notes (Addendum)
PROGRESS NOTE    [name]   EXB:[id number]  DOB: 28-Feb-1919  DOA: 02/15/2016 PCP: [name] Jenny, MD   Brief Narrative:  [name] is a 83 y.o. female with medical history significant of complete heart block, pacemaker placement, syncope, peripheral neuropathy, depression, constipation who comes from  [facility] due to weakness, lethargy, emesis, fever of 103.7F for one day. Per facility staff, the patient normally is able to sit on her chair, but stayed in bed today. She apparently had an episode of emesis, then per facility staff the patient became unresponsive. In the ER she was noted to have a UTI, elevated troponin and stool impaction. The following day, 1 set of blood cultures grew out e coli. Further w/u of Troponin revealed she has Takatsubo's cardiomyopathy and an EF of 35%. In regards to stool impaction, despite 3 dulcolax suppositories, she did not have any BMs yesterday. No vomiting & has been drinking Ensure.   Subjective: Per daughter, she has been having abdominal cramping but no stool. No vomiting.    Assessment & Plan:   Principal Problem:   Severe Sepsis secondary to UTI, e coli bacteremia  - fever 104.6- not hypotensive initially but BP dropped to 80s systolic later- WBC elevated - cont Zosyn  -  blood cultures x 1 set growing gr neg rods- BCID showing e coli- awaiting sensitivities - d/c'd Vanc on 1/31 - fever has resolved but WBC more elevated- 18.3 >> 32.3 with left shift - LLL infiltrate on CXR but no respiratory symptoms- no other source of infection found- I have seen severe stool impaction cause leukocytosis in the past - LA improved - cont slow IVF as oral intake is poor  Active Problems:  Unresponsive - due to above? - CT head unrevealing - now alert and communicative  Severe constipation - noted on CT abd/pelv- no obstruction noted but has severe stool impaction- abdomen is distended, bowel sounds are present, no vomiting in hospital  - per  daughter, this is a chronic issue for her mother - given Dulcolax Q 4 hrs yesterday x 3 doses but no results - start enemas today and give oral mag citrate as long as she can tolerate it - cont Lactulose which she takes as outpt  - start IV Reglan to increase Motility as well - cont diet, again, as tolerated- she is actually only drinking ensure and as of yet there has been no vomiting- obviously if she developes vomiting, will make NPO  Elevated Troponin/ NSTEMI -  due to sepsis but may have underlying heart disease - 2 D ECHO showing Takatsubo's - see below - not a candidate for cath- d/w daughter in detail - already on baby aspirin- I added low dose Metoprolol  Takatsubo's Cardiomyopathy - ECHO shows EF of 35- 40% and grade 2 diastolic dysfunction, mod TR - follow for fluid overload - cont  Metoprolol & consider ACE if Cr improves    Paroxysmal atrial fibrillation - has a pacemaker and HR is usually controlled- was not place on anticoagulation by cardiology in the past due to being a high fall risk- cardiology to further decide if anticoagulation is needed    Peripheral neuropathy  - Neurontin    AKI (acute kidney injury) - C r1.52 on admission - prerenal - baseline Cr is < 1.0 - 1. 28 today- cont slow IVF- follow as Cr may worsen if she has numerous BMs with enemas   DVT prophylaxis: Lovenox Code Status: switch to DNR today- d/w  daughter Family Communication: daughter Disposition Plan: cont to follow in SDU today Consultants:   none Procedures:   2 D ECHO Antimicrobials:  Anti-infectives    Start     Dose/Rate Route Frequency Ordered Stop   02/17/16 1700  vancomycin (VANCOCIN) 1,500 mg in sodium chloride 0.9 % 500 mL IVPB  Status:  Discontinued     1,500 mg 250 mL/hr over 120 Minutes Intravenous Every 48 hours 02/15/16 1813 02/16/16 1217   02/15/16 2200  piperacillin-tazobactam (ZOSYN) IVPB 3.375 g     3.375 g 12.5 mL/hr over 240 Minutes Intravenous Every 8 hours  02/15/16 1813     02/15/16 1700  vancomycin (VANCOCIN) 2,000 mg in sodium chloride 0.9 % 500 mL IVPB     2,000 mg 250 mL/hr over 120 Minutes Intravenous  Once 02/15/16 1631 02/15/16 1906   02/15/16 1615  piperacillin-tazobactam (ZOSYN) IVPB 3.375 g     3.375 g 100 mL/hr over 30 Minutes Intravenous NOW 02/15/16 1602 02/15/16 1642       Objective: Vitals:   02/17/16 0400 02/17/16 0500 02/17/16 0740 02/17/16 0800  BP: (!) 143/73   (!) 168/75  Pulse: 61 60  60  Resp: (!) 25 (!) 22  (!) 27  Temp:   97.6 F (36.4 C)   TempSrc:   Oral   SpO2: 91% 93%  90%  Weight:      Height:        Intake/Output Summary (Last 24 hours) at 02/17/16 0956 Last data filed at 02/17/16 0546  Gross per 24 hour  Intake             1275 ml  Output              300 ml  Net              975 ml   Filed Weights   02/15/16 1603 02/15/16 2215  Weight: 108.9 kg (240 lb) 101.3 kg (223 lb 5.2 oz)    Examination: General exam: Appears comfortable - very poorly communicative- daughter dose most of the talking for her HEENT: PERRLA, oral mucosa moist, no sclera icterus or thrush Respiratory system: Clear to auscultation. Respiratory effort normal. Pulse ox 1005 on room air Cardiovascular system: S1 & S2 heard, RRR.  No murmurs  Gastrointestinal system: Abdomen soft, non-tender, quite distended. Normal bowel sound. No organomegaly Central nervous system: Alert and oriented. No focal neurological deficits. Extremities: No cyanosis, clubbing or edema Skin: No rashes or ulcers Psychiatry:  Mood & affect appropriate.     Data Reviewed: I have personally reviewed following labs and imaging studies  CBC:  Recent Labs Lab 02/15/16 1555 02/16/16 0327 02/17/16 0338  WBC 18.3* 33.2* 32.3*  NEUTROABS 17.4* 29.6* 28.2*  HGB 13.1 10.5* 11.6*  HCT 40.3 32.5* 35.5*  MCV 93.7 95.3 94.2  PLT 303 260 277   Basic Metabolic Panel:  Recent Labs Lab 02/15/16 1555 02/15/16 2032 02/16/16 0327 02/17/16 0338  NA  139  --  140 137  K 4.3  --  4.1 3.8  CL 101  --  109 106  CO2 25  --  23 21*  GLUCOSE 154*  --  134* 130*  BUN 26*  --  34* 35*  CREATININE 1.52*  --  1.55* 1.28*  CALCIUM 9.5  --  8.1* 8.3*  MG  --  1.8  --   --   PHOS  --  2.7  --   --    GFR: Estimated Creatinine  Clearance: 32.3 mL/min (by C-G formula based on SCr of 1.28 mg/dL (H)). Liver Function Tests:  Recent Labs Lab 02/15/16 1555 02/16/16 0327  AST 50* 33  ALT 21 17  ALKPHOS 77 52  BILITOT 0.9 0.7  PROT 8.3* 6.5  ALBUMIN 3.6 2.7*   No results for input(s): LIPASE, AMYLASE in the last 168 hours. No results for input(s): AMMONIA in the last 168 hours. Coagulation Profile:  Recent Labs Lab 02/15/16 1555  INR 1.08   Cardiac Enzymes:  Recent Labs Lab 02/15/16 2032 02/16/16 0327 02/16/16 1525  TROPONINI 1.30* 1.26* 1.33*   BNP (last 3 results) No results for input(s): PROBNP in the last 8760 hours. HbA1C: No results for input(s): HGBA1C in the last 72 hours. CBG:  Recent Labs Lab 02/15/16 1550  GLUCAP 149*   Lipid Profile: No results for input(s): CHOL, HDL, LDLCALC, TRIG, CHOLHDL, LDLDIRECT in the last 72 hours. Thyroid Function Tests: No results for input(s): TSH, T4TOTAL, FREET4, T3FREE, THYROIDAB in the last 72 hours. Anemia Panel: No results for input(s): VITAMINB12, FOLATE, FERRITIN, TIBC, IRON, RETICCTPCT in the last 72 hours. Urine analysis:    Component Value Date/Time   COLORURINE AMBER (A) 02/15/2016 1816   APPEARANCEUR CLOUDY (A) 02/15/2016 1816   LABSPEC 1.014 02/15/2016 1816   PHURINE 5.0 02/15/2016 1816   GLUCOSEU NEGATIVE 02/15/2016 1816   HGBUR SMALL (A) 02/15/2016 1816   BILIRUBINUR NEGATIVE 02/15/2016 1816   KETONESUR NEGATIVE 02/15/2016 1816   PROTEINUR 30 (A) 02/15/2016 1816   UROBILINOGEN 0.2 05/25/2014 1330   NITRITE NEGATIVE 02/15/2016 1816   LEUKOCYTESUR LARGE (A) 02/15/2016 1816   Sepsis Labs: @LABRCNTIP (procalcitonin:4,lacticidven:4) ) Recent Results (from  the past 240 hour(s))  Blood Culture (routine x 2)     Status: None (Preliminary result)   Collection Time: 02/15/16  3:56 PM  Result Value Ref Range Status   Specimen Description BLOOD BLOOD LEFT FOREARM  Final   Special Requests BOTTLES DRAWN AEROBIC AND ANAEROBIC 5 CC  Final   Culture  Setup Time   Final    GRAM NEGATIVE RODS IN BOTH AEROBIC AND ANAEROBIC BOTTLES Organism ID to follow CRITICAL RESULT CALLED TO, READ BACK BY AND VERIFIED WITH: Mardee PostinJ. Legge Pharm.D. 12:40 02/16/16 ([email or web address])    Culture GRAM NEGATIVE RODS  Final   Report Status PENDING  Incomplete  Blood Culture ID Panel (Reflexed)     Status: Abnormal   Collection Time: 02/15/16  3:56 PM  Result Value Ref Range Status   Enterococcus species NOT DETECTED NOT DETECTED Final   Listeria monocytogenes NOT DETECTED NOT DETECTED Final   Staphylococcus species NOT DETECTED NOT DETECTED Final   Staphylococcus aureus NOT DETECTED NOT DETECTED Final   Streptococcus species NOT DETECTED NOT DETECTED Final   Streptococcus agalactiae NOT DETECTED NOT DETECTED Final   Streptococcus pneumoniae NOT DETECTED NOT DETECTED Final   Streptococcus pyogenes NOT DETECTED NOT DETECTED Final   Acinetobacter baumannii NOT DETECTED NOT DETECTED Final   Enterobacteriaceae species DETECTED (A) NOT DETECTED Final    Comment: Enterobacteriaceae represent a large family of gram-negative bacteria, not a single organism. CRITICAL RESULT CALLED TO, READ BACK BY AND VERIFIED WITH: Azzie GlatterJ. Legge Pharm.D. 12:40 02/16/16 ([email or web address])    Enterobacter cloacae complex NOT DETECTED NOT DETECTED Final   Escherichia coli DETECTED (A) NOT DETECTED Final    Comment: CRITICAL RESULT CALLED TO, READ BACK BY AND VERIFIED WITH: Mardee PostinJ. Legge Pharm.D. 12:40 02/16/16 ([email or web address])    Klebsiella oxytoca NOT DETECTED NOT DETECTED Final   Klebsiella pneumoniae  NOT DETECTED NOT DETECTED Final   Proteus species NOT DETECTED NOT DETECTED Final   Serratia marcescens NOT DETECTED NOT DETECTED  Final   Carbapenem resistance NOT DETECTED NOT DETECTED Final   Haemophilus influenzae NOT DETECTED NOT DETECTED Final   Neisseria meningitidis NOT DETECTED NOT DETECTED Final   Pseudomonas aeruginosa NOT DETECTED NOT DETECTED Final   Candida albicans NOT DETECTED NOT DETECTED Final   Candida glabrata NOT DETECTED NOT DETECTED Final   Candida krusei NOT DETECTED NOT DETECTED Final   Candida parapsilosis NOT DETECTED NOT DETECTED Final   Candida tropicalis NOT DETECTED NOT DETECTED Final    Comment: Performed at [facility], [address], [place], [place] [address]  Urine culture     Status: Abnormal (Preliminary result)   Collection Time: 02/15/16  6:16 PM  Result Value Ref Range Status   Specimen Description URINE, CATHETERIZED  Final   Special Requests NONE  Final   Culture >=100,000 COLONIES/mL GRAM NEGATIVE RODS (A)  Final   Report Status PENDING  Incomplete  MRSA PCR Screening     Status: None   Collection Time: 02/15/16 10:30 PM  Result Value Ref Range Status   MRSA by PCR NEGATIVE NEGATIVE Final    Comment:        The GeneXpert MRSA Assay (FDA approved for NASAL specimens only), is one component of a comprehensive MRSA colonization surveillance program. It is not intended to diagnose MRSA infection nor to guide or monitor treatment for MRSA infections.          Radiology Studies: Ct Abdomen Pelvis Wo Contrast  Addendum Date: 02/15/2016   ADDENDUM REPORT: 02/15/2016 17:56 ADDENDUM: Mild stranding within the fat adjacent to the sigmoid colon may be due to venous congestion. Electronically Signed   By: Deatra Robinson M.D.   On: 02/15/2016 17:56   Result Date: 02/15/2016 CLINICAL DATA:  Altered mental status. Nausea and vomiting. Diarrhea. EXAM: CT ABDOMEN AND PELVIS WITHOUT CONTRAST TECHNIQUE: Multidetector CT imaging of the abdomen and pelvis was performed following the standard protocol without IV contrast. COMPARISON:  None. FINDINGS: Lower chest: No  pulmonary nodules. No visible pleural or pericardial effusion. Hepatobiliary: Normal hepatic size and contours. No perihepatic ascites. No intra- or extrahepatic biliary dilatation. Status post cholecystectomy. Pancreas: There is fatty atrophy of the pancreas. Spleen: Small calcified splenic granuloma. The spleen is otherwise normal. Adrenals/Urinary Tract: Normal adrenal glands. The urinary bladder is distended with mild bilateral hydronephrosis. No evidence of ureteral obstruction. There is an exophytic low-attenuation lesion arising from the midportion of the right kidney. Stomach/Bowel: There is a large amount of stool within the distal sigmoid colon and rectum. There is no dilated small bowel. There is a right lower quadrant Spigelian hernia that contains a nondilated loop of bowel. The appendix is normal. Vascular/Lymphatic: There is atherosclerotic calcification of the non aneurysmal abdominal aorta. The distal right ovarian vein is mildly dilated. No abdominal or pelvic adenopathy. Reproductive: Uterus is unremarkable.  No free fluid in the pelvis. Musculoskeletal: The bones are osteopenic. There is mild height loss of the T11 vertebral body. Normal visualized extrathoracic and extraperitoneal soft tissues. Other: No contributory non-categorized findings. IMPRESSION: 1. Large amount of stool within the rectum and distal sigmoid colon. This may indicate fecal impaction. No evidence of proximal obstruction. 2. Small right lower quadrant Spigelian hernia containing a loop of bowel without evidence of obstruction. 3. Distended urinary bladder with mild bilateral hydronephrosis. No ureteral obstruction identified. Consider bladder scan and/or catheterization. Electronically  Signed: By: Deatra Robinson M.D. On: 02/15/2016 17:53   Ct Head Wo Contrast  Result Date: 02/15/2016 CLINICAL DATA:  Altered mental status EXAM: CT HEAD WITHOUT CONTRAST TECHNIQUE: Contiguous axial images were obtained from the base of the  skull through the vertex without intravenous contrast. COMPARISON:  05/24/2014 FINDINGS: Brain: Diffuse atrophic changes and chronic white matter ischemic change is again identified. No findings to suggest acute hemorrhage, acute infarction or space-occupying mass lesion are noted. Vascular: No hyperdense vessel or unexpected calcification. Skull: Normal. Negative for fracture or focal lesion. Sinuses/Orbits: No acute finding. Other: None. IMPRESSION: Chronic atrophic and white matter ischemic changes. No acute abnormality noted. Electronically Signed   By: Alcide Clever M.D.   On: 02/15/2016 17:36   Dg Chest Portable 1 View  Result Date: 02/15/2016 CLINICAL DATA:  Flu symptoms, fever to 105 degrees, shortness of breath, weakness, dementia EXAM: PORTABLE CHEST 1 VIEW COMPARISON:  Portable exam 1556 hours compared to 05/23/2014 FINDINGS: LEFT subclavian pacemaker leads project over RIGHT atrium and RIGHT ventricle unchanged. Borderline enlargement of cardiac silhouette with pulmonary vascular congestion. Atherosclerotic calcification aorta. LEFT lower lobe infiltrate. Remaining lungs clear. No pleural effusion or pneumothorax. Bones demineralized with BILATERAL chronic rotator cuff tears. IMPRESSION: LEFT lower lobe infiltrate. Electronically Signed   By: Ulyses Southward M.D.   On: 02/15/2016 16:22      Scheduled Meds: . aspirin EC  325 mg Oral Daily  . collagenase  1 application Topical Once per day on Mon Wed Fri  . DULoxetine  60 mg Oral Daily  . enoxaparin (LOVENOX) injection  30 mg Subcutaneous Q24H  . feeding supplement (ENSURE ENLIVE)  237 mL Oral BID BM  . feeding supplement (ENSURE ENLIVE)  237 mL Oral BID BM  . gabapentin  300 mg Oral BID  . magnesium citrate  1 Bottle Oral Once  . mouth rinse  15 mL Mouth Rinse BID  . metoprolol tartrate  12.5 mg Oral BID  . nystatin  1 Bottle Topical TID  . piperacillin-tazobactam (ZOSYN)  IV  3.375 g Intravenous Q8H  . senna  2 tablet Oral BID  . sodium  chloride flush  3 mL Intravenous Q12H  . sorbitol, milk of mag, mineral oil, glycerin (SMOG) enema  960 mL Rectal Once   Continuous Infusions: . sodium chloride 75 mL/hr at 02/16/16 0932     LOS: 2 days    Time spent in minutes: 40    Justan Gaede, MD [facility] Pager: [email or web address] Password [email or web address] 02/17/2016, 9:56 AM

## 2016-02-18 DIAGNOSIS — B9620 Unspecified Escherichia coli [E. coli] as the cause of diseases classified elsewhere: Secondary | ICD-10-CM

## 2016-02-18 DIAGNOSIS — R7881 Bacteremia: Secondary | ICD-10-CM

## 2016-02-18 DIAGNOSIS — R6520 Severe sepsis without septic shock: Secondary | ICD-10-CM

## 2016-02-18 DIAGNOSIS — N179 Acute kidney failure, unspecified: Secondary | ICD-10-CM

## 2016-02-18 LAB — CBC WITH DIFFERENTIAL/PLATELET
BASOS ABS: 0 10*3/uL (ref 0.0–0.1)
BASOS PCT: 0 %
EOS PCT: 0 %
Eosinophils Absolute: 0 10*3/uL (ref 0.0–0.7)
HEMATOCRIT: 36.1 % (ref 36.0–46.0)
Hemoglobin: 12 g/dL (ref 12.0–15.0)
Lymphocytes Relative: 7 %
Lymphs Abs: 1.9 10*3/uL (ref 0.7–4.0)
MCH: 30.7 pg (ref 26.0–34.0)
MCHC: 33.2 g/dL (ref 30.0–36.0)
MCV: 92.3 fL (ref 78.0–100.0)
MONO ABS: 1.1 10*3/uL — AB (ref 0.1–1.0)
Monocytes Relative: 4 %
Neutro Abs: 24.2 10*3/uL — ABNORMAL HIGH (ref 1.7–7.7)
Neutrophils Relative %: 89 %
Platelets: 244 10*3/uL (ref 150–400)
RBC: 3.91 MIL/uL (ref 3.87–5.11)
RDW: 16.6 % — AB (ref 11.5–15.5)
WBC: 27.2 10*3/uL — ABNORMAL HIGH (ref 4.0–10.5)

## 2016-02-18 LAB — BASIC METABOLIC PANEL
Anion gap: 11 (ref 5–15)
BUN: 32 mg/dL — ABNORMAL HIGH (ref 6–20)
CALCIUM: 8 mg/dL — AB (ref 8.9–10.3)
CO2: 20 mmol/L — AB (ref 22–32)
CREATININE: 1.02 mg/dL — AB (ref 0.44–1.00)
Chloride: 106 mmol/L (ref 101–111)
GFR calc non Af Amer: 46 mL/min — ABNORMAL LOW (ref >60)
GFR, EST AFRICAN AMERICAN: 53 mL/min — AB (ref >60)
Glucose, Bld: 120 mg/dL — ABNORMAL HIGH (ref 65–99)
Potassium: 3.4 mmol/L — ABNORMAL LOW (ref 3.5–5.1)
Sodium: 137 mmol/L (ref 135–145)

## 2016-02-18 LAB — URINE CULTURE

## 2016-02-18 LAB — CULTURE, BLOOD (ROUTINE X 2)

## 2016-02-18 MED ORDER — POLYETHYLENE GLYCOL 3350 17 G PO PACK
17.0000 g | PACK | Freq: Two times a day (BID) | ORAL | Status: DC
  Administered 2016-02-18: 17 g via ORAL
  Filled 2016-02-18 (×2): qty 1

## 2016-02-18 MED ORDER — BACITRACIN ZINC 500 UNIT/GM EX OINT
TOPICAL_OINTMENT | Freq: Two times a day (BID) | CUTANEOUS | Status: DC
Start: 2016-02-18 — End: 2016-02-21
  Administered 2016-02-19: 10:00:00 via TOPICAL
  Administered 2016-02-19 – 2016-02-20 (×3): 16.6667 via TOPICAL
  Administered 2016-02-21: 14:00:00 via TOPICAL
  Filled 2016-02-18: qty 28.35
  Filled 2016-02-18 (×2): qty 15

## 2016-02-18 MED ORDER — POTASSIUM CHLORIDE CRYS ER 20 MEQ PO TBCR
40.0000 meq | EXTENDED_RELEASE_TABLET | Freq: Two times a day (BID) | ORAL | Status: AC
  Administered 2016-02-18 (×2): 40 meq via ORAL
  Filled 2016-02-18 (×2): qty 2

## 2016-02-18 MED ORDER — POLYETHYLENE GLYCOL 3350 17 G PO PACK: 17.0000 g | PACK | Freq: Two times a day (BID) | ORAL | Status: DC

## 2016-02-18 MED ORDER — HYDROCODONE-ACETAMINOPHEN 7.5-325 MG PO TABS: 1.0000 | ORAL_TABLET | Freq: Four times a day (QID) | ORAL | Status: DC | PRN

## 2016-02-18 MED ORDER — DIAZEPAM 2 MG PO TABS: 2.0000 mg | ORAL_TABLET | Freq: Every evening | ORAL | Status: DC | PRN

## 2016-02-18 MED ORDER — MICONAZOLE NITRATE 2 % EX CREA
TOPICAL_CREAM | CUTANEOUS | Status: DC
Start: 2016-02-18 — End: 2016-02-21
  Filled 2016-02-18: qty 14

## 2016-02-18 MED ORDER — MEROPENEM 1 G IV SOLR
1.0000 g | Freq: Two times a day (BID) | INTRAVENOUS | Status: DC
  Administered 2016-02-18 – 2016-02-20 (×6): 1 g via INTRAVENOUS
  Filled 2016-02-18 (×8): qty 1
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
## 2016-02-18 NOTE — Clinical Social Work Placement (Signed)
CSW reviewed PT evaluation recommending SNF - per RN, [name] - patient's daughter is agreeable with plan for SNF. [facility] and will follow-up with bed offers.    [name] MaxinKelly Lety Cullens, LCSW [facility] Clinical Social Worker cell #: [phone]    CLINICAL SOCIAL WORK PLACEMENT  NOTE  Date:  02/18/2016  Patient Details  Name: [name] MRN: [id number] Date of Birth: 05/08/1919  Clinical Social Work is seeking post-discharge placement for this patient at the Skilled  Nursing Facility level of care (*CSW will initial, date and re-position this form in  chart as items are completed):  Yes   Patient/family provided with [facility] Clinical Social Work Department's list of facilities offering this level of care within the geographic area requested by the patient (or if unable, by the patient's family).  Yes   Patient/family informed of their freedom to choose among providers that offer the needed level of care, that participate in Medicare, Medicaid or managed care program needed by the patient, have an available bed and are willing to accept the patient.  Yes   Patient/family informed of [facility]'s ownership interest in [facility] and [facility], as well as of the fact that they are under no obligation to receive care at these facilities.  PASRR submitted to EDS on       PASRR number received on       Existing PASRR number confirmed on 02/18/16     FL2 transmitted to all facilities in geographic area requested by pt/family on 02/18/16     FL2 transmitted to all facilities within larger geographic area on       Patient informed that his/her managed care company has contracts with or will negotiate with certain facilities, including the following:            Patient/family informed of bed offers received.  Patient chooses bed at       Physician recommends and patient chooses bed at      Patient to be transferred to    on  .  Patient to be transferred to facility by       Patient family notified on   of transfer.  Name of family member notified:        PHYSICIAN       Additional Comment:    _______________________________________________ [name], [name] Tomaso F, LCSW 02/18/2016, 2:48 PM

## 2016-02-18 NOTE — Evaluation (Signed)
Physical Therapy Evaluation Patient Details Name: [name] Swing MRN: [id number] DOB: 02/06/1919 Today's Date: 02/18/2016   History of Present Illness  [name] [place] is a 83 y.o. female with medical history significant of complete heart block, pacemaker placement, syncope, peripheral neuropathy, depression, dementia who [facility]  Clinical Impression  Pt admitted with above diagnosis. Pt currently with functional limitations due to the deficits listed below (see PT Problem List).  Pt will benefit from skilled PT to increase their independence and safety with mobility to allow discharge to the venue listed below.   Pt is extremely deconditioned, she is limited with mobility at baseline although much weaker at this time d/t course of current illness; recommend SNF post acute; will continue to follow in acute setting    Follow Up Recommendations SNF;Supervision/Assistance - 24 hour    Equipment Recommendations  None recommended by PT    Recommendations for Other Services       Precautions / Restrictions Precautions Precautions: Fall Restrictions Weight Bearing Restrictions: No      Mobility  Bed Mobility Overal bed mobility: Needs Assistance Bed Mobility: Rolling Rolling: Max assist;Total assist         General bed mobility comments: attempted, pt able to give minimal effort and also incontinent of stool--therfore further mobility deferred at this time  Transfers                 General transfer comment: NT  Ambulation/Gait             General Gait Details: non-amb at baseline  Stairs            Wheelchair Mobility    Modified Rankin (Stroke Patients Only)       Balance Overall balance assessment: History of Falls (hx of falls per chart)                                           Pertinent Vitals/Pain Pain Assessment: Faces Faces Pain Scale: Hurts little more Pain Location: rectal  Pain Descriptors /  Indicators: Constant Pain Intervention(s): Monitored during session    Home Living Family/patient expects to be discharged to:: Skilled nursing facility                      Prior Function Level of Independence: Needs assistance   Gait / Transfers Assistance Needed: bed mobility mod I per pt report; transfers bed<>w/c<>toilet with min/guard   ADL's / Homemaking Assistance Needed: pt reports assist with bathing and IND dressing  Comments: unsure of accuracy of above-baseline dementia, no family present; spoke with RN and per her discussion with pt's dtr--pt is "w/c bound"     Hand Dominance        Extremity/Trunk Assessment   Upper Extremity Assessment Upper Extremity Assessment: RUE deficits/detail;LUE deficits/detail RUE Deficits / Details: AAROM shoulder flexion 1/5, flexion to ~70*; elbow flexion/extension 3/5 LUE Deficits / Details: grossly same as above    Lower Extremity Assessment Lower Extremity Assessment: LLE deficits/detail;RLE deficits/detail RLE Deficits / Details: knee extension 2+/5, hip flexion 2 to 2-/5; hip abd/adduction 2+/5; c/o pain with ankle ROM--limited active motion bil LLE Deficits / Details: grossly same as above       Communication      Cognition Arousal/Alertness: Awake/alert Behavior During Therapy: WFL for tasks assessed/performed;Flat affect Overall Cognitive Status: History of cognitive impairments - at  baseline Area of Impairment: Memory;Problem solving;Following commands     Memory: Decreased short-term memory Following Commands: Follows one step commands with increased time     Problem Solving: Slow processing;Decreased initiation;Requires verbal cues;Requires tactile cues      General Comments      Exercises     Assessment/Plan    PT Assessment Patient needs continued PT services  PT Problem List Decreased strength;Decreased range of motion;Decreased activity tolerance;Decreased cognition;Decreased  mobility;Cardiopulmonary status limiting activity          PT Treatment Interventions DME instruction;Functional mobility training;Therapeutic exercise;Therapeutic activities;Patient/family education    PT Goals (Current goals can be found in the Care Plan section)  Acute Rehab PT Goals Patient Stated Goal: none stated PT Goal Formulation: Patient unable to participate in goal setting Time For Goal Achievement: 03/03/16 Potential to Achieve Goals: Poor    Frequency Min 2X/week   Barriers to discharge        Co-evaluation               End of Session   Activity Tolerance: Other (comment) (incontinent of stool, incr WOB with extremity movement) Patient left: in bed;with call bell/phone within reach;with bed alarm set Nurse Communication: Mobility status         Time: 4098-1191 PT Time Calculation (min) (ACUTE ONLY): 15 min   Charges:   PT Evaluation $PT Eval Moderate Complexity: 1 Procedure     PT G Codes:        Hilario Robarts Feb 28, 2016, 1:54 PM

## 2016-02-18 NOTE — Progress Notes (Signed)
Pt with multiple BMs throughout the past 12 hrs, including an episode with 2 moderately-sized, formed stool. Pt didn't have a period of greater than 3.5 hrs without a BM and therefore the SMOG enema was not administered per the verbal order given by Dr. Butler Denmarkizwan.

## 2016-02-18 NOTE — Progress Notes (Signed)
[facility] PROGRESS NOTE    Progress Note  [name]  ZOX:[id number] DOB: 1919/05/01 DOA: 02/15/2016 PCP: [name], MD     Brief Narrative:   [name] is an 83 y.o. female with past medical history of complete heart block pacemaker placement syncopal repeat with peripheral neuropathy comes in for lethargy emesis and fever 103. She was started empirically on IV antibiotics in the hospital for severe sepsis due to Escherichia coli bacteremia probably urinary source. Her blood pressure dropped into the 60s but she was fluid resuscitated.  Assessment/Plan:   Sepsis secondary to UTI Escherichia coli bacteremia: She has been afebrile, her leukocytosis is improving. Blood cultures positive for Escherichia coli. Continue IV Rocephin.  Acute encephalopathy: CT is unrevealing likely due to infectious etiology.  Severe constipation: She was given mag citrate and tap water enema will start her on MiraLAX by mouth twice a day for 4 doses. She had multiple bowel movements overnight.  Elevated troponins: Likely due to sepsis, see echo showed Takatsubo's physiology. She's not a candidate for cardiac cath discussed in detail with daughter. Continue low-dose oral metoprolol and a baby aspirin. Appreciate cardiology's assistance.  Acute kidney injury: With a baseline creatinine of less than 1 improving with IV hydration. Likely prerenal etiology.  Paroxysmal atrial fibrillation (HCC)/s/p postmaker: CHADs VASc=4 but not anticoagulated in the past secondary to a history of falls.   Chronic diastolic heart failure: On diuretics at home will continue to hold, due to her acute kidney injury. Estimated dry weight is around 99 kg today is 105 kg we'll have to start diuresing once patient more stable and creatinine returned to baseline.  Peripheral neuropathy (HCC):  DVT prophylaxis: lovenox Family Communication:none Disposition Plan/Barrier to D/C: transfer to telemetry Code  Status:     Code Status Orders        Start     Ordered   02/16/16 0858  Do not attempt resuscitation (DNR)  Continuous    Question Answer Comment  In the event of cardiac or respiratory ARREST Do not call a "code blue"   In the event of cardiac or respiratory ARREST Do not perform Intubation, CPR, defibrillation or ACLS   In the event of cardiac or respiratory ARREST Use medication by any route, position, wound care, and other measures to relive pain and suffering. May use oxygen, suction and manual treatment of airway obstruction as needed for comfort.      02/16/16 0857    Code Status History    Date Active Date Inactive Code Status Order ID Comments User Context   02/15/2016 10:16 PM 02/16/2016  8:57 AM Full Code [id number]  Bobette Mo, MD Inpatient   05/24/2014  5:18 AM 06/01/2014  6:05 PM Full Code [id number]  Ron Parker, MD Inpatient   08/02/2011 11:16 PM 08/07/2011  6:03 PM Full Code [id number]  Rica Mast, RN ED    Advance Directive Documentation   Flowsheet Row Most Recent Value  Type of Advance Directive  Healthcare Power of Attorney  Pre-existing out of facility DNR order (yellow form or pink MOST form)  No data  "MOST" Form in Place?  No data        IV Access:    Peripheral IV   Procedures and diagnostic studies:   No results found.   Medical Consultants:    None.  Anti-Infectives:   IV Zosyn.  Subjective:    [name] she is significantly first evening she relates she is not allow to  eat.. She relates she feels unchanged compared to yesterday.  Objective:    Vitals:   02/18/16 0330 02/18/16 0339 02/18/16 0500 02/18/16 0600  BP: (!) 149/86   127/69  Pulse: 60   60  Resp: 18   17  Temp:  97.9 F (36.6 C)    TempSrc:  Axillary    SpO2: 95%   98%  Weight:   105.1 kg (231 lb 11.3 oz)   Height:        Intake/Output Summary (Last 24 hours) at 02/18/16 0739 Last data filed at 02/18/16 0650  Gross per 24 hour  Intake            2582.5 ml  Output              625 ml  Net           1957.5 ml   Filed Weights   02/15/16 2215 02/17/16 1000 02/18/16 0500  Weight: 101.3 kg (223 lb 5.2 oz) 108.3 kg (238 lb 12.1 oz) 105.1 kg (231 lb 11.3 oz)    Exam: General exam: In no acute distress, obese Respiratory system: Good air movement and clear to auscultation. Cardiovascular system: S1 & S2 heard, RRR. No JVD. Gastrointestinal system: Abdomen is nondistended, soft and nontender.  Extremities: No pedal edema. Skin: No rashes, lesions or ulcers Psychiatry:slightly confused.   Data Reviewed:    Labs: Basic Metabolic Panel:  Recent Labs Lab 02/15/16 1555 02/15/16 2032 02/16/16 0327 02/17/16 0338 02/18/16 0338  NA 139  --  140 137 137  K 4.3  --  4.1 3.8 3.4*  CL 101  --  109 106 106  CO2 25  --  23 21* 20*  GLUCOSE 154*  --  134* 130* 120*  BUN 26*  --  34* 35* 32*  CREATININE 1.52*  --  1.55* 1.28* 1.02*  CALCIUM 9.5  --  8.1* 8.3* 8.0*  MG  --  1.8  --   --   --   PHOS  --  2.7  --   --   --    GFR Estimated Creatinine Clearance: 41.3 mL/min (by C-G formula based on SCr of 1.02 mg/dL (H)). Liver Function Tests:  Recent Labs Lab 02/15/16 1555 02/16/16 0327  AST 50* 33  ALT 21 17  ALKPHOS 77 52  BILITOT 0.9 0.7  PROT 8.3* 6.5  ALBUMIN 3.6 2.7*   No results for input(s): LIPASE, AMYLASE in the last 168 hours. No results for input(s): AMMONIA in the last 168 hours. Coagulation profile  Recent Labs Lab 02/15/16 1555  INR 1.08    CBC:  Recent Labs Lab 02/15/16 1555 02/16/16 0327 02/17/16 0338 02/18/16 0338  WBC 18.3* 33.2* 32.3* 27.2*  NEUTROABS 17.4* 29.6* 28.2* 24.2*  HGB 13.1 10.5* 11.6* 12.0  HCT 40.3 32.5* 35.5* 36.1  MCV 93.7 95.3 94.2 92.3  PLT 303 260 277 244   Cardiac Enzymes:  Recent Labs Lab 02/15/16 2032 02/16/16 0327 02/16/16 1525  TROPONINI 1.30* 1.26* 1.33*   BNP (last 3 results) No results for input(s): PROBNP in the last 8760  hours. CBG:  Recent Labs Lab 02/15/16 1550  GLUCAP 149*   D-Dimer: No results for input(s): DDIMER in the last 72 hours. Hgb A1c: No results for input(s): HGBA1C in the last 72 hours. Lipid Profile: No results for input(s): CHOL, HDL, LDLCALC, TRIG, CHOLHDL, LDLDIRECT in the last 72 hours. Thyroid function studies: No results for input(s): TSH, T4TOTAL, T3FREE, THYROIDAB in the last  72 hours.  Invalid input(s): FREET3 Anemia work up: No results for input(s): VITAMINB12, FOLATE, FERRITIN, TIBC, IRON, RETICCTPCT in the last 72 hours. Sepsis Labs:  Recent Labs Lab 02/15/16 1555  02/15/16 2026 02/15/16 2141 02/16/16 0050 02/16/16 0327 02/17/16 0338 02/18/16 0338  WBC 18.3*  --   --   --   --  33.2* 32.3* 27.2*  LATICACIDVEN  --   < > 3.79* 2.84* 1.9 1.9  --   --   < > = values in this interval not displayed. Microbiology Recent Results (from the past 240 hour(s))  Blood Culture (routine x 2)     Status: Abnormal (Preliminary result)   Collection Time: 02/15/16  3:56 PM  Result Value Ref Range Status   Specimen Description BLOOD BLOOD LEFT FOREARM  Final   Special Requests BOTTLES DRAWN AEROBIC AND ANAEROBIC 5 CC  Final   Culture  Setup Time   Final    GRAM NEGATIVE RODS IN BOTH AEROBIC AND ANAEROBIC BOTTLES CRITICAL RESULT CALLED TO, READ BACK BY AND VERIFIED WITH: Azzie Glatter Pharm.D. 12:40 02/16/16 ([email or web address]) Performed at [facility], [address], [place], [place] [address]    Culture ESCHERICHIA COLI (A)  Final   Report Status PENDING  Incomplete  Blood Culture ID Panel (Reflexed)     Status: Abnormal   Collection Time: 02/15/16  3:56 PM  Result Value Ref Range Status   Enterococcus species NOT DETECTED NOT DETECTED Final   Listeria monocytogenes NOT DETECTED NOT DETECTED Final   Staphylococcus species NOT DETECTED NOT DETECTED Final   Staphylococcus aureus NOT DETECTED NOT DETECTED Final   Streptococcus species NOT DETECTED NOT DETECTED Final    Streptococcus agalactiae NOT DETECTED NOT DETECTED Final   Streptococcus pneumoniae NOT DETECTED NOT DETECTED Final   Streptococcus pyogenes NOT DETECTED NOT DETECTED Final   Acinetobacter baumannii NOT DETECTED NOT DETECTED Final   Enterobacteriaceae species DETECTED (A) NOT DETECTED Final    Comment: Enterobacteriaceae represent a large family of gram-negative bacteria, not a single organism. CRITICAL RESULT CALLED TO, READ BACK BY AND VERIFIED WITH: Azzie Glatter Pharm.D. 12:40 02/16/16 ([email or web address])    Enterobacter cloacae complex NOT DETECTED NOT DETECTED Final   Escherichia coli DETECTED (A) NOT DETECTED Final    Comment: CRITICAL RESULT CALLED TO, READ BACK BY AND VERIFIED WITH: Mardee Postin.D. 12:40 02/16/16 ([email or web address])    Klebsiella oxytoca NOT DETECTED NOT DETECTED Final   Klebsiella pneumoniae NOT DETECTED NOT DETECTED Final   Proteus species NOT DETECTED NOT DETECTED Final   Serratia marcescens NOT DETECTED NOT DETECTED Final   Carbapenem resistance NOT DETECTED NOT DETECTED Final   Haemophilus influenzae NOT DETECTED NOT DETECTED Final   Neisseria meningitidis NOT DETECTED NOT DETECTED Final   Pseudomonas aeruginosa NOT DETECTED NOT DETECTED Final   Candida albicans NOT DETECTED NOT DETECTED Final   Candida glabrata NOT DETECTED NOT DETECTED Final   Candida krusei NOT DETECTED NOT DETECTED Final   Candida parapsilosis NOT DETECTED NOT DETECTED Final   Candida tropicalis NOT DETECTED NOT DETECTED Final    Comment: Performed at [facility], [address] [place], [place] [address]  Urine culture     Status: Abnormal (Preliminary result)   Collection Time: 02/15/16  6:16 PM  Result Value Ref Range Status   Specimen Description URINE, CATHETERIZED  Final   Special Requests NONE  Final   Culture >=100,000 COLONIES/mL ESCHERICHIA COLI (A)  Final   Report Status PENDING  Incomplete  MRSA PCR  Screening     Status: None   Collection Time: 02/15/16 10:30 PM  Result Value Ref  Range Status   MRSA by PCR NEGATIVE NEGATIVE Final    Comment:        The GeneXpert MRSA Assay (FDA approved for NASAL specimens only), is one component of a comprehensive MRSA colonization surveillance program. It is not intended to diagnose MRSA infection nor to guide or monitor treatment for MRSA infections.      Medications:   . aspirin EC  325 mg Oral Daily  . collagenase  1 application Topical Once per day on Mon Wed Fri  . DULoxetine  60 mg Oral Daily  . enoxaparin (LOVENOX) injection  40 mg Subcutaneous Q24H  . feeding supplement (ENSURE ENLIVE)  237 mL Oral BID BM  . feeding supplement (ENSURE ENLIVE)  237 mL Oral BID BM  . gabapentin  300 mg Oral BID  . lactulose  30 g Oral BID  . mouth rinse  15 mL Mouth Rinse BID  . metoCLOPramide (REGLAN) injection  10 mg Intravenous Q6H  . metoprolol tartrate  12.5 mg Oral BID  . nystatin  1 Bottle Topical TID  . piperacillin-tazobactam (ZOSYN)  IV  3.375 g Intravenous Q8H  . senna  2 tablet Oral BID  . sodium chloride flush  3 mL Intravenous Q12H  . sorbitol, milk of mag, mineral oil, glycerin (SMOG) enema  960 mL Rectal Once   Continuous Infusions: . sodium chloride 75 mL/hr at 02/17/16 2141    Time spent: 25 min   LOS: 3 days   Marinda Elk  [facility] Pager [phone]  *Please refer to [email or web address], password [email or web address] to get updated schedule on who will round on this patient, as hospitalists switch teams weekly. If 7PM-7AM, please contact night-coverage at [email or web address], password [email or web address] for any overnight needs.  02/18/2016, 7:39 AM

## 2016-02-18 NOTE — Progress Notes (Signed)
Progress Note  Patient Name: [name] Date of Encounter: 02/18/2016  Primary Cardiologist: Dr Graciela HusbandsKlein  Subjective   Main complaint is rectal discomfort pain. Pt has been having BM's with laxatives.  Inpatient Medications    Scheduled Meds: . aspirin EC  325 mg Oral Daily  . collagenase  1 application Topical Once per day on Mon Wed Fri  . DULoxetine  60 mg Oral Daily  . enoxaparin (LOVENOX) injection  40 mg Subcutaneous Q24H  . feeding supplement (ENSURE ENLIVE)  237 mL Oral BID BM  . gabapentin  300 mg Oral BID  . lactulose  30 g Oral BID  . mouth rinse  15 mL Mouth Rinse BID  . metoCLOPramide (REGLAN) injection  10 mg Intravenous Q6H  . metoprolol tartrate  12.5 mg Oral BID  . nystatin  1 Bottle Topical TID  . piperacillin-tazobactam (ZOSYN)  IV  3.375 g Intravenous Q8H  . polyethylene glycol  17 g Oral BID  . potassium chloride  40 mEq Oral BID  . senna  2 tablet Oral BID  . sodium chloride flush  3 mL Intravenous Q12H   Continuous Infusions: . sodium chloride 10 mL/hr at 02/18/16 0749   PRN Meds: acetaminophen, albuterol, ipratropium, LORazepam, ondansetron **OR** ondansetron (ZOFRAN) IV   Vital Signs    Vitals:   02/18/16 0500 02/18/16 0600 02/18/16 0700 02/18/16 0800  BP:  127/69    Pulse:  60 60   Resp:  17 17   Temp:    98.1 F (36.7 C)  TempSrc:    Oral  SpO2:  98% 100%   Weight: 231 lb 11.3 oz (105.1 kg)     Height:        Intake/Output Summary (Last 24 hours) at 02/18/16 0841 Last data filed at 02/18/16 0650  Gross per 24 hour  Intake           2507.5 ml  Output              625 ml  Net           1882.5 ml   Filed Weights   02/15/16 2215 02/17/16 1000 02/18/16 0500  Weight: 223 lb 5.2 oz (101.3 kg) 238 lb 12.1 oz (108.3 kg) 231 lb 11.3 oz (105.1 kg)    Telemetry    V-paced at 60 bpm, occ increase to 70's-80's, with runs of NSVT - Personally Reviewed  ECG    02/15/16 V-paced  Personally Reviewed  Physical Exam   GEN: No acute  distress.   Neck: No JVD Cardiac: RRR, no murmurs, rubs, or gallops.  Respiratory: Clear to auscultation bilaterally. GI: Soft, nontender, non-distended  MS: No edema; No deformity. Skin lesion on right foot with taught edema at site Neuro:  Nonfocal  Psych: Normal affect   Labs    Chemistry  Recent Labs Lab 02/15/16 1555 02/16/16 0327 02/17/16 0338 02/18/16 0338  NA 139 140 137 137  K 4.3 4.1 3.8 3.4*  CL 101 109 106 106  CO2 25 23 21* 20*  GLUCOSE 154* 134* 130* 120*  BUN 26* 34* 35* 32*  CREATININE 1.52* 1.55* 1.28* 1.02*  CALCIUM 9.5 8.1* 8.3* 8.0*  PROT 8.3* 6.5  --   --   ALBUMIN 3.6 2.7*  --   --   AST 50* 33  --   --   ALT 21 17  --   --   ALKPHOS 77 52  --   --   BILITOT 0.9 0.7  --   --  GFRNONAA 28* 27* 35* 46*  GFRAA 33* 32* 40* 53*  ANIONGAP 13 8 10 11      Hematology  Recent Labs Lab 02/16/16 0327 02/17/16 0338 02/18/16 0338  WBC 33.2* 32.3* 27.2*  RBC 3.41* 3.77* 3.91  HGB 10.5* 11.6* 12.0  HCT 32.5* 35.5* 36.1  MCV 95.3 94.2 92.3  MCH 30.8 30.8 30.7  MCHC 32.3 32.7 33.2  RDW 16.4* 16.9* 16.6*  PLT 260 277 244    Cardiac Enzymes  Recent Labs Lab 02/15/16 2032 02/16/16 0327 02/16/16 1525  TROPONINI 1.30* 1.26* 1.33*     Recent Labs Lab 02/15/16 1608  TROPIPOC 1.26*     BNPNo results for input(s): BNP, PROBNP in the last 168 hours.   DDimer No results for input(s): DDIMER in the last 168 hours.   Radiology    No results found.  Cardiac Studies   Echo 02/16/16 Study Conclusions  - Left ventricle: The cavity size was normal. Wall thickness was   increased in a pattern of moderate LVH. Systolic function was   normal. The estimated ejection fraction was in the range of 35%   to 40%. Akinesis of the apicalanteroseptal, lateral, inferior,   and apical myocardium. Features are consistent with a   pseudonormal left ventricular filling pattern, with concomitant   abnormal relaxation and increased filling pressure (grade  2   diastolic dysfunction). - Aortic valve: Mildly to moderately calcified annulus. - Mitral valve: Mildly to moderately calcified annulus. There was   mild regurgitation. Valve area by continuity equation (using LVOT   flow): 1.67 cm^2. - Tricuspid valve: There was mild-moderate regurgitation. - Pulmonary arteries: Systolic pressure was mildly to moderately   increased. PA peak pressure: 40 mm Hg (S).   Patient Profile     83 y.o. female with a history of a pacemaker, admitted 02/15/16 from assisted living with sepsis. Her Troponin was elevated but not rising. Echo shows LVD with apical AK-EF 35-40%. [name] also appears to have a bowel obstruction.   Assessment & Plan    1. Elevated Troponin- Flat trend.  No plan for ischemic workup  2. Cardiomyopathy -Echo c/w Takostubo CM. EF 35-40% with apical akinesis -Creatinine was 1.52 on admission likely related to sepsis, AKI has resolved with gentle hydration. Continuing to monitor with attempts to resolved stool impaction, kidney function my decline with enemas and laxatives.  -No diuretic for now. Appears euvolemic. I&O is positive 1957 for 24h, +13L since admission. IV fluids have been reduced to [facility] now. -Tolerating low dose beta blocker.  -No ACE or ARB with acute illness and acute renal injury.  3. Pacemaker- Pt has MDT pacemaker- EOL 20 months. Underlying AF. CHADs VASc=4 but not anticoagulated in the past secondary to a history of falls. Will discuss addition of anticoagulant, DOAC once sepsis and bowel impaction resolving.   4. Sepsis- Per primary team- WBC up to 32k today. E. Coli bacteremia secondary to UTI  5. Bowel obstruction- CT- severe stoll impaction without obstruction. IM working to clear. Having BM's with laxative, enemas not needed. Taking po water and Ensure. No vomiting.   SignedBerton Bon, NP  02/18/2016, 8:41 AM   Pager [phone]   History and all data above reviewed.  Patient examined.  I agree with the  findings as above.  [name] has continued confusion and rectal pain.  No SOB.  No chest pain.  The patient exam reveals COR:RRR  ,  Lungs: Clear ,  Abd: distended but less so, Ext Mild/mod edema  diffusely.   .  All available labs, radiology testing, previous records reviewed. Agree with documented assessment and plan. Cardiomyopathy:  Conservative medical management as above.  Continue low dose beta blocker.   Fayrene Fearing [facility]  10:23 AM  02/18/2016

## 2016-02-18 NOTE — Progress Notes (Signed)
Patient transferred from ICU. Agree with previous RN assessment. Will continue to monitor.

## 2016-02-19 MED ORDER — FUROSEMIDE 10 MG/ML IJ SOLN
40.0000 mg | Freq: Two times a day (BID) | INTRAMUSCULAR | Status: DC
  Administered 2016-02-19 – 2016-02-20 (×4): 40 mg via INTRAVENOUS
  Filled 2016-02-19 (×4): qty 4

## 2016-02-19 MED ORDER — POLYETHYLENE GLYCOL 3350 17 G PO PACK
17.0000 g | PACK | Freq: Every day | ORAL | Status: DC
  Administered 2016-02-19 – 2016-02-21 (×3): 17 g via ORAL
  Filled 2016-02-19 (×3): qty 1

## 2016-02-19 MED ORDER — POTASSIUM CHLORIDE CRYS ER 20 MEQ PO TBCR
40.0000 meq | EXTENDED_RELEASE_TABLET | Freq: Three times a day (TID) | ORAL | Status: AC
  Administered 2016-02-19 (×3): 40 meq via ORAL
  Filled 2016-02-19 (×3): qty 2

## 2016-02-19 NOTE — Clinical Social Work Note (Signed)
Out of Facility DNR form placed on chart for MD's signature.  Lorri Frederickonna T. Jaci LazierCrowder, [place]CS[place] [phone] (weekend coverage)

## 2016-02-19 NOTE — Clinical Social Work Note (Signed)
Per discussion with Dr. Aileen Fass- patient will require a PICC insertion on Monday and then he anticipates stability for d/.  CSW met with patient's daughter Liara Holm ([phone] today and discussed current bed offers. She requested additional follow up with [facility] (who had declined but will relook at referral) and [facility].  [facility] cannot offer as they do not have a private room nor any long term care bed.  [facility] has made offer. Daughter will tour [facility] today and [facility] tomorrow at 3 pm.  Daughter indicates that she is doubtful her mother will be able to return to the ALF at [facility] due to her mother's deteriorated condition. Discussed need to apply for long term care Medicaid one patient is placed in SNF and she verbalized understanding of this.  Daughter will call weekend SW tomorrow with bed choice. SNF referral re-sent to [facility] for review.  Lorie Phenix. Pauline Good, [place] (weekend coverage)

## 2016-02-19 NOTE — Progress Notes (Signed)
[facility] PROGRESS NOTE    Progress Note  [name] Vasko  ZOX:[id number] DOB: 01/26/1919 DOA: 02/15/2016 PCP: Florentina JennyRIPP, HENRY, MD     Brief Narrative:   [name] Bulthuis is an 83 y.o. female with past medical history of complete heart block pacemaker placement syncopal repeat with peripheral neuropathy comes in for lethargy emesis and fever 103. She was started empirically on IV antibiotics in the hospital for severe sepsis due to Escherichia coli bacteremia probably urinary source. Her blood pressure dropped into the 60s but she was fluid resuscitated.  Assessment/Plan:   Sepsis secondary to UTI /ESBL bacteremia: She has been afebrile, her leukocytosis is improving. Blood cultures positive for ESBL, started on meropenem. Repeat BC today. Insert Picc line on 2.5.2014  Acute encephalopathy: CT is unrevealing likely due to infectious etiology.  Severe constipation: She was given Mag citrate and tap water enema and MiraLAX by mouth twice. Now resolved, will cont regimen.  Elevated troponins: Likely due to sepsis, see echo showed Takatsubo's physiology. She's not a candidate for cardiac cath discussed in detail with daughter. Continue low-dose oral metoprolol and a baby aspirin. Appreciate cardiology's assistance.  Acute kidney injury: With a baseline creatinine of less than 1 improving with IV hydration. Likely prerenal etiology.  Paroxysmal atrial fibrillation (HCC)/s/p postmaker: CHADs VASc=4 but not anticoagulated in the past secondary to a history of falls.   Chronic diastolic heart failure: On 1.31.2018 Echo showed EF of 40 %, grade 1 diastolic HF. Estimated dry weight is around 99 kg today is 107 kg, I will start IV lasix, monitor electrolytes. Seem vol overloaded on physical exam.  Peripheral neuropathy (HCC): Con neurontin.  DVT prophylaxis: lovenox Family Communication:none Disposition Plan/Barrier to D/C: transfer to telemetry Code Status:     Code Status  Orders        Start     Ordered   02/16/16 0858  Do not attempt resuscitation (DNR)  Continuous    Question Answer Comment  In the event of cardiac or respiratory ARREST Do not call a "code blue"   In the event of cardiac or respiratory ARREST Do not perform Intubation, CPR, defibrillation or ACLS   In the event of cardiac or respiratory ARREST Use medication by any route, position, wound care, and other measures to relive pain and suffering. May use oxygen, suction and manual treatment of airway obstruction as needed for comfort.      02/16/16 0857    Code Status History    Date Active Date Inactive Code Status Order ID Comments User Context   02/15/2016 10:16 PM 02/16/2016  8:57 AM Full Code [id number]  Bobette Moavid Manuel Ortiz, MD Inpatient   05/24/2014  5:18 AM 06/01/2014  6:05 PM Full Code [id number]  Ron ParkerHarvette C Jenkins, MD Inpatient   08/02/2011 11:16 PM 08/07/2011  6:03 PM Full Code [id number]  Rica MastNsumanganyi C Kalombo, RN ED    Advance Directive Documentation   Flowsheet Row Most Recent Value  Type of Advance Directive  Healthcare Power of Attorney  Pre-existing out of facility DNR order (yellow form or pink MOST form)  No data  "MOST" Form in Place?  No data        IV Access:    Peripheral IV   Procedures and diagnostic studies:   No results found.   Medical Consultants:    None.  Anti-Infectives:   IV Zosyn.  Subjective:    [name] Hannold she is significantly first evening she relates she is not allow to eat.. She relates she  feels unchanged compared to yesterday.  Objective:    Vitals:   02/18/16 2046 02/18/16 2116 02/19/16 0556 02/19/16 0600  BP: (!) 155/74  (!) 164/79 (!) 147/78  Pulse: (!) 57 60 (!) 59   Resp: 20  20   Temp: 97.8 F (36.6 C)  97.7 F (36.5 C)   TempSrc: Axillary  Oral   SpO2: 97%  100%   Weight:   107.8 kg (237 lb 10.5 oz)   Height:        Intake/Output Summary (Last 24 hours) at 02/19/16 0837 Last data filed at 02/19/16 0300  Gross  per 24 hour  Intake          1690.58 ml  Output              250 ml  Net          1440.58 ml   Filed Weights   02/17/16 1000 02/18/16 0500 02/19/16 0556  Weight: 108.3 kg (238 lb 12.1 oz) 105.1 kg (231 lb 11.3 oz) 107.8 kg (237 lb 10.5 oz)    Exam: General exam: In no acute distress, obese Respiratory system: Good air movement and clear to auscultation. Cardiovascular system: S1 & S2 heard, RRR. + JVD. Gastrointestinal system: Abdomen is nondistended, soft and nontender.  Extremities: No pedal edema. Skin: No rashes, lesions or ulcers Psychiatry:slightly confused.   Data Reviewed:    Labs: Basic Metabolic Panel:  Recent Labs Lab 02/15/16 1555 02/15/16 2032 02/16/16 0327 02/17/16 0338 02/18/16 0338  NA 139  --  140 137 137  K 4.3  --  4.1 3.8 3.4*  CL 101  --  109 106 106  CO2 25  --  23 21* 20*  GLUCOSE 154*  --  134* 130* 120*  BUN 26*  --  34* 35* 32*  CREATININE 1.52*  --  1.55* 1.28* 1.02*  CALCIUM 9.5  --  8.1* 8.3* 8.0*  MG  --  1.8  --   --   --   PHOS  --  2.7  --   --   --    GFR Estimated Creatinine Clearance: 41.9 mL/min (by C-G formula based on SCr of 1.02 mg/dL (H)). Liver Function Tests:  Recent Labs Lab 02/15/16 1555 02/16/16 0327  AST 50* 33  ALT 21 17  ALKPHOS 77 52  BILITOT 0.9 0.7  PROT 8.3* 6.5  ALBUMIN 3.6 2.7*   No results for input(s): LIPASE, AMYLASE in the last 168 hours. No results for input(s): AMMONIA in the last 168 hours. Coagulation profile  Recent Labs Lab 02/15/16 1555  INR 1.08    CBC:  Recent Labs Lab 02/15/16 1555 02/16/16 0327 02/17/16 0338 02/18/16 0338  WBC 18.3* 33.2* 32.3* 27.2*  NEUTROABS 17.4* 29.6* 28.2* 24.2*  HGB 13.1 10.5* 11.6* 12.0  HCT 40.3 32.5* 35.5* 36.1  MCV 93.7 95.3 94.2 92.3  PLT 303 260 277 244   Cardiac Enzymes:  Recent Labs Lab 02/15/16 2032 02/16/16 0327 02/16/16 1525  TROPONINI 1.30* 1.26* 1.33*   BNP (last 3 results) No results for input(s): PROBNP in the last  8760 hours. CBG:  Recent Labs Lab 02/15/16 1550  GLUCAP 149*   D-Dimer: No results for input(s): DDIMER in the last 72 hours. Hgb A1c: No results for input(s): HGBA1C in the last 72 hours. Lipid Profile: No results for input(s): CHOL, HDL, LDLCALC, TRIG, CHOLHDL, LDLDIRECT in the last 72 hours. Thyroid function studies: No results for input(s): TSH, T4TOTAL, T3FREE, THYROIDAB in the  last 72 hours.  Invalid input(s): FREET3 Anemia work up: No results for input(s): VITAMINB12, FOLATE, FERRITIN, TIBC, IRON, RETICCTPCT in the last 72 hours. Sepsis Labs:  Recent Labs Lab 02/15/16 1555  02/15/16 2026 02/15/16 2141 02/16/16 0050 02/16/16 0327 02/17/16 0338 02/18/16 0338  WBC 18.3*  --   --   --   --  33.2* 32.3* 27.2*  LATICACIDVEN  --   < > 3.79* 2.84* 1.9 1.9  --   --   < > = values in this interval not displayed. Microbiology Recent Results (from the past 240 hour(s))  Blood Culture (routine x 2)     Status: Abnormal   Collection Time: 02/15/16  3:56 PM  Result Value Ref Range Status   Specimen Description BLOOD BLOOD LEFT FOREARM  Final   Special Requests BOTTLES DRAWN AEROBIC AND ANAEROBIC 5 CC  Final   Culture  Setup Time   Final    GRAM NEGATIVE RODS IN BOTH AEROBIC AND ANAEROBIC BOTTLES CRITICAL RESULT CALLED TO, READ BACK BY AND VERIFIED WITH: Azzie Glatter Pharm.D. 12:40 02/16/16 ([email or web address])    Culture (A)  Final    ESCHERICHIA COLI Confirmed Extended Spectrum Beta-Lactamase Producer (ESBL) Performed at [facility], [address] [place], [place] [address]    Report Status 02/18/2016 FINAL  Final   Organism ID, Bacteria ESCHERICHIA COLI  Final      Susceptibility   Escherichia coli - MIC*    AMPICILLIN >=32 RESISTANT Resistant     CEFAZOLIN >=64 RESISTANT Resistant     CEFEPIME >=64 RESISTANT Resistant     CEFTAZIDIME RESISTANT Resistant     CEFTRIAXONE >=64 RESISTANT Resistant     CIPROFLOXACIN >=4 RESISTANT Resistant     GENTAMICIN <=1 SENSITIVE  Sensitive     IMIPENEM <=0.25 SENSITIVE Sensitive     TRIMETH/SULFA <=20 SENSITIVE Sensitive     AMPICILLIN/SULBACTAM >=32 RESISTANT Resistant     PIP/TAZO <=4 SENSITIVE Sensitive     Extended ESBL POSITIVE Resistant     * ESCHERICHIA COLI  Blood Culture ID Panel (Reflexed)     Status: Abnormal   Collection Time: 02/15/16  3:56 PM  Result Value Ref Range Status   Enterococcus species NOT DETECTED NOT DETECTED Final   Listeria monocytogenes NOT DETECTED NOT DETECTED Final   Staphylococcus species NOT DETECTED NOT DETECTED Final   Staphylococcus aureus NOT DETECTED NOT DETECTED Final   Streptococcus species NOT DETECTED NOT DETECTED Final   Streptococcus agalactiae NOT DETECTED NOT DETECTED Final   Streptococcus pneumoniae NOT DETECTED NOT DETECTED Final   Streptococcus pyogenes NOT DETECTED NOT DETECTED Final   Acinetobacter baumannii NOT DETECTED NOT DETECTED Final   Enterobacteriaceae species DETECTED (A) NOT DETECTED Final    Comment: Enterobacteriaceae represent a large family of gram-negative bacteria, not a single organism. CRITICAL RESULT CALLED TO, READ BACK BY AND VERIFIED WITH: Azzie Glatter Pharm.D. 12:40 02/16/16 ([email or web address])    Enterobacter cloacae complex NOT DETECTED NOT DETECTED Final   Escherichia coli DETECTED (A) NOT DETECTED Final    Comment: CRITICAL RESULT CALLED TO, READ BACK BY AND VERIFIED WITH: Mardee Postin.D. 12:40 02/16/16 ([email or web address])    Klebsiella oxytoca NOT DETECTED NOT DETECTED Final   Klebsiella pneumoniae NOT DETECTED NOT DETECTED Final   Proteus species NOT DETECTED NOT DETECTED Final   Serratia marcescens NOT DETECTED NOT DETECTED Final   Carbapenem resistance NOT DETECTED NOT DETECTED Final   Haemophilus influenzae NOT DETECTED NOT DETECTED Final   Neisseria meningitidis NOT DETECTED NOT DETECTED  Final   Pseudomonas aeruginosa NOT DETECTED NOT DETECTED Final   Candida albicans NOT DETECTED NOT DETECTED Final   Candida glabrata NOT DETECTED NOT DETECTED  Final   Candida krusei NOT DETECTED NOT DETECTED Final   Candida parapsilosis NOT DETECTED NOT DETECTED Final   Candida tropicalis NOT DETECTED NOT DETECTED Final    Comment: Performed at [facility], [address] [place], [place] [address]  Urine culture     Status: Abnormal   Collection Time: 02/15/16  6:16 PM  Result Value Ref Range Status   Specimen Description URINE, CATHETERIZED  Final   Special Requests NONE  Final   Culture (A)  Final    >=100,000 COLONIES/mL ESCHERICHIA COLI Confirmed Extended Spectrum Beta-Lactamase Producer (ESBL) Performed at [facility], [address], [place], [place] [address]    Report Status 02/18/2016 FINAL  Final   Organism ID, Bacteria ESCHERICHIA COLI (A)  Final      Susceptibility   Escherichia coli - MIC*    AMPICILLIN >=32 RESISTANT Resistant     CEFAZOLIN >=64 RESISTANT Resistant     CEFTRIAXONE >=64 RESISTANT Resistant     CIPROFLOXACIN >=4 RESISTANT Resistant     GENTAMICIN >=16 RESISTANT Resistant     IMIPENEM <=0.25 SENSITIVE Sensitive     NITROFURANTOIN <=16 SENSITIVE Sensitive     TRIMETH/SULFA <=20 SENSITIVE Sensitive     AMPICILLIN/SULBACTAM >=32 RESISTANT Resistant     PIP/TAZO 64 INTERMEDIATE Intermediate     Extended ESBL POSITIVE Resistant     * >=100,000 COLONIES/mL ESCHERICHIA COLI  MRSA PCR Screening     Status: None   Collection Time: 02/15/16 10:30 PM  Result Value Ref Range Status   MRSA by PCR NEGATIVE NEGATIVE Final    Comment:        The GeneXpert MRSA Assay (FDA approved for NASAL specimens only), is one component of a comprehensive MRSA colonization surveillance program. It is not intended to diagnose MRSA infection nor to guide or monitor treatment for MRSA infections.      Medications:   . aspirin EC  325 mg Oral Daily  . bacitracin   Topical BID  . collagenase  1 application Topical Once per day on Mon Wed Fri  . DULoxetine  60 mg Oral Daily  . enoxaparin (LOVENOX) injection   40 mg Subcutaneous Q24H  . feeding supplement (ENSURE ENLIVE)  237 mL Oral BID BM  . gabapentin  300 mg Oral BID  . lactulose  30 g Oral BID  . mouth rinse  15 mL Mouth Rinse BID  . meropenem (MERREM) IV  1 g Intravenous Q12H  . metoCLOPramide (REGLAN) injection  10 mg Intravenous Q6H  . metoprolol tartrate  12.5 mg Oral BID  . miconazole   Topical QODAY  . nystatin  1 Bottle Topical TID  . polyethylene glycol  17 g Oral BID  . senna  2 tablet Oral BID  . sodium chloride flush  3 mL Intravenous Q12H   Continuous Infusions: . sodium chloride Stopped (02/18/16 1530)    Time spent: 25 min   LOS: 4 days   Marinda Elk  [facility] Pager [phone]  *Please refer to [email or web address], password [email or web address] to get updated schedule on who will round on this patient, as hospitalists switch teams weekly. If 7PM-7AM, please contact night-coverage at [email or web address], password [email or web address] for any overnight needs.  02/19/2016, 8:37 AM

## 2016-02-20 DIAGNOSIS — E872 Acidosis: Secondary | ICD-10-CM

## 2016-02-20 LAB — BASIC METABOLIC PANEL
ANION GAP: 11 (ref 5–15)
BUN: 36 mg/dL — ABNORMAL HIGH (ref 6–20)
CHLORIDE: 102 mmol/L (ref 101–111)
CO2: 23 mmol/L (ref 22–32)
Calcium: 8.3 mg/dL — ABNORMAL LOW (ref 8.9–10.3)
Creatinine, Ser: 1.08 mg/dL — ABNORMAL HIGH (ref 0.44–1.00)
GFR, EST AFRICAN AMERICAN: 49 mL/min — AB (ref >60)
GFR, EST NON AFRICAN AMERICAN: 43 mL/min — AB (ref >60)
Glucose, Bld: 165 mg/dL — ABNORMAL HIGH (ref 65–99)
POTASSIUM: 3.4 mmol/L — AB (ref 3.5–5.1)
SODIUM: 136 mmol/L (ref 135–145)

## 2016-02-20 LAB — CBC
HEMATOCRIT: 36.7 % (ref 36.0–46.0)
HEMOGLOBIN: 12.3 g/dL (ref 12.0–15.0)
MCH: 29.1 pg (ref 26.0–34.0)
MCHC: 33.5 g/dL (ref 30.0–36.0)
MCV: 86.8 fL (ref 78.0–100.0)
Platelets: 279 10*3/uL (ref 150–400)
RBC: 4.23 MIL/uL (ref 3.87–5.11)
RDW: 15.9 % — AB (ref 11.5–15.5)
WBC: 18.2 10*3/uL — AB (ref 4.0–10.5)

## 2016-02-20 MED ORDER — POTASSIUM CHLORIDE CRYS ER 20 MEQ PO TBCR
40.0000 meq | EXTENDED_RELEASE_TABLET | Freq: Three times a day (TID) | ORAL | Status: AC
  Administered 2016-02-20 (×3): 40 meq via ORAL
  Filled 2016-02-20 (×3): qty 2

## 2016-02-20 NOTE — Progress Notes (Signed)
[facility] PROGRESS NOTE    Progress Note  [name]  WJX:[id number] DOB: 11/28/1919 DOA: 02/15/2016 PCP: [name], MD     Brief Narrative:   [name] is an 83 y.o. female with past medical history of complete heart block pacemaker placement syncopal repeat with peripheral neuropathy comes in for lethargy emesis and fever 103. She was started empirically on IV antibiotics in the hospital for severe sepsis due to Escherichia coli bacteremia probably urinary source. Her blood pressure dropped into the 60s but she was fluid resuscitated.  Assessment/Plan:   Sepsis secondary to UTI /ESBL bacteremia: Blood cultures positive for ESBL, started on meropenem. Repeated BC negative till date Insert Picc line on 2.5.2014. Home on 2.5.2018  Acute encephalopathy: CT is unrevealing likely due to infectious etiology.  Severe constipation: Now resolved, will cont regimen Miralax.  Elevated troponins: Likely due to sepsis, see echo showed Takatsubo's physiology. Continue low-dose oral metoprolol and a baby aspirin. Appreciate cardiology's assistance.  Acute kidney injury: With a baseline creatinine of less than 1, resolved with IV hydration. Likely prerenal etiology.  Paroxysmal atrial fibrillation (HCC)/s/p postmaker: CHADs VASc=4 but not anticoagulated in the past secondary to a history of falls.   Chronic diastolic heart failure: On 1.31.2018 Echo showed EF of 40 %, grade 1 diastolic HF. Estimated dry weight is around 99 kg today is 107 kg, cont IV lasix, weight is improving,monitor electrolytes. Cont to be vol overloaded on physical exam. Strict I and O's.  Peripheral neuropathy (HCC): Resume neurontin.  DVT prophylaxis: lovenox Family Communication:none Disposition Plan/Barrier to D/C: home hopefully in am Code Status:     Code Status Orders        Start     Ordered   02/16/16 [phone]  Do not attempt resuscitation (DNR)  Continuous    Question Answer  Comment  In the event of cardiac or respiratory ARREST Do not call a "code blue"   In the event of cardiac or respiratory ARREST Do not perform Intubation, CPR, defibrillation or ACLS   In the event of cardiac or respiratory ARREST Use medication by any route, position, wound care, and other measures to relive pain and suffering. May use oxygen, suction and manual treatment of airway obstruction as needed for comfort.      02/16/16 0857    Code Status History    Date Active Date Inactive Code Status Order ID Comments User Context   02/15/2016 10:16 PM 02/16/2016  8:57 AM Full Code [id number]  Bobette Mo, MD Inpatient   05/24/2014  5:18 AM 06/01/2014  6:05 PM Full Code [id number]  Ron Parker, MD Inpatient   08/02/2011 11:16 PM 08/07/2011  6:03 PM Full Code [id number]  Rica Mast, RN ED    Advance Directive Documentation   Flowsheet Row Most Recent Value  Type of Advance Directive  Healthcare Power of Attorney  Pre-existing out of facility DNR order (yellow form or pink MOST form)  No data  "MOST" Form in Place?  No data        IV Access:    Peripheral IV   Procedures and diagnostic studies:   No results found.   Medical Consultants:    None.  Anti-Infectives:   IV Zosyn.  Subjective:    [name]  She relates she feels unchanged compared to yesterday.  Objective:    Vitals:   02/19/16 0600 02/19/16 1429 02/19/16 2103 02/20/16 0429  BP: (!) 147/78 136/63 (!) 146/69 134/66  Pulse:  Marland Kitchen)  57 (!) 59 60  Resp:  (!) 22 20 16   Temp:  98.7 F (37.1 C) 98.3 F (36.8 C) 98.7 F (37.1 C)  TempSrc:  Axillary Axillary Oral  SpO2:  100% 97% 99%  Weight:    103.8 kg (228 lb 13.4 oz)  Height:        Intake/Output Summary (Last 24 hours) at 02/20/16 0849 Last data filed at 02/20/16 0200  Gross per 24 hour  Intake              560 ml  Output                0 ml  Net              560 ml   Filed Weights   02/18/16 0500 02/19/16 0556 02/20/16 0429   Weight: 105.1 kg (231 lb 11.3 oz) 107.8 kg (237 lb 10.5 oz) 103.8 kg (228 lb 13.4 oz)    Exam: General exam: In no acute distress, obese Respiratory system: Good air movement and clear to auscultation. Cardiovascular system: S1 & S2 heard, RRR. + JVD. Gastrointestinal system: Abdomen is nondistended, soft and nontender.  Extremities: No pedal edema. Skin: No rashes, lesions or ulcers Psychiatry:slightly confused.   Data Reviewed:    Labs: Basic Metabolic Panel:  Recent Labs Lab 02/15/16 1555 02/15/16 2032 02/16/16 0327 02/17/16 0338 02/18/16 0338  NA 139  --  140 137 137  K 4.3  --  4.1 3.8 3.4*  CL 101  --  109 106 106  CO2 25  --  23 21* 20*  GLUCOSE 154*  --  134* 130* 120*  BUN 26*  --  34* 35* 32*  CREATININE 1.52*  --  1.55* 1.28* 1.02*  CALCIUM 9.5  --  8.1* 8.3* 8.0*  MG  --  1.8  --   --   --   PHOS  --  2.7  --   --   --    GFR Estimated Creatinine Clearance: 41 mL/min (by C-G formula based on SCr of 1.02 mg/dL (H)). Liver Function Tests:  Recent Labs Lab 02/15/16 1555 02/16/16 0327  AST 50* 33  ALT 21 17  ALKPHOS 77 52  BILITOT 0.9 0.7  PROT 8.3* 6.5  ALBUMIN 3.6 2.7*   No results for input(s): LIPASE, AMYLASE in the last 168 hours. No results for input(s): AMMONIA in the last 168 hours. Coagulation profile  Recent Labs Lab 02/15/16 1555  INR 1.08    CBC:  Recent Labs Lab 02/15/16 1555 02/16/16 0327 02/17/16 0338 02/18/16 0338  WBC 18.3* 33.2* 32.3* 27.2*  NEUTROABS 17.4* 29.6* 28.2* 24.2*  HGB 13.1 10.5* 11.6* 12.0  HCT 40.3 32.5* 35.5* 36.1  MCV 93.7 95.3 94.2 92.3  PLT 303 260 277 244   Cardiac Enzymes:  Recent Labs Lab 02/15/16 2032 02/16/16 0327 02/16/16 1525  TROPONINI 1.30* 1.26* 1.33*   BNP (last 3 results) No results for input(s): PROBNP in the last 8760 hours. CBG:  Recent Labs Lab 02/15/16 1550  GLUCAP 149*   D-Dimer: No results for input(s): DDIMER in the last 72 hours. Hgb A1c: No results for  input(s): HGBA1C in the last 72 hours. Lipid Profile: No results for input(s): CHOL, HDL, LDLCALC, TRIG, CHOLHDL, LDLDIRECT in the last 72 hours. Thyroid function studies: No results for input(s): TSH, T4TOTAL, T3FREE, THYROIDAB in the last 72 hours.  Invalid input(s): FREET3 Anemia work up: No results for input(s): VITAMINB12, FOLATE, FERRITIN, TIBC, IRON, RETICCTPCT  in the last 72 hours. Sepsis Labs:  Recent Labs Lab 02/15/16 1555  02/15/16 2026 02/15/16 2141 02/16/16 0050 02/16/16 0327 02/17/16 0338 02/18/16 0338  WBC 18.3*  --   --   --   --  33.2* 32.3* 27.2*  LATICACIDVEN  --   < > 3.79* 2.84* 1.9 1.9  --   --   < > = values in this interval not displayed. Microbiology Recent Results (from the past 240 hour(s))  Blood Culture (routine x 2)     Status: Abnormal   Collection Time: 02/15/16  3:56 PM  Result Value Ref Range Status   Specimen Description BLOOD BLOOD LEFT FOREARM  Final   Special Requests BOTTLES DRAWN AEROBIC AND ANAEROBIC 5 CC  Final   Culture  Setup Time   Final    GRAM NEGATIVE RODS IN BOTH AEROBIC AND ANAEROBIC BOTTLES CRITICAL RESULT CALLED TO, READ BACK BY AND VERIFIED WITH: Azzie GlatterJ. Legge Pharm.D. 12:40 02/16/16 ([email or web address])    Culture (A)  Final    ESCHERICHIA COLI Confirmed Extended Spectrum Beta-Lactamase Producer (ESBL) Performed at [facility], [address] [place], [place] [address]    Report Status 02/18/2016 FINAL  Final   Organism ID, Bacteria ESCHERICHIA COLI  Final      Susceptibility   Escherichia coli - MIC*    AMPICILLIN >=32 RESISTANT Resistant     CEFAZOLIN >=64 RESISTANT Resistant     CEFEPIME >=64 RESISTANT Resistant     CEFTAZIDIME RESISTANT Resistant     CEFTRIAXONE >=64 RESISTANT Resistant     CIPROFLOXACIN >=4 RESISTANT Resistant     GENTAMICIN <=1 SENSITIVE Sensitive     IMIPENEM <=0.25 SENSITIVE Sensitive     TRIMETH/SULFA <=20 SENSITIVE Sensitive     AMPICILLIN/SULBACTAM >=32 RESISTANT Resistant     PIP/TAZO <=4  SENSITIVE Sensitive     Extended ESBL POSITIVE Resistant     * ESCHERICHIA COLI  Blood Culture ID Panel (Reflexed)     Status: Abnormal   Collection Time: 02/15/16  3:56 PM  Result Value Ref Range Status   Enterococcus species NOT DETECTED NOT DETECTED Final   Listeria monocytogenes NOT DETECTED NOT DETECTED Final   Staphylococcus species NOT DETECTED NOT DETECTED Final   Staphylococcus aureus NOT DETECTED NOT DETECTED Final   Streptococcus species NOT DETECTED NOT DETECTED Final   Streptococcus agalactiae NOT DETECTED NOT DETECTED Final   Streptococcus pneumoniae NOT DETECTED NOT DETECTED Final   Streptococcus pyogenes NOT DETECTED NOT DETECTED Final   Acinetobacter baumannii NOT DETECTED NOT DETECTED Final   Enterobacteriaceae species DETECTED (A) NOT DETECTED Final    Comment: Enterobacteriaceae represent a large family of gram-negative bacteria, not a single organism. CRITICAL RESULT CALLED TO, READ BACK BY AND VERIFIED WITH: Azzie GlatterJ. Legge Pharm.D. 12:40 02/16/16 ([email or web address])    Enterobacter cloacae complex NOT DETECTED NOT DETECTED Final   Escherichia coli DETECTED (A) NOT DETECTED Final    Comment: CRITICAL RESULT CALLED TO, READ BACK BY AND VERIFIED WITH: Mardee PostinJ. Legge Pharm.D. 12:40 02/16/16 ([email or web address])    Klebsiella oxytoca NOT DETECTED NOT DETECTED Final   Klebsiella pneumoniae NOT DETECTED NOT DETECTED Final   Proteus species NOT DETECTED NOT DETECTED Final   Serratia marcescens NOT DETECTED NOT DETECTED Final   Carbapenem resistance NOT DETECTED NOT DETECTED Final   Haemophilus influenzae NOT DETECTED NOT DETECTED Final   Neisseria meningitidis NOT DETECTED NOT DETECTED Final   Pseudomonas aeruginosa NOT DETECTED NOT DETECTED Final   Candida albicans NOT DETECTED NOT DETECTED Final  Candida glabrata NOT DETECTED NOT DETECTED Final   Candida krusei NOT DETECTED NOT DETECTED Final   Candida parapsilosis NOT DETECTED NOT DETECTED Final   Candida tropicalis NOT DETECTED NOT DETECTED  Final    Comment: Performed at [facility], [address] [place], [place] [address]  Urine culture     Status: Abnormal   Collection Time: 02/15/16  6:16 PM  Result Value Ref Range Status   Specimen Description URINE, CATHETERIZED  Final   Special Requests NONE  Final   Culture (A)  Final    >=100,000 COLONIES/mL ESCHERICHIA COLI Confirmed Extended Spectrum Beta-Lactamase Producer (ESBL) Performed at [facility], [address] [place], [place] [address]    Report Status 02/18/2016 FINAL  Final   Organism ID, Bacteria ESCHERICHIA COLI (A)  Final      Susceptibility   Escherichia coli - MIC*    AMPICILLIN >=32 RESISTANT Resistant     CEFAZOLIN >=64 RESISTANT Resistant     CEFTRIAXONE >=64 RESISTANT Resistant     CIPROFLOXACIN >=4 RESISTANT Resistant     GENTAMICIN >=16 RESISTANT Resistant     IMIPENEM <=0.25 SENSITIVE Sensitive     NITROFURANTOIN <=16 SENSITIVE Sensitive     TRIMETH/SULFA <=20 SENSITIVE Sensitive     AMPICILLIN/SULBACTAM >=32 RESISTANT Resistant     PIP/TAZO 64 INTERMEDIATE Intermediate     Extended ESBL POSITIVE Resistant     * >=100,000 COLONIES/mL ESCHERICHIA COLI  MRSA PCR Screening     Status: None   Collection Time: 02/15/16 10:30 PM  Result Value Ref Range Status   MRSA by PCR NEGATIVE NEGATIVE Final    Comment:        The GeneXpert MRSA Assay (FDA approved for NASAL specimens only), is one component of a comprehensive MRSA colonization surveillance program. It is not intended to diagnose MRSA infection nor to guide or monitor treatment for MRSA infections.      Medications:   . aspirin EC  325 mg Oral Daily  . bacitracin   Topical BID  . collagenase  1 application Topical Once per day on Mon Wed Fri  . DULoxetine  60 mg Oral Daily  . enoxaparin (LOVENOX) injection  40 mg Subcutaneous Q24H  . feeding supplement (ENSURE ENLIVE)  237 mL Oral BID BM  . furosemide  40 mg Intravenous BID  . gabapentin  300 mg Oral BID  .  lactulose  30 g Oral BID  . mouth rinse  15 mL Mouth Rinse BID  . meropenem (MERREM) IV  1 g Intravenous Q12H  . metoCLOPramide (REGLAN) injection  10 mg Intravenous Q6H  . metoprolol tartrate  12.5 mg Oral BID  . miconazole   Topical QODAY  . nystatin  1 Bottle Topical TID  . polyethylene glycol  17 g Oral Daily  . potassium chloride  40 mEq Oral TID  . senna  2 tablet Oral BID  . sodium chloride flush  3 mL Intravenous Q12H   Continuous Infusions: . sodium chloride Stopped (02/18/16 1530)    Time spent: 25 min   LOS: 5 days   Marinda Elk  [facility] Pager [phone]  *Please refer to [email or web address], password [email or web address] to get updated schedule on who will round on this patient, as hospitalists switch teams weekly. If 7PM-7AM, please contact night-coverage at [email or web address], password [email or web address] for any overnight needs.  02/20/2016, 8:49 AM

## 2016-02-20 NOTE — Progress Notes (Signed)
Progress Note  Patient Name: [name] Date of Encounter: 02/20/2016  Primary Cardiologist:  Dr. Graciela Husbands  Subjective   Very weak.  No SOB or pain by her report but she remains confused.   Inpatient Medications    Scheduled Meds: . aspirin EC  325 mg Oral Daily  . bacitracin   Topical BID  . collagenase  1 application Topical Once per day on Mon Wed Fri  . DULoxetine  60 mg Oral Daily  . enoxaparin (LOVENOX) injection  40 mg Subcutaneous Q24H  . feeding supplement (ENSURE ENLIVE)  237 mL Oral BID BM  . furosemide  40 mg Intravenous BID  . gabapentin  300 mg Oral BID  . lactulose  30 g Oral BID  . mouth rinse  15 mL Mouth Rinse BID  . meropenem (MERREM) IV  1 g Intravenous Q12H  . metoCLOPramide (REGLAN) injection  10 mg Intravenous Q6H  . metoprolol tartrate  12.5 mg Oral BID  . miconazole   Topical QODAY  . nystatin  1 Bottle Topical TID  . polyethylene glycol  17 g Oral Daily  . senna  2 tablet Oral BID  . sodium chloride flush  3 mL Intravenous Q12H   Continuous Infusions: . sodium chloride Stopped (02/18/16 1530)   PRN Meds: acetaminophen, albuterol, HYDROcodone-acetaminophen, ipratropium, LORazepam, ondansetron **OR** ondansetron (ZOFRAN) IV   Vital Signs    Vitals:   02/19/16 0600 02/19/16 1429 02/19/16 2103 02/20/16 0429  BP: (!) 147/78 136/63 (!) 146/69 134/66  Pulse:  (!) 57 (!) 59 60  Resp:  (!) 22 20 16   Temp:  98.7 F (37.1 C) 98.3 F (36.8 C) 98.7 F (37.1 C)  TempSrc:  Axillary Axillary Oral  SpO2:  100% 97% 99%  Weight:    228 lb 13.4 oz (103.8 kg)  Height:        Intake/Output Summary (Last 24 hours) at 02/20/16 0829 Last data filed at 02/20/16 0200  Gross per 24 hour  Intake              560 ml  Output                0 ml  Net              560 ml   Filed Weights   02/18/16 0500 02/19/16 0556 02/20/16 0429  Weight: 231 lb 11.3 oz (105.1 kg) 237 lb 10.5 oz (107.8 kg) 228 lb 13.4 oz (103.8 kg)    Telemetry    Ventricular paced  rhythm - Personally Reviewed  ECG    NA - Personally Reviewed  Physical Exam   GEN: No acute distress.  Very frail appearing Neck: No JVD Cardiac: Irregular, no murmurs, rubs, or gallops.  Respiratory: Clear to auscultation bilaterally. GI: Soft, nontender, non-distended  MS: mild/mod edema; No deformity. Neuro:  Nonfocal  Psych: Normal affect , confused  Labs    Chemistry Recent Labs Lab 02/15/16 1555 02/16/16 0327 02/17/16 0338 02/18/16 0338  NA 139 140 137 137  K 4.3 4.1 3.8 3.4*  CL 101 109 106 106  CO2 25 23 21* 20*  GLUCOSE 154* 134* 130* 120*  BUN 26* 34* 35* 32*  CREATININE 1.52* 1.55* 1.28* 1.02*  CALCIUM 9.5 8.1* 8.3* 8.0*  PROT 8.3* 6.5  --   --   ALBUMIN 3.6 2.7*  --   --   AST 50* 33  --   --   ALT 21 17  --   --  ALKPHOS 77 52  --   --   BILITOT 0.9 0.7  --   --   GFRNONAA 28* 27* 35* 46*  GFRAA 33* 32* 40* 53*  ANIONGAP 13 8 10 11      Hematology Recent Labs Lab 02/16/16 0327 02/17/16 0338 02/18/16 0338  WBC 33.2* 32.3* 27.2*  RBC 3.41* 3.77* 3.91  HGB 10.5* 11.6* 12.0  HCT 32.5* 35.5* 36.1  MCV 95.3 94.2 92.3  MCH 30.8 30.8 30.7  MCHC 32.3 32.7 33.2  RDW 16.4* 16.9* 16.6*  PLT 260 277 244    Cardiac Enzymes Recent Labs Lab 02/15/16 2032 02/16/16 0327 02/16/16 1525  TROPONINI 1.30* 1.26* 1.33*    Recent Labs Lab 02/15/16 1608  TROPIPOC 1.26*     BNPNo results for input(s): BNP, PROBNP in the last 168 hours.   DDimer No results for input(s): DDIMER in the last 168 hours.   Radiology    No results found.  Cardiac Studies   Echo 02/16/16 Study Conclusions  - Left ventricle: The cavity size was normal. Wall thickness was increased in a pattern of moderate LVH. Systolic function was normal. The estimated ejection fraction was in the range of 35% to 40%. Akinesis of the apicalanteroseptal, lateral, inferior, and apical myocardium. Features are consistent with a pseudonormal left ventricular filling  pattern, with concomitant abnormal relaxation and increased filling pressure (grade 2 diastolic dysfunction). - Aortic valve: Mildly to moderately calcified annulus. - Mitral valve: Mildly to moderately calcified annulus. There was mild regurgitation. Valve area by continuity equation (using LVOT flow): 1.67 cm^2. - Tricuspid valve: There was mild-moderate regurgitation. - Pulmonary arteries: Systolic pressure was mildly to moderately increased. PA peak pressure: 40 mm Hg (S).  Patient Profile     83 y.o. female with a history of a pacemaker, admitted 02/15/16 from assisted living with sepsis. Her Troponin was elevated but not rising. Echo shows LVD with apical AK-EF 35-40%. She also appears to have a bowel obstruction.   Assessment & Plan    ELEVATED TROPONIN:  Flat trend.  No further work up is planned.   CARDIOMYOPATHY:  Taktsubo.  Lasix started yesterday secondary to increased weights.   I/O incomplete since admission.  Agree that she appears to be slightly volume overloaded.  Continue IV diuresis today.  Likely start PO in the AM.   I have not titrated meds further.  Prefer very conservative therapy rather than further polypharmacy.   Signed, Rollene RotundaJames Maida Widger, MD  02/20/2016, 8:29 AM

## 2016-02-21 ENCOUNTER — Inpatient Hospital Stay (HOSPITAL_COMMUNITY): Payer: Medicare Other

## 2016-02-21 DIAGNOSIS — R748 Abnormal levels of other serum enzymes: Secondary | ICD-10-CM

## 2016-02-21 DIAGNOSIS — I480 Paroxysmal atrial fibrillation: Secondary | ICD-10-CM

## 2016-02-21 LAB — BASIC METABOLIC PANEL
Anion gap: 9 (ref 5–15)
BUN: 30 mg/dL — AB (ref 6–20)
CHLORIDE: 101 mmol/L (ref 101–111)
CO2: 27 mmol/L (ref 22–32)
CREATININE: 0.94 mg/dL (ref 0.44–1.00)
Calcium: 8.2 mg/dL — ABNORMAL LOW (ref 8.9–10.3)
GFR, EST AFRICAN AMERICAN: 58 mL/min — AB (ref >60)
GFR, EST NON AFRICAN AMERICAN: 50 mL/min — AB (ref >60)
Glucose, Bld: 122 mg/dL — ABNORMAL HIGH (ref 65–99)
Potassium: 3.4 mmol/L — ABNORMAL LOW (ref 3.5–5.1)
SODIUM: 137 mmol/L (ref 135–145)

## 2016-02-21 MED ORDER — HYDROCODONE-ACETAMINOPHEN 7.5-325 MG PO TABS: 1.0000 | ORAL_TABLET | Freq: Four times a day (QID) | ORAL | 0 refills | Status: DC | PRN

## 2016-02-21 MED ORDER — SODIUM CHLORIDE 0.9% FLUSH: 10.0000 mL | INTRAVENOUS | Status: DC | PRN

## 2016-02-21 MED ORDER — LORAZEPAM 0.5 MG PO TABS: 0.5000 mg | ORAL_TABLET | Freq: Two times a day (BID) | ORAL | 0 refills | Status: DC

## 2016-02-21 MED ORDER — POTASSIUM CHLORIDE CRYS ER 20 MEQ PO TBCR
40.0000 meq | EXTENDED_RELEASE_TABLET | Freq: Three times a day (TID) | ORAL | Status: DC
  Administered 2016-02-21: 40 meq via ORAL

## 2016-02-21 MED ORDER — POTASSIUM CHLORIDE CRYS ER 20 MEQ PO TBCR
40.0000 meq | EXTENDED_RELEASE_TABLET | Freq: Once | ORAL | Status: AC
  Administered 2016-02-21: 40 meq via ORAL
  Filled 2016-02-21: qty 2

## 2016-02-21 MED ORDER — METOPROLOL TARTRATE 25 MG PO TABS: 12.5000 mg | ORAL_TABLET | Freq: Two times a day (BID) | ORAL | 0 refills | Status: DC

## 2016-02-21 MED ORDER — ERTAPENEM SODIUM 1 G IJ SOLR
1.0000 g | INTRAMUSCULAR | Status: DC
  Filled 2016-02-21: qty 1

## 2016-02-21 MED ORDER — LOSARTAN POTASSIUM 50 MG PO TABS
25.0000 mg | ORAL_TABLET | Freq: Every day | ORAL | Status: DC
  Administered 2016-02-21: 25 mg via ORAL

## 2016-02-21 MED ORDER — DIAZEPAM 2 MG PO TABS: ORAL_TABLET | ORAL | 0 refills | Status: DC

## 2016-02-21 MED ORDER — HEPARIN SOD (PORK) LOCK FLUSH 100 UNIT/ML IV SOLN
250.0000 [IU] | INTRAVENOUS | Status: AC | PRN
  Administered 2016-02-21: 250 [IU]

## 2016-02-21 MED ORDER — LOSARTAN POTASSIUM 25 MG PO TABS: 25.0000 mg | ORAL_TABLET | Freq: Every day | ORAL | Status: DC

## 2016-02-21 MED ORDER — FUROSEMIDE 40 MG PO TABS
40.0000 mg | ORAL_TABLET | Freq: Every day | ORAL | Status: DC
  Administered 2016-02-21: 40 mg via ORAL
  Filled 2016-02-21: qty 1

## 2016-02-21 MED ORDER — SODIUM CHLORIDE 0.9 % IV SOLN
1.0000 g | INTRAVENOUS | Status: DC
  Administered 2016-02-21: 1 g via INTRAVENOUS
  Filled 2016-02-21: qty 1

## 2016-02-21 MED ORDER — ASPIRIN EC 81 MG PO TBEC: 81.0000 mg | DELAYED_RELEASE_TABLET | Freq: Every day | ORAL | Status: DC

## 2016-02-21 MED ORDER — ASPIRIN 81 MG PO TBEC: 81.0000 mg | DELAYED_RELEASE_TABLET | Freq: Every day | ORAL | Status: DC

## 2016-02-21 MED ORDER — SODIUM CHLORIDE 0.9 % IV SOLN: 1.0000 g | INTRAVENOUS | 0 refills | Status: DC

## 2016-02-21 MED ORDER — FUROSEMIDE 40 MG PO TABS: 40.0000 mg | ORAL_TABLET | Freq: Every day | ORAL | 0 refills | Status: AC

## 2016-02-21 NOTE — Progress Notes (Addendum)
Report called to RN at [facility]. AVS printed.  Earnest ConroyBrooke M. Clelia CroftShaw, RN

## 2016-02-21 NOTE — Clinical Social Work Placement (Signed)
Patient is set to discharge to [facility] today. Patient & daughter, Misty StanleyLisa at bedside made aware. Discharge packet given to RN, Nehemiah SettleBrooke. [facility] will be called for transport once PICC line is capped.     Lincoln MaxinKelly Suhana Wilner, LCSW [facility] Clinical Social Worker cell #: [phone]    CLINICAL SOCIAL WORK PLACEMENT  NOTE  Date:  02/21/2016  Patient Details  Name: [name] RoughJoan Molinaro MRN: [id number] Date of Birth: 04/04/1919  Clinical Social Work is seeking post-discharge placement for this patient at the Skilled  Nursing Facility level of care (*CSW will initial, date and re-position this form in  chart as items are completed):  Yes   Patient/family provided with [facility] Clinical Social Work Department's list of facilities offering this level of care within the geographic area requested by the patient (or if unable, by the patient's family).  Yes   Patient/family informed of their freedom to choose among providers that offer the needed level of care, that participate in Medicare, Medicaid or managed care program needed by the patient, have an available bed and are willing to accept the patient.  Yes   Patient/family informed of [facility]'s ownership interest in [facility] and [facility], as well as of the fact that they are under no obligation to receive care at these facilities.  PASRR submitted to EDS on       PASRR number received on       Existing PASRR number confirmed on 02/18/16     FL2 transmitted to all facilities in geographic area requested by pt/family on 02/18/16     FL2 transmitted to all facilities within larger geographic area on       Patient informed that his/her managed care company has contracts with or will negotiate with certain facilities, including the following:        Yes   Patient/family informed of bed offers received.  Patient chooses bed at [facility]     Physician recommends and patient chooses bed at       Patient to be transferred [facility] on 02/21/16.  Patient to be transferred to facility by [facility]     Patient family notified on 02/21/16 of transfer.  Name of family member notified:  patient's daughter, Misty StanleyLisa at bedside     PHYSICIAN       Additional Comment:    _______________________________________________ Arlyss RepressHarrison, Desteni Piscopo F, LCSW 02/21/2016, 12:57 PM

## 2016-02-21 NOTE — Progress Notes (Signed)
[facility] called for transport.     Zakary Kimura, LCSW [facility] Clinical Social Worker cell #: [phone]  

## 2016-02-21 NOTE — Discharge Summary (Addendum)
Physician Discharge Summary  [name] RoughJoan Stevens AOZ:[id number] DOB: 03/18/1919 DOA: 02/15/2016  PCP: [name] JennyRIPP, HENRY, MD  Admit date: 02/15/2016 Discharge date: 02/23/2016  Admitted From: [facility] Disposition:  SNF  Recommendations for Outpatient Follow-up:  1. Follow up with PCP in 1-2 weeks 2. Please obtain BMP/CBC in one week, Remove PICC line on 03/04/2016. Once she has completed her antibiotic regimen.  Home Health:No Equipment/Devices:none  Discharge Condition:Stable CODE STATUS:DNR Diet recommendation: Heart Healthy  Brief/Interim Summary:  83 y.o. female with medical history significant of complete heart block, pacemaker placement, syncope, peripheral neuropathy, depression, constipation who comes from  [facility] due to weakness, lethargy, emesis, fever of 103.51F for one day. Per facility staff, the patient normally is able to sit on her chair, but stayed in bed today. She apparently had an episode of emesis, then per facility staff the patient became unresponsive and they call EMS. EMS gave the patient 1000 mg of acetaminophen. She is currently unable to provide further history.  Discharge Diagnoses:  Principal Problem:   Sepsis secondary to UTI [facility](HCC) Active Problems:   Paroxysmal atrial fibrillation (HCC)   Peripheral neuropathy (HCC)   Severe sepsis (HCC)   Elevated troponin   AKI (acute kidney injury) (HCC)   Lactic acidosis   Community acquired pneumonia   Bowel obstruction   E coli bacteremia   Sepsis secondary to UTI /ESBL bacteremia: She was started empirically on IV Rocephin and blood cultures were drawn next week, ESBL. She was changed to IV meropenem we she will continue at the facility until 03/04/2016. Repeat BC on 02/18/1998 1718 are negative. Picc line inserted on 2.5.2014  Metabolic encephalopathy: CT is unrevealing likely due to infectious etiology.  Severe constipation: She was given Mag citrate and tap water enema and MiraLAX by mouth twice. Now  resolved, will cont regimen of exercises though.  Elevated troponins: Likely due to sepsis, see echo showed Takatsubo's physiology. She's not a candidate for cardiac cath discussed in detail with daughter. Continue low-dose oral metoprolol and a baby aspirin. Appreciate cardiology's assistance.  Acute kidney injury: With a baseline creatinine of less than 1 now at baseline. Likely prerenal etiology.  Paroxysmal atrial fibrillation (HCC)/s/p postmaker: CHADs VASc=4 but not anticoagulated in the past secondary to a history of falls.   Chronic diastolic heart failure: On 1.31.2018 Echo showed EF of 40 %, grade 1 diastolic HF. After initial fluid resuscitation she was started on IV Lasix which she diuresed well. She was changed to oral Lasix and she will continue as an outpatient along with a beta blocker and ARB -II.   Peripheral neuropathy (HCC): Cont neurontin  Discharge Instructions  Discharge Instructions    Diet - low sodium heart healthy    Complete by:  As directed    Increase activity slowly    Complete by:  As directed      Allergies as of 02/21/2016      Reactions   Latex Itching, Rash, Other (See Comments)   Bandaging and tape w/adhesive "sometimes get rash; sometimes pretty bad" Causes redness      Medication List    STOP taking these medications   HYDROcodone-acetaminophen 7.5-325 MG tablet Commonly known as:  NORCO   loperamide 2 MG capsule Commonly known as:  IMODIUM   LORazepam 0.5 MG tablet Commonly known as:  ATIVAN     TAKE these medications   acetaminophen 325 MG tablet Commonly known as:  TYLENOL Take 650 mg by mouth every 6 (six) hours as needed for moderate  pain.   aspirin 81 MG EC tablet Take 1 tablet (81 mg total) by mouth daily. What changed:  medication strength  how much to take  when to take this   BAZA EX Apply 1 application topically 3 (three) times a week.   collagenase ointment Commonly known as:  SANTYL Apply 1  application topically 3 (three) times a week.   diazepam 2 MG tablet Commonly known as:  VALIUM 1 by mouth every morning NOTE DOSE, 1 by mouth every night at bedtime NOT DOSE   DULoxetine 60 MG capsule Commonly known as:  CYMBALTA Take 1 capsule (60 mg total) by mouth daily.   feeding supplement (ENSURE ENLIVE) Liqd Take 237 mLs by mouth 2 (two) times daily between meals.   ferrous sulfate 325 (65 FE) MG EC tablet Take 325 mg by mouth daily with breakfast.   furosemide 40 MG tablet Commonly known as:  LASIX Take 1 tablet (40 mg total) by mouth daily.   gabapentin 300 MG capsule Commonly known as:  NEURONTIN Take 300 mg by mouth 2 (two) times daily.   lactulose 10 GM/15ML solution Commonly known as:  CHRONULAC Take 30 g by mouth 2 (two) times daily as needed for mild constipation.   losartan 25 MG tablet Commonly known as:  COZAAR Take 1 tablet (25 mg total) by mouth daily.   metoprolol tartrate 25 MG tablet Commonly known as:  LOPRESSOR Take 0.5 tablets (12.5 mg total) by mouth 2 (two) times daily.   MULTIVITAMIN ADULTS Tabs Take 1 tablet by mouth daily.   nystatin powder Commonly known as:  MYCOSTATIN/NYSTOP Apply 1 Bottle topically daily as needed (candidiasis). What changed:  Another medication with the same name was removed. Continue taking this medication, and follow the directions you see here.   senna 8.6 MG Tabs tablet Commonly known as:  SENOKOT Take 2 tablets by mouth 2 (two) times daily.   traMADol 50 MG tablet Commonly known as:  ULTRAM Take 50 mg by mouth every 6 (six) hours as needed for severe pain.   traZODone 50 MG tablet Commonly known as:  DESYREL Take 50 mg by mouth at bedtime.   vitamin A 8000 UNIT capsule Take 8,000 Units by mouth daily.   vitamin C 500 MG tablet Commonly known as:  ASCORBIC ACID Take 500 mg by mouth daily.   Zinc 50 MG Tabs Take 1 tablet by mouth daily.      Contact information for after-discharge care     Destination    [facility] Follow up.   Specialty:  Skilled Nursing Facility Contact information: [address] [place] [address] [phone]             Allergies  Allergen Reactions  . Latex Itching, Rash and Other (See Comments)    Bandaging and tape w/adhesive "sometimes get rash; sometimes pretty bad" Causes redness     Consultations:  None   Procedures/Studies: Ct Abdomen Pelvis Wo Contrast  Addendum Date: 02/15/2016   ADDENDUM REPORT: 02/15/2016 17:56 ADDENDUM: Mild stranding within the fat adjacent to the sigmoid colon may be due to venous congestion. Electronically Signed   By: Deatra Robinson M.D.   On: 02/15/2016 17:56   Result Date: 02/15/2016 CLINICAL DATA:  Altered mental status. Nausea and vomiting. Diarrhea. EXAM: CT ABDOMEN AND PELVIS WITHOUT CONTRAST TECHNIQUE: Multidetector CT imaging of the abdomen and pelvis was performed following the standard protocol without IV contrast. COMPARISON:  None. FINDINGS: Lower chest: No  pulmonary nodules. No visible pleural or pericardial effusion. Hepatobiliary: Normal hepatic size and contours. No perihepatic ascites. No intra- or extrahepatic biliary dilatation. Status post cholecystectomy. Pancreas: There is fatty atrophy of the pancreas. Spleen: Small calcified splenic granuloma. The spleen is otherwise normal. Adrenals/Urinary Tract: Normal adrenal glands. The urinary bladder is distended with mild bilateral hydronephrosis. No evidence of ureteral obstruction. There is an exophytic low-attenuation lesion arising from the midportion of the right kidney. Stomach/Bowel: There is a large amount of stool within the distal sigmoid colon and rectum. There is no dilated small bowel. There is a right lower quadrant Spigelian hernia that contains a nondilated loop of bowel. The appendix is normal. Vascular/Lymphatic: There is atherosclerotic calcification of the non aneurysmal abdominal aorta.  The distal right ovarian vein is mildly dilated. No abdominal or pelvic adenopathy. Reproductive: Uterus is unremarkable.  No free fluid in the pelvis. Musculoskeletal: The bones are osteopenic. There is mild height loss of the T11 vertebral body. Normal visualized extrathoracic and extraperitoneal soft tissues. Other: No contributory non-categorized findings. IMPRESSION: 1. Large amount of stool within the rectum and distal sigmoid colon. This may indicate fecal impaction. No evidence of proximal obstruction. 2. Small right lower quadrant Spigelian hernia containing a loop of bowel without evidence of obstruction. 3. Distended urinary bladder with mild bilateral hydronephrosis. No ureteral obstruction identified. Consider bladder scan and/or catheterization. Electronically Signed: By: Deatra Robinson M.D. On: 02/15/2016 17:53   Ct Head Wo Contrast  Result Date: 02/15/2016 CLINICAL DATA:  Altered mental status EXAM: CT HEAD WITHOUT CONTRAST TECHNIQUE: Contiguous axial images were obtained from the base of the skull through the vertex without intravenous contrast. COMPARISON:  05/24/2014 FINDINGS: Brain: Diffuse atrophic changes and chronic white matter ischemic change is again identified. No findings to suggest acute hemorrhage, acute infarction or space-occupying mass lesion are noted. Vascular: No hyperdense vessel or unexpected calcification. Skull: Normal. Negative for fracture or focal lesion. Sinuses/Orbits: No acute finding. Other: None. IMPRESSION: Chronic atrophic and white matter ischemic changes. No acute abnormality noted. Electronically Signed   By: Alcide Clever M.D.   On: 02/15/2016 17:36   Dg Chest Port 1 View  Result Date: 02/21/2016 CLINICAL DATA:  PICC line placement. Complete heart block. Dementia. EXAM: PORTABLE CHEST 1 VIEW COMPARISON:  02/15/2016 FINDINGS: The patient is rotated to the right on today's radiograph, reducing diagnostic sensitivity and specificity. 3 pacer leads are visible  although 1 of these appears to be disconnected. A right PICC line tip is thought to terminate at the cavoatrial junction. Low lung volumes are present, causing crowding of the pulmonary vasculature. Hilar prominence may be vascular or due to adenopathy, but is distorted due to the rotation. Similarly, cardiac size is difficult to assess due to the low lung volumes and rotation, I cannot exclude mild enlargement of the cardiopericardial silhouette. Suspected mild central airway thickening.  Vascular crowding. Thoracic spondylosis. Chronic bilateral rotator cuff tears are suspected based on eliminated acromiohumeral distance. No definite airspace opacities are observed. Admittedly the area of left lower lobe opacity of concern on the prior chest radiograph from 1 week ago is currently blocked by the patient's pacemaker. Atherosclerotic calcification of the aortic arch. IMPRESSION: 1. Indeterminate assessment of the prior left lower lobe opacity, the patient is turned so far to the right that the pacemaker blocks the specific area of prior concern. 2. Bilateral hilar prominence could be vascular or due to adenopathy. 3. Low lung volumes with resulting vascular crowding. 4. Airway thickening is present,  suggesting bronchitis or reactive airways disease. 5. Suspected bilateral chronic rotator cuff tears. 6.  Atherosclerotic calcification of the aortic arch. 7. Right PICC line tip: Cavoatrial junction. Electronically Signed   By: Gaylyn Rong M.D.   On: 02/21/2016 12:40   Dg Chest Portable 1 View  Result Date: 02/15/2016 CLINICAL DATA:  Flu symptoms, fever to 105 degrees, shortness of breath, weakness, dementia EXAM: PORTABLE CHEST 1 VIEW COMPARISON:  Portable exam 1556 hours compared to 05/23/2014 FINDINGS: LEFT subclavian pacemaker leads project over RIGHT atrium and RIGHT ventricle unchanged. Borderline enlargement of cardiac silhouette with pulmonary vascular congestion. Atherosclerotic calcification aorta.  LEFT lower lobe infiltrate. Remaining lungs clear. No pleural effusion or pneumothorax. Bones demineralized with BILATERAL chronic rotator cuff tears. IMPRESSION: LEFT lower lobe infiltrate. Electronically Signed   By: Ulyses Southward M.D.   On: 02/15/2016 16:22     Subjective: No complains  Discharge Exam: Vitals:   02/21/16 0453 02/21/16 1213  BP: 138/70   Pulse: 60 60  Resp: 18   Temp: 97.5 F (36.4 C)    Vitals:   02/20/16 2125 02/21/16 0422 02/21/16 0453 02/21/16 1213  BP: (!) 150/63 (!) 155/73 138/70   Pulse: (!) 59 (!) 59 60 60  Resp: 16 16 18    Temp: 98.5 F (36.9 C) 98.3 F (36.8 C) 97.5 F (36.4 C)   TempSrc: Axillary Axillary Oral   SpO2: 100% 95% 96%   Weight:  100.6 kg (221 lb 12.5 oz)    Height:        General: Pt is alert, awake, not in acute distress Cardiovascular: RRR, S1/S2 +, no rubs, no gallops Respiratory: CTA bilaterally, no wheezing, no rhonchi Abdominal: Soft, NT, ND, bowel sounds + Extremities: no edema, no cyanosis    The results of significant diagnostics from this hospitalization (including imaging, microbiology, ancillary and laboratory) are listed below for reference.     Microbiology: Recent Results (from the past 240 hour(s))  Blood Culture (routine x 2)     Status: Abnormal   Collection Time: 02/15/16  3:56 PM  Result Value Ref Range Status   Specimen Description BLOOD BLOOD LEFT FOREARM  Final   Special Requests BOTTLES DRAWN AEROBIC AND ANAEROBIC 5 CC  Final   Culture  Setup Time   Final    GRAM NEGATIVE RODS IN BOTH AEROBIC AND ANAEROBIC BOTTLES CRITICAL RESULT CALLED TO, READ BACK BY AND VERIFIED WITH: Azzie Glatter Pharm.D. 12:40 02/16/16 ([email or web address])    Culture (A)  Final    ESCHERICHIA COLI Confirmed Extended Spectrum Beta-Lactamase Producer (ESBL) Performed at [facility], [address] [place], [place] [address]    Report Status 02/18/2016 FINAL  Final   Organism ID, Bacteria ESCHERICHIA COLI  Final       Susceptibility   Escherichia coli - MIC*    AMPICILLIN >=32 RESISTANT Resistant     CEFAZOLIN >=64 RESISTANT Resistant     CEFEPIME >=64 RESISTANT Resistant     CEFTAZIDIME RESISTANT Resistant     CEFTRIAXONE >=64 RESISTANT Resistant     CIPROFLOXACIN >=4 RESISTANT Resistant     GENTAMICIN <=1 SENSITIVE Sensitive     IMIPENEM <=0.25 SENSITIVE Sensitive     TRIMETH/SULFA <=20 SENSITIVE Sensitive     AMPICILLIN/SULBACTAM >=32 RESISTANT Resistant     PIP/TAZO <=4 SENSITIVE Sensitive     Extended ESBL POSITIVE Resistant     * ESCHERICHIA COLI  Blood Culture ID Panel (Reflexed)     Status: Abnormal   Collection Time: 02/15/16  3:56 PM  Result Value Ref Range Status   Enterococcus species NOT DETECTED NOT DETECTED Final   Listeria monocytogenes NOT DETECTED NOT DETECTED Final   Staphylococcus species NOT DETECTED NOT DETECTED Final   Staphylococcus aureus NOT DETECTED NOT DETECTED Final   Streptococcus species NOT DETECTED NOT DETECTED Final   Streptococcus agalactiae NOT DETECTED NOT DETECTED Final   Streptococcus pneumoniae NOT DETECTED NOT DETECTED Final   Streptococcus pyogenes NOT DETECTED NOT DETECTED Final   Acinetobacter baumannii NOT DETECTED NOT DETECTED Final   Enterobacteriaceae species DETECTED (A) NOT DETECTED Final    Comment: Enterobacteriaceae represent a large family of gram-negative bacteria, not a single organism. CRITICAL RESULT CALLED TO, READ BACK BY AND VERIFIED WITH: Azzie Glatter Pharm.D. 12:40 02/16/16 ([email or web address])    Enterobacter cloacae complex NOT DETECTED NOT DETECTED Final   Escherichia coli DETECTED (A) NOT DETECTED Final    Comment: CRITICAL RESULT CALLED TO, READ BACK BY AND VERIFIED WITH: Mardee Postin.D. 12:40 02/16/16 ([email or web address])    Klebsiella oxytoca NOT DETECTED NOT DETECTED Final   Klebsiella pneumoniae NOT DETECTED NOT DETECTED Final   Proteus species NOT DETECTED NOT DETECTED Final   Serratia marcescens NOT DETECTED NOT DETECTED Final   Carbapenem  resistance NOT DETECTED NOT DETECTED Final   Haemophilus influenzae NOT DETECTED NOT DETECTED Final   Neisseria meningitidis NOT DETECTED NOT DETECTED Final   Pseudomonas aeruginosa NOT DETECTED NOT DETECTED Final   Candida albicans NOT DETECTED NOT DETECTED Final   Candida glabrata NOT DETECTED NOT DETECTED Final   Candida krusei NOT DETECTED NOT DETECTED Final   Candida parapsilosis NOT DETECTED NOT DETECTED Final   Candida tropicalis NOT DETECTED NOT DETECTED Final    Comment: Performed at [facility], [address] [place], [place] [address]  Urine culture     Status: Abnormal   Collection Time: 02/15/16  6:16 PM  Result Value Ref Range Status   Specimen Description URINE, CATHETERIZED  Final   Special Requests NONE  Final   Culture (A)  Final    >=100,000 COLONIES/mL ESCHERICHIA COLI Confirmed Extended Spectrum Beta-Lactamase Producer (ESBL) Performed at [facility], [address], [place], [place] [address]    Report Status 02/18/2016 FINAL  Final   Organism ID, Bacteria ESCHERICHIA COLI (A)  Final      Susceptibility   Escherichia coli - MIC*    AMPICILLIN >=32 RESISTANT Resistant     CEFAZOLIN >=64 RESISTANT Resistant     CEFTRIAXONE >=64 RESISTANT Resistant     CIPROFLOXACIN >=4 RESISTANT Resistant     GENTAMICIN >=16 RESISTANT Resistant     IMIPENEM <=0.25 SENSITIVE Sensitive     NITROFURANTOIN <=16 SENSITIVE Sensitive     TRIMETH/SULFA <=20 SENSITIVE Sensitive     AMPICILLIN/SULBACTAM >=32 RESISTANT Resistant     PIP/TAZO 64 INTERMEDIATE Intermediate     Extended ESBL POSITIVE Resistant     * >=100,000 COLONIES/mL ESCHERICHIA COLI  MRSA PCR Screening     Status: None   Collection Time: 02/15/16 10:30 PM  Result Value Ref Range Status   MRSA by PCR NEGATIVE NEGATIVE Final    Comment:        The GeneXpert MRSA Assay (FDA approved for NASAL specimens only), is one component of a comprehensive MRSA colonization surveillance program. It is  not intended to diagnose MRSA infection nor to guide or monitor treatment for MRSA infections.   Culture, blood (Routine X 2) w Reflex to ID Panel     Status:  None (Preliminary result)   Collection Time: 02/19/16  9:05 AM  Result Value Ref Range Status   Specimen Description BLOOD RIGHT ANTECUBITAL  Final   Special Requests IN PEDIATRIC BOTTLE 3CC  Final   Culture   Final    NO GROWTH 3 DAYS Performed at [facility], [address], [place], [place] [address]    Report Status PENDING  Incomplete  Culture, blood (single)     Status: None (Preliminary result)   Collection Time: 02/19/16  6:59 PM  Result Value Ref Range Status   Specimen Description BLOOD LEFT HAND  Final   Special Requests IN PEDIATRIC BOTTLE 2CC  Final   Culture   Final    NO GROWTH 2 DAYS Performed at [facility], [address], [place], [place] [address]    Report Status PENDING  Incomplete     Labs: BNP (last 3 results) No results for input(s): BNP in the last 8760 hours. Basic Metabolic Panel:  Recent Labs Lab 02/17/16 0338 02/18/16 0338 02/20/16 1029 02/21/16 0458  NA 137 137 136 137  K 3.8 3.4* 3.4* 3.4*  CL 106 106 102 101  CO2 21* 20* 23 27  GLUCOSE 130* 120* 165* 122*  BUN 35* 32* 36* 30*  CREATININE 1.28* 1.02* 1.08* 0.94  CALCIUM 8.3* 8.0* 8.3* 8.2*   Liver Function Tests: No results for input(s): AST, ALT, ALKPHOS, BILITOT, PROT, ALBUMIN in the last 168 hours. No results for input(s): LIPASE, AMYLASE in the last 168 hours. No results for input(s): AMMONIA in the last 168 hours. CBC:  Recent Labs Lab 02/17/16 0338 02/18/16 0338 02/20/16 1029  WBC 32.3* 27.2* 18.2*  NEUTROABS 28.2* 24.2*  --   HGB 11.6* 12.0 12.3  HCT 35.5* 36.1 36.7  MCV 94.2 92.3 86.8  PLT 277 244 279   Cardiac Enzymes:  Recent Labs Lab 02/16/16 1525  TROPONINI 1.33*   BNP: Invalid input(s): POCBNP CBG: No results for input(s): GLUCAP in the last 168 hours. D-Dimer No results for  input(s): DDIMER in the last 72 hours. Hgb A1c No results for input(s): HGBA1C in the last 72 hours. Lipid Profile No results for input(s): CHOL, HDL, LDLCALC, TRIG, CHOLHDL, LDLDIRECT in the last 72 hours. Thyroid function studies No results for input(s): TSH, T4TOTAL, T3FREE, THYROIDAB in the last 72 hours.  Invalid input(s): FREET3 Anemia work up No results for input(s): VITAMINB12, FOLATE, FERRITIN, TIBC, IRON, RETICCTPCT in the last 72 hours. Urinalysis    Component Value Date/Time   COLORURINE AMBER (A) 02/15/2016 1816   APPEARANCEUR CLOUDY (A) 02/15/2016 1816   LABSPEC 1.014 02/15/2016 1816   PHURINE 5.0 02/15/2016 1816   GLUCOSEU NEGATIVE 02/15/2016 1816   HGBUR SMALL (A) 02/15/2016 1816   BILIRUBINUR NEGATIVE 02/15/2016 1816   KETONESUR NEGATIVE 02/15/2016 1816   PROTEINUR 30 (A) 02/15/2016 1816   UROBILINOGEN 0.2 05/25/2014 1330   NITRITE NEGATIVE 02/15/2016 1816   LEUKOCYTESUR LARGE (A) 02/15/2016 1816   Sepsis Labs Invalid input(s): PROCALCITONIN,  WBC,  LACTICIDVEN Microbiology Recent Results (from the past 240 hour(s))  Blood Culture (routine x 2)     Status: Abnormal   Collection Time: 02/15/16  3:56 PM  Result Value Ref Range Status   Specimen Description BLOOD BLOOD LEFT FOREARM  Final   Special Requests BOTTLES DRAWN AEROBIC AND ANAEROBIC 5 CC  Final   Culture  Setup Time   Final    GRAM NEGATIVE RODS IN BOTH AEROBIC AND ANAEROBIC BOTTLES CRITICAL RESULT CALLED  TO, READ BACK BY AND VERIFIED WITH: Azzie Glatter Pharm.D. 12:40 02/16/16 ([email or web address])    Culture (A)  Final    ESCHERICHIA COLI Confirmed Extended Spectrum Beta-Lactamase Producer (ESBL) Performed at [facility], [address] [place], [place] [address]    Report Status 02/18/2016 FINAL  Final   Organism ID, Bacteria ESCHERICHIA COLI  Final      Susceptibility   Escherichia coli - MIC*    AMPICILLIN >=32 RESISTANT Resistant     CEFAZOLIN >=64 RESISTANT Resistant     CEFEPIME >=64  RESISTANT Resistant     CEFTAZIDIME RESISTANT Resistant     CEFTRIAXONE >=64 RESISTANT Resistant     CIPROFLOXACIN >=4 RESISTANT Resistant     GENTAMICIN <=1 SENSITIVE Sensitive     IMIPENEM <=0.25 SENSITIVE Sensitive     TRIMETH/SULFA <=20 SENSITIVE Sensitive     AMPICILLIN/SULBACTAM >=32 RESISTANT Resistant     PIP/TAZO <=4 SENSITIVE Sensitive     Extended ESBL POSITIVE Resistant     * ESCHERICHIA COLI  Blood Culture ID Panel (Reflexed)     Status: Abnormal   Collection Time: 02/15/16  3:56 PM  Result Value Ref Range Status   Enterococcus species NOT DETECTED NOT DETECTED Final   Listeria monocytogenes NOT DETECTED NOT DETECTED Final   Staphylococcus species NOT DETECTED NOT DETECTED Final   Staphylococcus aureus NOT DETECTED NOT DETECTED Final   Streptococcus species NOT DETECTED NOT DETECTED Final   Streptococcus agalactiae NOT DETECTED NOT DETECTED Final   Streptococcus pneumoniae NOT DETECTED NOT DETECTED Final   Streptococcus pyogenes NOT DETECTED NOT DETECTED Final   Acinetobacter baumannii NOT DETECTED NOT DETECTED Final   Enterobacteriaceae species DETECTED (A) NOT DETECTED Final    Comment: Enterobacteriaceae represent a large family of gram-negative bacteria, not a single organism. CRITICAL RESULT CALLED TO, READ BACK BY AND VERIFIED WITH: Azzie Glatter Pharm.D. 12:40 02/16/16 ([email or web address])    Enterobacter cloacae complex NOT DETECTED NOT DETECTED Final   Escherichia coli DETECTED (A) NOT DETECTED Final    Comment: CRITICAL RESULT CALLED TO, READ BACK BY AND VERIFIED WITH: Mardee Postin.D. 12:40 02/16/16 ([email or web address])    Klebsiella oxytoca NOT DETECTED NOT DETECTED Final   Klebsiella pneumoniae NOT DETECTED NOT DETECTED Final   Proteus species NOT DETECTED NOT DETECTED Final   Serratia marcescens NOT DETECTED NOT DETECTED Final   Carbapenem resistance NOT DETECTED NOT DETECTED Final   Haemophilus influenzae NOT DETECTED NOT DETECTED Final   Neisseria meningitidis NOT DETECTED NOT  DETECTED Final   Pseudomonas aeruginosa NOT DETECTED NOT DETECTED Final   Candida albicans NOT DETECTED NOT DETECTED Final   Candida glabrata NOT DETECTED NOT DETECTED Final   Candida krusei NOT DETECTED NOT DETECTED Final   Candida parapsilosis NOT DETECTED NOT DETECTED Final   Candida tropicalis NOT DETECTED NOT DETECTED Final    Comment: Performed at [facility], [address] [place], [place] [address]  Urine culture     Status: Abnormal   Collection Time: 02/15/16  6:16 PM  Result Value Ref Range Status   Specimen Description URINE, CATHETERIZED  Final   Special Requests NONE  Final   Culture (A)  Final    >=100,000 COLONIES/mL ESCHERICHIA COLI Confirmed Extended Spectrum Beta-Lactamase Producer (ESBL) Performed at [facility], [address] [place], [place] [address]    Report Status 02/18/2016 FINAL  Final   Organism ID, Bacteria ESCHERICHIA COLI (A)  Final      Susceptibility   Escherichia coli -  MIC*    AMPICILLIN >=32 RESISTANT Resistant     CEFAZOLIN >=64 RESISTANT Resistant     CEFTRIAXONE >=64 RESISTANT Resistant     CIPROFLOXACIN >=4 RESISTANT Resistant     GENTAMICIN >=16 RESISTANT Resistant     IMIPENEM <=0.25 SENSITIVE Sensitive     NITROFURANTOIN <=16 SENSITIVE Sensitive     TRIMETH/SULFA <=20 SENSITIVE Sensitive     AMPICILLIN/SULBACTAM >=32 RESISTANT Resistant     PIP/TAZO 64 INTERMEDIATE Intermediate     Extended ESBL POSITIVE Resistant     * >=100,000 COLONIES/mL ESCHERICHIA COLI  MRSA PCR Screening     Status: None   Collection Time: 02/15/16 10:30 PM  Result Value Ref Range Status   MRSA by PCR NEGATIVE NEGATIVE Final    Comment:        The GeneXpert MRSA Assay (FDA approved for NASAL specimens only), is one component of a comprehensive MRSA colonization surveillance program. It is not intended to diagnose MRSA infection nor to guide or monitor treatment for MRSA infections.   Culture, blood (Routine X 2) w Reflex to ID Panel      Status: None (Preliminary result)   Collection Time: 02/19/16  9:05 AM  Result Value Ref Range Status   Specimen Description BLOOD RIGHT ANTECUBITAL  Final   Special Requests IN PEDIATRIC BOTTLE 3CC  Final   Culture   Final    NO GROWTH 3 DAYS Performed at [facility], [address], [place], [place] [address]    Report Status PENDING  Incomplete  Culture, blood (single)     Status: None (Preliminary result)   Collection Time: 02/19/16  6:59 PM  Result Value Ref Range Status   Specimen Description BLOOD LEFT HAND  Final   Special Requests IN PEDIATRIC BOTTLE 2CC  Final   Culture   Final    NO GROWTH 2 DAYS Performed at [facility], [address], [place], [place] [address]    Report Status PENDING  Incomplete     Time coordinating discharge: Over 30 minutes  SIGNED:   Marinda Elk, MD  [facility] 02/23/2016, 1:51 PM Pager   If 7PM-7AM, please contact night-coverage [email or web address] Password [email or web address]

## 2016-02-21 NOTE — Progress Notes (Signed)
Progress Note  Patient Name: [name] Date of Encounter: 02/21/2016  Primary Cardiologist: Dr. Graciela Husbands  Subjective   Awakens, but quickly falls back asleep. Denies any chest pain or palpitations.  Daughter at the bedside, planning for discharge to SNF later today.   Inpatient Medications    Scheduled Meds: . aspirin EC  325 mg Oral Daily  . bacitracin   Topical BID  . collagenase  1 application Topical Once per day on Mon Wed Fri  . DULoxetine  60 mg Oral Daily  . enoxaparin (LOVENOX) injection  40 mg Subcutaneous Q24H  . feeding supplement (ENSURE ENLIVE)  237 mL Oral BID BM  . furosemide  40 mg Intravenous BID  . gabapentin  300 mg Oral BID  . lactulose  30 g Oral BID  . mouth rinse  15 mL Mouth Rinse BID  . meropenem (MERREM) IV  1 g Intravenous Q12H  . metoCLOPramide (REGLAN) injection  10 mg Intravenous Q6H  . metoprolol tartrate  12.5 mg Oral BID  . miconazole   Topical QODAY  . nystatin  1 Bottle Topical TID  . polyethylene glycol  17 g Oral Daily  . senna  2 tablet Oral BID  . sodium chloride flush  3 mL Intravenous Q12H   Continuous Infusions: . sodium chloride Stopped (02/18/16 1530)   PRN Meds: acetaminophen, albuterol, HYDROcodone-acetaminophen, ipratropium, LORazepam, ondansetron **OR** ondansetron (ZOFRAN) IV   Vital Signs    Vitals:   02/20/16 1500 02/20/16 2125 02/21/16 0422 02/21/16 0453  BP: (!) 153/71 (!) 150/63 (!) 155/73 138/70  Pulse: 62 (!) 59 (!) 59 60  Resp: 18 16 16 18   Temp: 98.6 F (37 C) 98.5 F (36.9 C) 98.3 F (36.8 C) 97.5 F (36.4 C)  TempSrc: Oral Axillary Axillary Oral  SpO2: 99% 100% 95% 96%  Weight:   221 lb 12.5 oz (100.6 kg)   Height:        Intake/Output Summary (Last 24 hours) at 02/21/16 0921 Last data filed at 02/20/16 1146  Gross per 24 hour  Intake              100 ml  Output                0 ml  Net              100 ml   Filed Weights   02/19/16 0556 02/20/16 0429 02/21/16 0422  Weight: 237 lb 10.5 oz  (107.8 kg) 228 lb 13.4 oz (103.8 kg) 221 lb 12.5 oz (100.6 kg)    Telemetry    V-paced, HR in 60's.  - Personally Reviewed  ECG    No new tracings.   Physical Exam   General: Elderly Caucasian female appearing in no acute distress. Head: Normocephalic, atraumatic.  Neck: Supple without bruits, JVD at 9cm. Lungs:  Resp regular and unlabored, CTA without wheezing or rales anteriorly. Heart: RRR, S1, S2, no S3, S4, or murmur; no rub. Abdomen: Soft, non-tender, non-distended with normoactive bowel sounds. No hepatomegaly. No rebound/guarding. No obvious abdominal masses. Extremities: No clubbing, cyanosis, or edema. Distal pedal pulses are 2+ bilaterally. Neuro: Awakens but quickly falls back asleep. Responds to name.  Psych: Normal affect.  Labs    Chemistry Recent Labs Lab 02/15/16 1555 02/16/16 0327  02/18/16 0338 02/20/16 1029 02/21/16 0458  NA 139 140  < > 137 136 137  K 4.3 4.1  < > 3.4* 3.4* 3.4*  CL 101 109  < > 106  102 101  CO2 25 23  < > 20* 23 27  GLUCOSE 154* 134*  < > 120* 165* 122*  BUN 26* 34*  < > 32* 36* 30*  CREATININE 1.52* 1.55*  < > 1.02* 1.08* 0.94  CALCIUM 9.5 8.1*  < > 8.0* 8.3* 8.2*  PROT 8.3* 6.5  --   --   --   --   ALBUMIN 3.6 2.7*  --   --   --   --   AST 50* 33  --   --   --   --   ALT 21 17  --   --   --   --   ALKPHOS 77 52  --   --   --   --   BILITOT 0.9 0.7  --   --   --   --   GFRNONAA 28* 27*  < > 46* 43* 50*  GFRAA 33* 32*  < > 53* 49* 58*  ANIONGAP 13 8  < > 11 11 9   < > = values in this interval not displayed.   Hematology Recent Labs Lab 02/17/16 0338 02/18/16 0338 02/20/16 1029  WBC 32.3* 27.2* 18.2*  RBC 3.77* 3.91 4.23  HGB 11.6* 12.0 12.3  HCT 35.5* 36.1 36.7  MCV 94.2 92.3 86.8  MCH 30.8 30.7 29.1  MCHC 32.7 33.2 33.5  RDW 16.9* 16.6* 15.9*  PLT 277 244 279    Cardiac Enzymes Recent Labs Lab 02/15/16 2032 02/16/16 0327 02/16/16 1525  TROPONINI 1.30* 1.26* 1.33*    Recent Labs Lab 02/15/16 1608    TROPIPOC 1.26*     BNPNo results for input(s): BNP, PROBNP in the last 168 hours.   DDimer No results for input(s): DDIMER in the last 168 hours.   Radiology    No results found.  Cardiac Studies   Echo 02/16/16 Study Conclusions  - Left ventricle: The cavity size was normal. Wall thickness was increased in a pattern of moderate LVH. Systolic function was normal. The estimated ejection fraction was in the range of 35% to 40%. Akinesis of the apicalanteroseptal, lateral, inferior, and apical myocardium. Features are consistent with a pseudonormal left ventricular filling pattern, with concomitant abnormal relaxation and increased filling pressure (grade 2 diastolic dysfunction). - Aortic valve: Mildly to moderately calcified annulus. - Mitral valve: Mildly to moderately calcified annulus. There was mild regurgitation. Valve area by continuity equation (using LVOT flow): 1.67 cm^2. - Tricuspid valve: There was mild-moderate regurgitation. - Pulmonary arteries: Systolic pressure was mildly to moderately increased. PA peak pressure: 40 mm Hg (S).   Patient Profile     83 y.o. female w/ PMH of PAF (not on anticoagulation secondary to frequent falls), SSS (s/p PPM placement in 2002, Gen Change in 2009) and arthritis admitted with Urosepsis. Cards consulted for elevated troponin.   Assessment & Plan    1. Elevated Troponin - flat trend with values at 1.30, 1.26, and 1.33. Echo shows reduced EF of 35-40% with akinesis of the apicalanteroseptal, lateral, inferior, and apical myocardium.  - likely secondary to demand ischemia in the setting of Urosepsis.  - no further ischemic workup planned with her advanced age, dementia, and no reported symptoms. This was discussed with the patient's daughter who was in agreement with this plan.   2. Cardiomyopathy -Echo c/w Takostubo CM. EF 35-40% with apical akinesis - received fluids on admission in the setting or  urosepsis. Has been diuresed with weight at 221 lbs this AM (223 lbs  on admission). Currently on IV Lasix 40mg  BID.  - Tolerating low dose beta blocker. Not yet started on ACE-I/ARB secondary to AKI. Creatinine improved to 0.94 this AM. Consider the addition of low-dose ARB prior to discharge or at follow-up.   3. Pacemaker - Pt has MDT pacemaker- EOL 20 months. Underlying AF.   4. Paroxysmal Atrial Fibrillation - This patients CHA2DS2-VASc Score and unadjusted Ischemic Stroke Rate (% per year) is equal to 7.2 % stroke rate/year from a score of 5 (CHF, HTN, Female, Age (2). Not on anticoagulation secondary to frequent falls.  - continue Lopressor 12.5mg  BID.   5. Uroepsis - Urine Culture positive for E.coli. WBC peaked at 33.2 on 02/16/2016, at 18.2 on 2/4. - remains on Meropenem.  - per admitting team.    Signed, Ellsworth LennoxBrittany M Sheretha Shadd , PA-C 9:21 AM 02/21/2016 Pager: [phone]

## 2016-02-21 NOTE — Progress Notes (Signed)
Peripherally Inserted Central Catheter/Midline Placement  The IV Nurse has discussed with the patient and/or persons authorized to consent for the patient, the purpose of this procedure and the potential benefits and risks involved with this procedure.  The benefits include less needle sticks, lab draws from the catheter, and the patient may be discharged home with the catheter. Risks include, but not limited to, infection, bleeding, blood clot (thrombus formation), and puncture of an artery; nerve damage and irregular heartbeat and possibility to perform a PICC exchange if needed/ordered by physician.  Alternatives to this procedure were also discussed.  Bard Power PICC patient education guide, fact sheet on infection prevention and patient information card has been provided to patient /or left at bedside.    PICC/Midline Placement Documentation  PICC Single Lumen 02/21/16 PICC Right Basilic 42 cm 0 cm (Active)  Indication for Insertion or Continuance of Line Home intravenous therapies (PICC only) 02/21/2016 12:00 PM  Exposed Catheter (cm) 0 cm 02/21/2016 12:00 PM  Dressing Change Due 02/28/16 02/21/2016 12:00 PM       Stacie GlazeJoyce, Valmai Vandenberghe Horton 02/21/2016, 12:13 PM

## 2016-02-21 NOTE — Care Management Important Message (Signed)
Important Message  Patient Details IM Letter given to Kathy/Case Manager to present to Patient. Name: Micheline RoughJoan Double MRN: [id number] Date of Birth: 06/06/1919   Medicare Important Message Given:  Yes    Caren MacadamFuller, Devontay Celaya 02/21/2016, 11:37 AMImportant Message  Patient Details  Name: Micheline RoughJoan Spirito MRN: [id number] Date of Birth: 09/12/1919   Medicare Important Message Given:  Yes    Caren MacadamFuller, Tynleigh Birt 02/21/2016, 11:37 AM

## 2016-02-22 ENCOUNTER — Encounter: Payer: Self-pay | Admitting: Internal Medicine

## 2016-02-22 ENCOUNTER — Encounter (HOSPITAL_BASED_OUTPATIENT_CLINIC_OR_DEPARTMENT_OTHER): Payer: Medicare Other | Attending: Internal Medicine

## 2016-02-22 ENCOUNTER — Non-Acute Institutional Stay (SKILLED_NURSING_FACILITY): Payer: Medicare Other | Admitting: Internal Medicine

## 2016-02-22 DIAGNOSIS — N390 Urinary tract infection, site not specified: Secondary | ICD-10-CM

## 2016-02-22 DIAGNOSIS — Z9189 Other specified personal risk factors, not elsewhere classified: Secondary | ICD-10-CM | POA: Diagnosis not present

## 2016-02-22 DIAGNOSIS — A419 Sepsis, unspecified organism: Secondary | ICD-10-CM | POA: Diagnosis not present

## 2016-02-22 DIAGNOSIS — R7881 Bacteremia: Secondary | ICD-10-CM

## 2016-02-22 DIAGNOSIS — F0280 Dementia in other diseases classified elsewhere without behavioral disturbance: Secondary | ICD-10-CM | POA: Insufficient documentation

## 2016-02-22 DIAGNOSIS — B9620 Unspecified Escherichia coli [E. coli] as the cause of diseases classified elsewhere: Secondary | ICD-10-CM

## 2016-02-22 NOTE — Assessment & Plan Note (Signed)
Complete full course of Ertapenem

## 2016-02-22 NOTE — Assessment & Plan Note (Signed)
opiods will be weaned and discontinued if possible

## 2016-02-22 NOTE — Patient Instructions (Addendum)
See assessment and plan under each diagnosis in the problem list and acutely for this visit Total time 52 minutes including counseling patient's daughter and coordinating care for problems addressed at this encounter

## 2016-02-22 NOTE — Assessment & Plan Note (Signed)
Wean sedating medications

## 2016-02-22 NOTE — Progress Notes (Signed)
Facility Location: [facility]  Room Number: 213  Code Status: DNR  PCP: [name], MD [phone] [address] [place] [address]  This is a comprehensive admission note to [facility] performed on this date less than 30 days from date of admission. Included are preadmission medical/surgical history;reconciled medication list; family history; social history and comprehensive review of systems.  Corrections and additions to the records were documented . Comprehensive physical exam was also performed. Additionally a clinical summary was entered for each active diagnosis pertinent to this admission in the Problem List to enhance continuity of care.  HPI: The patient was admitted from [facility] 02/15/16 due to weakness, lethargy, emesis, and temperature 103.3. After the episode of emesis the patient became unresponsive. She was hospitalized until 02/21/16 with sepsis related to ESBL E coli UTI associated with elevated troponin ,lactic acidosis, acute kidney injury, and metabolic encephalopathy. Blood cultures 02/15/16 were positive in both the aerobic and anaerobic bottles. Ejection fraction was 40% & grade 1 diastolic heart failure was present on the echo 1/31  Past medical and surgical history: Includes syncope in 2013, peripheral neuropathy, pacemaker insertion (2002 and 2009 )and dementia. Surgeries include total knee arthroplasty, ankle fracture, femur fracture surgery, and cholecystectomy. When I reviewed the medications 02/21/16 pre admission  ,I did decrease the dose of narcotics and discontinued the Lorazepam which was in addition to diazepam. Her high-dose aspirin dose was decreased as well as because of the risk of gastric injury. I was finally able to interview her daughter after 5 pm 2/6.She stated her mother was not on opiods or Valium pre-admission. She had been on Lorazepam & Tramadol.  Social history: Reviewed  Family history:  Reviewed  Review of systems:Could not be completed due to dementia. Date given as 1942 on 3 separate occasions. Even though she knew she was born in [birth year]  & was 47 , she still gave year as 32. She had no idea of her doctor's name.She cannot remember her daughter's birthdate."She has never remembered my birthday" stated her daughter. She did name the president. Her only complaint is "tired". She does admit to anxiety/depression "off & on " She denies any pain.   Cardiovascular: No chest pain, palpitations,paroxysmal nocturnal dyspnea, claudication, edema  Respiratory: No cough, sputum production,hemoptysis, DOE , significant snoring,apnea  Gastrointestinal: No heartburn,dysphagia,abdominal pain, nausea / vomiting,rectal bleeding, melena,change in bowels Genitourinary: No dysuria,hematuria, pyuria,  incontinence, nocturia Musculoskeletal: No joint stiffness, joint swelling, weakness,pain   Physical exam:  Pertinent or positive findings: She appears younger than stated age. She is very lethargic and speech is slurred and voice low. Ptosis is present. Eyebrows are absent. The left mouth droops, left nasolabial fold is decreased. Heart sounds are distant. She has a grade 1 systolic murmur. She has low-grade rales without increased work of breathing. Abdomen is protuberant.  Right foot is bandaged. Pes planus is present. Pedal pulses are decreased. She is symmetrically weak but much more in the lower extremities than in the upper extremities. She has minimal lower extremity movement. She has hyperpigmented scarring in irregular distribution of the left shin.  General appearance:Adequately nourished; no acute distress   Lymphatic: No lymphadenopathy about the head, neck, axilla . Eyes: No conjunctival inflammation or lid edema is present. There is no scleral icterus. Ears:  External ear exam shows no significant lesions or deformities.   Nose:  External nasal examination shows no deformity or  inflammation. Nasal mucosa are pink and moist  without lesions ,exudates Oral exam: lips and gums are healthy appearing.There is no oropharyngeal erythema or exudate . Neck:  No thyromegaly, masses, tenderness noted.    Heart:  Normal rate and regular rhythm. S1 and S2 normal without gallop, click, rub .  Abdomen:Bowel sounds are normal. Abdomen is soft and nontender with no organomegaly, hernias,masses. GU: deferred  Extremities:  No cyanosis, clubbing,edema  Neurologic exam : Balance,Rhomberg,finger to nose testing could not be completed due to clinical state Skin: Warm & dry w/o tenting. No significant rash.  See clinical summary under each active problem in the Problem List with associated updated therapeutic plan

## 2016-02-24 ENCOUNTER — Other Ambulatory Visit: Payer: Self-pay | Admitting: Internal Medicine

## 2016-02-24 LAB — CULTURE, BLOOD (ROUTINE X 2): Culture: NO GROWTH

## 2016-02-24 LAB — TSH: TSH: 1.5 u[IU]/mL (ref 0.41–5.90)

## 2016-02-25 LAB — CULTURE, BLOOD (SINGLE): Culture: NO GROWTH

## 2016-02-29 LAB — CBC AND DIFFERENTIAL
HEMATOCRIT: 39 % (ref 36–46)
Hemoglobin: 12.5 g/dL (ref 12.0–16.0)
PLATELETS: 305 10*3/uL (ref 150–399)
WBC: 10.3 10^3/mL

## 2016-02-29 LAB — BASIC METABOLIC PANEL
BUN: 14 mg/dL (ref 4–21)
CREATININE: 1 mg/dL (ref 0.5–1.1)
Glucose: 116 mg/dL
Potassium: 3.2 mmol/L — AB (ref 3.4–5.3)
Sodium: 137 mmol/L (ref 137–147)

## 2016-03-08 ENCOUNTER — Other Ambulatory Visit: Payer: Self-pay | Admitting: *Deleted

## 2016-03-08 ENCOUNTER — Non-Acute Institutional Stay (SKILLED_NURSING_FACILITY): Payer: Medicare Other | Admitting: Nurse Practitioner

## 2016-03-08 DIAGNOSIS — F0280 Dementia in other diseases classified elsewhere without behavioral disturbance: Secondary | ICD-10-CM

## 2016-03-08 DIAGNOSIS — A419 Sepsis, unspecified organism: Secondary | ICD-10-CM | POA: Diagnosis not present

## 2016-03-08 DIAGNOSIS — G629 Polyneuropathy, unspecified: Secondary | ICD-10-CM

## 2016-03-08 DIAGNOSIS — E876 Hypokalemia: Secondary | ICD-10-CM

## 2016-03-08 DIAGNOSIS — N390 Urinary tract infection, site not specified: Secondary | ICD-10-CM | POA: Diagnosis not present

## 2016-03-08 DIAGNOSIS — F419 Anxiety disorder, unspecified: Secondary | ICD-10-CM

## 2016-03-08 MED ORDER — LORAZEPAM 0.5 MG PO TABS: ORAL_TABLET | ORAL | 0 refills | Status: DC
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
## 2016-03-08 NOTE — Telephone Encounter (Signed)
[facility] [phone] Fax: [phone]  

## 2016-03-08 NOTE — Addendum Note (Signed)
Addended by: Nelda SevereMAY, Tawnia Schirm A on: 03/08/2016 09:58 AM   Modules accepted: Orders

## 2016-03-13 LAB — BASIC METABOLIC PANEL
BUN: 11 mg/dL (ref 4–21)
Creatinine: 0.7 mg/dL (ref 0.5–1.1)
Glucose: 134 mg/dL
POTASSIUM: 4 mmol/L (ref 3.4–5.3)
SODIUM: 137 mmol/L (ref 137–147)

## 2016-03-21 ENCOUNTER — Encounter: Payer: Self-pay | Admitting: Internal Medicine

## 2016-03-21 ENCOUNTER — Non-Acute Institutional Stay (SKILLED_NURSING_FACILITY): Payer: Medicare Other | Admitting: Internal Medicine

## 2016-03-21 DIAGNOSIS — F339 Major depressive disorder, recurrent, unspecified: Secondary | ICD-10-CM | POA: Insufficient documentation

## 2016-03-21 DIAGNOSIS — R630 Anorexia: Secondary | ICD-10-CM

## 2016-03-21 DIAGNOSIS — R627 Adult failure to thrive: Secondary | ICD-10-CM | POA: Diagnosis not present

## 2016-03-21 NOTE — Progress Notes (Signed)
    Facility Location: [facility]  Room Number: 213 A  Code Status: DNR  This is a nursing facility follow up for specific acute issue of anorexia.  Interim medical record and care since last [facility] visit was updated with review of diagnostic studies and change in clinical status since last visit were documented.  HPI: The patient has eaten poorly over the last 3 weeks. She's also intermittently refuses take her medicines even though she does have right shoulder pain which is chronic. She has tramadol 50 mg ordered every 6 hours. She's been on appetite stimulant without any benefit clinically. She is also on Duloxetine 60 mg daily; she denies depression but her daughter feels she is depressed. TSH was therapeutic in February. She is on iron, hematocrit was 39 on 02/29/16. The daughter is considering palliative care. The patient is a DO NOT RESUSCITATE  Review of systems: Dementia invalidated responses. She was able to identify her daughter but could not give me her daughter's birthdate. She could not give me the date but could name Trump as present. She did remember that she worked as a Manufacturing engineerradio announcer [place]   Constitutional: No fever,significant weight change, fatigue  Cardiovascular: No chest pain, palpitations,paroxysmal nocturnal dyspnea, claudication, edema  Respiratory: No cough, sputum production,hemoptysis, DOE , significant snoring,apnea  Gastrointestinal: No heartburn,dysphagia,abdominal pain, nausea / vomiting,rectal bleeding, melena,change in bowels Genitourinary: No dysuria,hematuria, pyuria,  incontinence, nocturia Dermatologic: No rash, pruritus, change in appearance of skin Neurologic: No dizziness,headache,syncope, seizures, numbness , tingling Psychiatric: No significant anxiety , depression, insomnia, anorexia Endocrine: No change in hair/skin/ nails, excessive thirst, excessive hunger, excessive urination    Hematologic/lymphatic: No significant bruising, lymphadenopathy,abnormal bleeding Allergy/immunology: No itchy/ watery eyes, significant sneezing, urticaria, angioedema  Physical exam:  Pertinent or positive findings: affect is flat. Heart rhythm is slightly irregular. She has minor rales anteriorly without increased work of breathing. Pedal pulses are decreased. Marked fusiform changes of the knees. She has extensive operative scars of the knees bilaterally.   there is interosseous wasting. Symmetric weakness is present in the upper and lower extremities.   General appearance:Adequately nourished; no acute distress , increased work of breathing is present.   Lymphatic: No lymphadenopathy about the head, neck, axilla . Eyes: No conjunctival inflammation or lid edema is present. There is no scleral icterus. Ears:  External ear exam shows no significant lesions or deformities.   Nose:  External nasal examination shows no deformity or inflammation. Nasal mucosa are pink and moist without lesions ,exudates Oral exam: lips and gums are healthy appearing.There is no oropharyngeal erythema or exudate . Neck:  No thyromegaly, masses, tenderness noted.    Heart:  No gallop, murmur, click, rub .  Abdomen: protuberant. Bowel sounds are normal. Abdomen is soft and nontender with no organomegaly, hernias,masses. GU: deferred  Extremities:  No cyanosis, clubbing,edema  Neurologic exam : Balance,Rhomberg,finger to nose testing could not be completed due to clinical state Deep tendon reflexes are equal in UE Skin: Warm & dry w/o tenting. No significant lesions or rash.   #1 anorexia #2 probable depression  Plan: See changes in medications palliative care consult

## 2016-03-21 NOTE — Patient Instructions (Addendum)
See assessment and plan under each diagnosis acutely for this visit Total time 29 minutes; greater than 50% of the visit spent counseling daugher and coordinating care for problems addressed at this encounter

## 2016-03-22 LAB — BASIC METABOLIC PANEL
BUN: 17 mg/dL (ref 4–21)
Creatinine: 0.6 mg/dL (ref 0.5–1.1)
Glucose: 122 mg/dL
Potassium: 3.9 mmol/L (ref 3.4–5.3)
Sodium: 136 mmol/L — AB (ref 137–147)

## 2016-04-10 ENCOUNTER — Encounter: Payer: Self-pay | Admitting: Nurse Practitioner

## 2016-04-10 ENCOUNTER — Non-Acute Institutional Stay (SKILLED_NURSING_FACILITY): Payer: Medicare Other | Admitting: Nurse Practitioner

## 2016-04-10 DIAGNOSIS — R627 Adult failure to thrive: Secondary | ICD-10-CM

## 2016-04-10 DIAGNOSIS — G629 Polyneuropathy, unspecified: Secondary | ICD-10-CM

## 2016-04-10 DIAGNOSIS — F0280 Dementia in other diseases classified elsewhere without behavioral disturbance: Secondary | ICD-10-CM | POA: Diagnosis not present

## 2016-04-10 DIAGNOSIS — F419 Anxiety disorder, unspecified: Secondary | ICD-10-CM | POA: Diagnosis not present

## 2016-04-10 DIAGNOSIS — I480 Paroxysmal atrial fibrillation: Secondary | ICD-10-CM
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
## 2016-05-03 ENCOUNTER — Encounter: Payer: Self-pay | Admitting: Nurse Practitioner

## 2016-05-03 ENCOUNTER — Non-Acute Institutional Stay (SKILLED_NURSING_FACILITY): Payer: Medicare Other | Admitting: Nurse Practitioner

## 2016-05-03 DIAGNOSIS — R627 Adult failure to thrive: Secondary | ICD-10-CM

## 2016-05-03 DIAGNOSIS — K5901 Slow transit constipation: Secondary | ICD-10-CM

## 2016-05-03 DIAGNOSIS — F0280 Dementia in other diseases classified elsewhere without behavioral disturbance: Secondary | ICD-10-CM

## 2016-05-03 DIAGNOSIS — G629 Polyneuropathy, unspecified: Secondary | ICD-10-CM | POA: Diagnosis not present

## 2016-05-03 DIAGNOSIS — F339 Major depressive disorder, recurrent, unspecified: Secondary | ICD-10-CM | POA: Diagnosis not present

## 2016-05-03 DIAGNOSIS — I480 Paroxysmal atrial fibrillation: Secondary | ICD-10-CM | POA: Diagnosis not present
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
## 2016-05-31 ENCOUNTER — Encounter: Payer: Self-pay | Admitting: Nurse Practitioner

## 2016-05-31 ENCOUNTER — Non-Acute Institutional Stay (SKILLED_NURSING_FACILITY): Payer: Medicare Other | Admitting: Nurse Practitioner

## 2016-05-31 DIAGNOSIS — G629 Polyneuropathy, unspecified: Secondary | ICD-10-CM | POA: Diagnosis not present

## 2016-05-31 DIAGNOSIS — F0280 Dementia in other diseases classified elsewhere without behavioral disturbance: Secondary | ICD-10-CM

## 2016-05-31 DIAGNOSIS — R627 Adult failure to thrive: Secondary | ICD-10-CM

## 2016-05-31 DIAGNOSIS — F339 Major depressive disorder, recurrent, unspecified: Secondary | ICD-10-CM | POA: Diagnosis not present

## 2016-05-31 DIAGNOSIS — I480 Paroxysmal atrial fibrillation: Secondary | ICD-10-CM
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
## 2016-06-20 ENCOUNTER — Encounter: Payer: Self-pay | Admitting: Nurse Practitioner

## 2016-06-20 DIAGNOSIS — R627 Adult failure to thrive: Secondary | ICD-10-CM | POA: Insufficient documentation

## 2016-06-28 ENCOUNTER — Non-Acute Institutional Stay (SKILLED_NURSING_FACILITY): Payer: Medicare Other | Admitting: Nurse Practitioner

## 2016-06-28 ENCOUNTER — Encounter: Payer: Self-pay | Admitting: Adult Health

## 2016-06-28 ENCOUNTER — Encounter: Payer: Self-pay | Admitting: Nurse Practitioner

## 2016-06-28 DIAGNOSIS — F339 Major depressive disorder, recurrent, unspecified: Secondary | ICD-10-CM

## 2016-06-28 DIAGNOSIS — I5032 Chronic diastolic (congestive) heart failure: Secondary | ICD-10-CM

## 2016-06-28 DIAGNOSIS — R627 Adult failure to thrive: Secondary | ICD-10-CM | POA: Diagnosis not present

## 2016-06-28 DIAGNOSIS — I480 Paroxysmal atrial fibrillation: Secondary | ICD-10-CM

## 2016-06-28 DIAGNOSIS — K5901 Slow transit constipation: Secondary | ICD-10-CM

## 2016-06-28 DIAGNOSIS — F0280 Dementia in other diseases classified elsewhere without behavioral disturbance: Secondary | ICD-10-CM
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
## 2016-06-29 ENCOUNTER — Non-Acute Institutional Stay (SKILLED_NURSING_FACILITY): Payer: Medicare Other | Admitting: Internal Medicine

## 2016-06-29 ENCOUNTER — Encounter: Payer: Self-pay | Admitting: Internal Medicine

## 2016-06-29 DIAGNOSIS — H019 Unspecified inflammation of eyelid: Secondary | ICD-10-CM

## 2016-06-29 NOTE — Progress Notes (Signed)
   NURSING HOME LOCATION:  [facility] ROOM NUMBER:  226-B  CODE STATUS:  DNR  PCP:  Pecola LawlessHopper, Myriah Boggus F, MD  [address] [place] [id number]  This is a nursing facility follow up for specific acute issue of reported itching and redness of the left lower lid.  Interim medical record and care since last [facility] visit was updated with review of diagnostic studies and change in clinical status since last visit were documented.  HPI: The patient does have dementia and  denies any active infectious, extrinsic, or ophthalmologic symptoms. Her only complaint is dry lips.  Review of systems: Dementia invalidated responses. She can give me her birthdate but not the date.  Constitutional: No fever,significant weight change, fatigue  Eyes: No redness, discharge, pain, vision change ENT/mouth: No nasal congestion,  purulent discharge, earache,change in hearing ,sore throat  Cardiovascular: No chest pain, palpitations,paroxysmal nocturnal dyspnea, claudication, edema  Respiratory: No cough, sputum production,hemoptysis, DOE , significant snoring,apnea   Allergy/immunology: No itchy/ watery eyes, significant sneezing, urticaria, angioedema  Physical exam:  Pertinent or positive findings: Eyebrows are absent. There is scarring of the left external nose and left upper lip. There is minimal hyperpigmentation of the left lower lid compared to the right. There is no associated cellulitis or conjunctivitis. Although she has no pulmonary symptoms she does exhibit dry rales with inspiration with almost rublike character. Heart sounds are distant.  General appearance:Adequately nourished; no acute distress , increased work of breathing is present.   Lymphatic: No lymphadenopathy about the head, neck, axilla . Eyes: No conjunctival inflammation or lid edema is present. There is no scleral icterus. Ears:  External ear exam shows no significant lesions or deformities.   Nose:  External nasal  examination shows no deformity or inflammation. Nasal mucosa are pink and moist without lesions ,exudates Oral exam: lips and gums are healthy appearing.There is no oropharyngeal erythema or exudate . Neck:  No thyromegaly, masses, tenderness noted.    Heart:  Normal rate and regular rhythm. S1 and S2 normal without gallop, murmur, click, rub .  Skin: Warm & dry w/o tenting. No significant lesions or rash.  #1 possible dermatitis left lower lid without evidence of conjunctivitis or cellulitis  Plan: Cort Aid twice a day to the lid 5 days

## 2016-06-29 NOTE — Patient Instructions (Signed)
Apply Cort Aid OTC twice a day to the involved tissues. Do not get this topical steroid into eyes.

## 2016-07-26 ENCOUNTER — Non-Acute Institutional Stay (SKILLED_NURSING_FACILITY): Payer: Medicare Other | Admitting: Adult Health

## 2016-07-26 ENCOUNTER — Encounter: Payer: Self-pay | Admitting: Adult Health

## 2016-07-26 DIAGNOSIS — F339 Major depressive disorder, recurrent, unspecified: Secondary | ICD-10-CM

## 2016-07-26 DIAGNOSIS — I480 Paroxysmal atrial fibrillation: Secondary | ICD-10-CM

## 2016-07-26 DIAGNOSIS — F0391 Unspecified dementia with behavioral disturbance: Secondary | ICD-10-CM | POA: Diagnosis not present

## 2016-07-26 DIAGNOSIS — B372 Candidiasis of skin and nail: Secondary | ICD-10-CM | POA: Diagnosis not present

## 2016-07-26 DIAGNOSIS — F39 Unspecified mood [affective] disorder: Secondary | ICD-10-CM | POA: Diagnosis not present

## 2016-07-26 DIAGNOSIS — G629 Polyneuropathy, unspecified: Secondary | ICD-10-CM | POA: Diagnosis not present

## 2016-07-26 DIAGNOSIS — I5032 Chronic diastolic (congestive) heart failure: Secondary | ICD-10-CM | POA: Diagnosis not present

## 2016-07-26 NOTE — Progress Notes (Signed)
DATE:  07/26/2016   MRN:  [id number]  BIRTHDAY: 30-Aug-1919  Facility:  Nursing Home Location:  [facility] Room Number: 226-B  LEVEL OF CARE:  SNF (31)  Contact Information    Name Relation Home Work Mobile   [place] Daughter [phone]  [phone]   [facility] Daughter   [phone]   Tjoa,Alexsis Daughter [phone]  [phone]       Code Status History    Date Active Date Inactive Code Status Order ID Comments User Context   02/16/2016  8:57 AM 02/21/2016  7:52 PM DNR [id number]  Calvert Cantor, MD Inpatient   02/15/2016 10:16 PM 02/16/2016  8:57 AM Full Code [id number]  Bobette Mo, MD Inpatient   05/24/2014  5:18 AM 06/01/2014  6:05 PM Full Code [id number]  Ron Parker, MD Inpatient   08/02/2011 11:16 PM 08/07/2011  6:03 PM Full Code [id number]  Rica Mast, RN ED    Questions for Most Recent Historical Code Status (Order [id number])    Question Answer Comment   In the event of cardiac or respiratory ARREST Do not call a "code blue"    In the event of cardiac or respiratory ARREST Do not perform Intubation, CPR, defibrillation or ACLS    In the event of cardiac or respiratory ARREST Use medication by any route, position, wound care, and other measures to relive pain and suffering. May use oxygen, suction and manual treatment of airway obstruction as needed for comfort.         Advance Directive Documentation     Most Recent Value  Type of Advance Directive  Out of facility DNR (pink MOST or yellow form)  Pre-existing out of facility DNR order (yellow form or pink MOST form)  -  "MOST" Form in Place?  -       Chief Complaint  Patient presents with  . Medical Management of Chronic Issues    Routine visit    HISTORY OF PRESENT ILLNESS:  This is a 29-YO female seen for a routine visit.  She is a long-term care resident of [facility]. She was seen in She has PMH of  the room today. She  verbalized being happy to have talked to someone today. She was recently started on Depakote for mood disorder.  She is also being followed up by hospice. She has PMH of complete heart block, pacemaker placement, syncope, dementia, peripheral neuropathy, depression and constipation.     PAST MEDICAL HISTORY:  Past Medical History:  Diagnosis Date  . Arthritis    "all over kind of"  . Complete heart block (HCC)   . Dementia 08/02/11   Delene Loll; "forgetful, repeating"  . History of blood transfusion    "only w/operations"  . Pacemaker 2002 and Oct 2009   FOR COMPLETE HEART BLOCK....MEDTRONIC ADAPTA R01  . Peripheral neuropathy   . Syncope and collapse 08/02/11   "first time ever"  . Visual problems    "very limited in right eye"     CURRENT MEDICATIONS: Reviewed  Patient's Medications  New Prescriptions   No medications on file  Previous Medications   ACETAMINOPHEN (TYLENOL) 325 MG TABLET    Take 650 mg by mouth every 6 (six) hours as needed for moderate pain.   AMINO ACIDS-PROTEIN HYDROLYS (FEEDING SUPPLEMENT, PRO-STAT SUGAR FREE 64,) LIQD    Take 30 mLs by mouth 3 (three) times daily with meals.   ASPIRIN 325 MG TABLET    Take  325 mg by mouth daily.   DIVALPROEX (DEPAKOTE) 250 MG DR TABLET    Take 250 mg by mouth 2 (two) times daily.    FUROSEMIDE (LASIX) 40 MG TABLET    Take 1 tablet (40 mg total) by mouth daily.   GABAPENTIN (NEURONTIN) 100 MG CAPSULE    Take 100 mg by mouth 3 (three) times daily.    LACTULOSE (CHRONULAC) 10 GM/15ML SOLUTION    Take 30 g by mouth 2 (two) times daily as needed for mild constipation.   LORAZEPAM (ATIVAN) 0.5 MG TABLET    Take 0.25 mg by mouth every 6 (six) hours as needed for anxiety. Take 1/2 tablet to = 0.25 mg   METOPROLOL TARTRATE (LOPRESSOR) 25 MG TABLET    Take 0.5 tablets (12.5 mg total) by mouth 2 (two) times daily.   NUTRITIONAL SUPPLEMENTS PO    Take 1 each by mouth 2 (two) times daily. Health Shake   NYSTATIN (MYCOSTATIN/NYSTOP)  POWDER    Apply topically daily as needed (candidiasis). Apply 1 application topically daily PRN   POLYETHYLENE GLYCOL (MIRALAX / GLYCOLAX) PACKET    Take 17 g by mouth daily as needed for mild constipation.   POLYVINYL ALCOHOL (LIQUIFILM TEARS) 1.4 % OPHTHALMIC SOLUTION    Place 2 drops into the left eye 4 (four) times daily.   POTASSIUM CHLORIDE 20 MEQ/15ML (10%) SOLN    Take 20 mEq by mouth daily.   SENNOSIDES-DOCUSATE SODIUM (SENOKOT-S) 8.6-50 MG TABLET    Take 2 tablets by mouth daily.   TRAMADOL (ULTRAM) 50 MG TABLET    Take 50 mg by mouth every 6 (six) hours as needed for severe pain.   VENLAFAXINE (EFFEXOR) 75 MG TABLET    Take 75 mg by mouth daily.  Modified Medications   No medications on file  Discontinued Medications   AMBULATORY NON FORMULARY MEDICATION    Give 1 Health Shake by mouth twice daily.   DULOXETINE (CYMBALTA) 30 MG CAPSULE    Take 30 mg by mouth daily.   POTASSIUM CHLORIDE SA (K-DUR,KLOR-CON) 20 MEQ TABLET    Take 20 mEq by mouth daily.   SENNA (SENOKOT) 8.6 MG TABLET    Take 2 tablets by mouth 2 (two) times daily.   SENNA-DOCUSATE (SENOKOT-S) 8.6-50 MG TABLET    Take 2 tablets by mouth 2 (two) times daily.     Allergies  Allergen Reactions  . Latex Itching, Rash and Other (See Comments)    Bandaging and tape w/adhesive "sometimes get rash; sometimes pretty bad" Causes redness      REVIEW OF SYSTEMS:  Unable to obtain due to advanced dementia    PHYSICAL EXAMINATION  GENERAL APPEARANCE: Well nourished. In no acute distress. SKIN:  Erythematous rashes under bilateral breast, right foot unstageable DTI HEAD: Normal in size and contour. No evidence of trauma EYES: Lids open and close normally. No blepharitis, entropion or ectropion. PERRL. Conjunctivae are clear and sclerae are white. Lenses are without opacity EARS: Pinnae are normal. Patient hears normal voice tunes of the examiner MOUTH and THROAT: Lips are without lesions. Oral mucosa is moist and  without lesions. Tongue is normal in shape, size, and color and without lesions NECK: supple, trachea midline, no neck masses, no thyroid tenderness, no thyromegaly LYMPHATICS: no LAN in the neck, no supraclavicular LAN RESPIRATORY: breathing is even & unlabored, BS CTAB CARDIAC: RRR, no murmur,no extra heart sounds, no edema GI: abdomen soft, normal BS, no masses, no tenderness, no hepatomegaly, no splenomegaly EXTREMITIES:  Able to move X 4 extremities, limited ROM on bilateral shoulders, BLE has generalized weakness PSYCHIATRIC: Alert to self, disoriented to time and place. Affect and behavior are appropriate   LABS/RADIOLOGY: Labs reviewed: Basic Metabolic Panel:  Recent Labs  16/10/9599/30/18 2032  02/18/16 0338 02/20/16 1029 02/21/16 0458 02/29/16 03/13/16 03/22/16  NA  --   < > 137 136 137 137 137 136*  K  --   < > 3.4* 3.4* 3.4* 3.2* 4.0 3.9  CL  --   < > 106 102 101  --   --   --   CO2  --   < > 20* 23 27  --   --   --   GLUCOSE  --   < > 120* 165* 122*  --   --   --   BUN  --   < > 32* 36* 30* 14 11 17   CREATININE  --   < > 1.02* 1.08* 0.94 1.0 0.7 0.6  CALCIUM  --   < > 8.0* 8.3* 8.2*  --   --   --   MG 1.8  --   --   --   --   --   --   --   PHOS 2.7  --   --   --   --   --   --   --   < > = values in this interval not displayed. Liver Function Tests:  Recent Labs  02/15/16 1555 02/16/16 0327  AST 50* 33  ALT 21 17  ALKPHOS 77 52  BILITOT 0.9 0.7  PROT 8.3* 6.5  ALBUMIN 3.6 2.7*   CBC:  Recent Labs  02/16/16 0327 02/17/16 0338 02/18/16 0338 02/20/16 1029 02/29/16  WBC 33.2* 32.3* 27.2* 18.2* 10.3  NEUTROABS 29.6* 28.2* 24.2*  --   --   HGB 10.5* 11.6* 12.0 12.3 12.5  HCT 32.5* 35.5* 36.1 36.7 39  MCV 95.3 94.2 92.3 86.8  --   PLT 260 277 244 279 305   Cardiac Enzymes:  Recent Labs  02/15/16 2032 02/16/16 0327 02/16/16 1525  TROPONINI 1.30* 1.26* 1.33*   CBG:  Recent Labs  02/15/16 1550  GLUCAP 149*    ASSESSMENT/PLAN:  1. Candidal skin  infection - currently standing 100,000 units/gram ointment topically under bilateral breast every shift 2 weeks   2. Chronic diastolic heart failure (HCC) - no SOB; continue Lasix 40 mg 1 tab by mouth daily and metoprolol tartrate 25 mg give 1/2 tab = 12.5 mg by mouth twice a day and KCl 10% 20 meq/15 mL by mouth daily; check BMP   3. Paroxysmal atrial fibrillation (HCC) - rate controlled; continue metoprolol tartrate 25 mg give 1/2 tab = 12.5 mg by mouth twice a day and aspirin EC 325 mg 1 tab by mouth daily   4. Recurrent major depressive disorder, remission status unspecified (HCC) - continue venlafaxine ER 75 mg 1 capsule by mouth daily; followed up by [facility] NP   5. Dementia with behavioral disturbance, unspecified dementia type - continue supportive care, followed up by hospice; fall precautions; check CBC   6. Mood disorder (HCC) - mood is stable; recently started on divalproex 250 mg 1 tab by mouth twice a day   7. Neuropathy - stable; continue gabapentin 100 mg 1 capsule by mouth TID     Goals of care:  Long-term care/Hospice    Monina C. Medina-Vargas - NP    [facility] [phone]

## 2016-08-08 ENCOUNTER — Non-Acute Institutional Stay (SKILLED_NURSING_FACILITY): Payer: Medicare Other | Admitting: Adult Health

## 2016-08-08 ENCOUNTER — Encounter: Payer: Self-pay | Admitting: Adult Health

## 2016-08-08 DIAGNOSIS — G629 Polyneuropathy, unspecified: Secondary | ICD-10-CM

## 2016-08-08 DIAGNOSIS — F0391 Unspecified dementia with behavioral disturbance: Secondary | ICD-10-CM | POA: Diagnosis not present

## 2016-08-08 DIAGNOSIS — L03119 Cellulitis of unspecified part of limb: Secondary | ICD-10-CM | POA: Diagnosis not present

## 2016-08-08 NOTE — Progress Notes (Signed)
DATE:  08/08/2016   MRN:  [id number]  BIRTHDAY: [birth year]  Facility:  Nursing Home Location:  [facility] Room Number: 226-B  LEVEL OF CARE:  SNF (31)  Contact Information    Name Relation Home Work Mobile   [place] Daughter [phone]  [phone]   [facility] Daughter   [phone]   Tjoa,Alexsis Daughter [phone]  [phone]       Code Status History    Date Active Date Inactive Code Status Order ID Comments User Context   02/16/2016  8:57 AM 02/21/2016  7:52 PM DNR [id number]  Calvert Cantor, MD Inpatient   02/15/2016 10:16 PM 02/16/2016  8:57 AM Full Code [id number]  Bobette Mo, MD Inpatient   05/24/2014  5:18 AM 06/01/2014  6:05 PM Full Code [id number]  Ron Parker, MD Inpatient   08/02/2011 11:16 PM 08/07/2011  6:03 PM Full Code [id number]  Rica Mast, RN ED    Questions for Most Recent Historical Code Status (Order [id number])    Question Answer Comment   In the event of cardiac or respiratory ARREST Do not call a "code blue"    In the event of cardiac or respiratory ARREST Do not perform Intubation, CPR, defibrillation or ACLS    In the event of cardiac or respiratory ARREST Use medication by any route, position, wound care, and other measures to relive pain and suffering. May use oxygen, suction and manual treatment of airway obstruction as needed for comfort.         Advance Directive Documentation     Most Recent Value  Type of Advance Directive  Out of facility DNR (pink MOST or yellow form)  Pre-existing out of facility DNR order (yellow form or pink MOST form)  -  "MOST" Form in Place?  -       Chief Complaint  Patient presents with  . Acute Visit    Bilateral lower extremity cellulitis    HISTORY OF PRESENT ILLNESS:  This is a 89-YO female seen for an acute visit secondary to bilateral lower extremity pain. She was seen in the room with 2 hospice nurses @ bedside. She was noted to have  erythematous rashes on BLE, from feet to thighs. No open wound noted but has dry scabs on right foot. Noted skin to be warm. Patient verbalized not feeling well. She is a long-term resident of [facility].  She is also being followed by [facility].  She has a PMH of complete heart block, pacemaker placement, syncope, dementia, peripheral neuropathy, depression and constipation.    PAST MEDICAL HISTORY:  Past Medical History:  Diagnosis Date  . Arthritis    "all over kind of"  . Complete heart block (HCC)   . Dementia 08/02/11   Delene Loll; "forgetful, repeating"  . History of blood transfusion    "only w/operations"  . Pacemaker 2002 and Oct 2009   FOR COMPLETE HEART BLOCK....MEDTRONIC ADAPTA R01  . Peripheral neuropathy   . Syncope and collapse 08/02/11   "first time ever"  . Visual problems    "very limited in right eye"     CURRENT MEDICATIONS: Reviewed  Patient's Medications  New Prescriptions   No medications on file  Previous Medications   ACETAMINOPHEN (TYLENOL) 325 MG TABLET    Take 650 mg by mouth every 6 (six) hours as needed for moderate pain.   AMINO ACIDS-PROTEIN HYDROLYS (FEEDING SUPPLEMENT, PRO-STAT SUGAR FREE 64,) LIQD    Take 30 mLs  by mouth 3 (three) times daily with meals.   ASPIRIN 325 MG TABLET    Take 325 mg by mouth daily.   DIVALPROEX (DEPAKOTE) 250 MG DR TABLET    Take 250 mg by mouth 2 (two) times daily.    FUROSEMIDE (LASIX) 40 MG TABLET    Take 1 tablet (40 mg total) by mouth daily.   GABAPENTIN (NEURONTIN) 100 MG CAPSULE    Take 100 mg by mouth 3 (three) times daily.    LACTULOSE (CHRONULAC) 10 GM/15ML SOLUTION    Take 30 g by mouth 2 (two) times daily as needed for mild constipation.   LORAZEPAM (ATIVAN) 0.5 MG TABLET    Take 0.25 mg by mouth every 6 (six) hours as needed for anxiety. Take 1/2 tablet to = 0.25 mg   METOPROLOL TARTRATE (LOPRESSOR) 25 MG TABLET    Take 0.5 tablets (12.5 mg total) by mouth 2 (two) times daily.    NUTRITIONAL SUPPLEMENTS PO    Take 1 each by mouth 2 (two) times daily. Health Shake   NYSTATIN (MYCOSTATIN/NYSTOP) POWDER    Apply topically daily as needed (candidiasis). Apply 1 application topically daily PRN   POLYETHYLENE GLYCOL (MIRALAX / GLYCOLAX) PACKET    Take 17 g by mouth daily as needed for mild constipation.   POLYVINYL ALCOHOL (LIQUIFILM TEARS) 1.4 % OPHTHALMIC SOLUTION    Place 2 drops into the left eye 4 (four) times daily.   POTASSIUM CHLORIDE 20 MEQ/15ML (10%) SOLN    Take 20 mEq by mouth daily.   SENNOSIDES-DOCUSATE SODIUM (SENOKOT-S) 8.6-50 MG TABLET    Take 2 tablets by mouth daily.   TRAMADOL (ULTRAM) 50 MG TABLET    Take 50 mg by mouth every 6 (six) hours as needed for severe pain.   VENLAFAXINE (EFFEXOR) 75 MG TABLET    Take 75 mg by mouth daily.  Modified Medications   No medications on file  Discontinued Medications   No medications on file     Allergies  Allergen Reactions  . Latex Itching, Rash and Other (See Comments)    Bandaging and tape w/adhesive "sometimes get rash; sometimes pretty bad" Causes redness      REVIEW OF SYSTEMS:  Unable to obtain due to advanced dementia    PHYSICAL EXAMINATION  GENERAL APPEARANCE: Well nourished.  SKIN:  Has erythematous rashes from bilateral toes up to the thighs HEAD: Normal in size and contour. No evidence of trauma MOUTH and THROAT: Lips are without lesions. Oral mucosa is moist and without lesions. Tongue is normal in shape, size, and color and without lesions NECK: supple, trachea midline, no neck masses, no thyroid tenderness, no thyromegaly LYMPHATICS: no LAN in the neck, no supraclavicular LAN RESPIRATORY: breathing is even & unlabored, BS CTAB CARDIAC: RRR, no murmur,no extra heart sounds, no edema GI: abdomen soft, normal BS, no masses, no tenderness, no hepatomegaly, no splenomegaly EXTREMITIES:  Able to move BUE, BLE not moving PSYCHIATRIC: Alert to self, disoriented to time and place. Affect and  behavior are appropriate   LABS/RADIOLOGY: Labs reviewed: Basic Metabolic Panel:  Recent Labs  16/10/96 2032  02/18/16 0338 02/20/16 1029 02/21/16 0458 02/29/16 03/13/16 03/22/16  NA  --   < > 137 136 137 137 137 136*  K  --   < > 3.4* 3.4* 3.4* 3.2* 4.0 3.9  CL  --   < > 106 102 101  --   --   --   CO2  --   < > 20*  23 27  --   --   --   GLUCOSE  --   < > 120* 165* 122*  --   --   --   BUN  --   < > 32* 36* 30* 14 11 17   CREATININE  --   < > 1.02* 1.08* 0.94 1.0 0.7 0.6  CALCIUM  --   < > 8.0* 8.3* 8.2*  --   --   --   MG 1.8  --   --   --   --   --   --   --   PHOS 2.7  --   --   --   --   --   --   --   < > = values in this interval not displayed. Liver Function Tests:  Recent Labs  02/15/16 1555 02/16/16 0327  AST 50* 33  ALT 21 17  ALKPHOS 77 52  BILITOT 0.9 0.7  PROT 8.3* 6.5  ALBUMIN 3.6 2.7*   CBC:  Recent Labs  02/16/16 0327 02/17/16 0338 02/18/16 0338 02/20/16 1029 02/29/16  WBC 33.2* 32.3* 27.2* 18.2* 10.3  NEUTROABS 29.6* 28.2* 24.2*  --   --   HGB 10.5* 11.6* 12.0 12.3 12.5  HCT 32.5* 35.5* 36.1 36.7 39  MCV 95.3 94.2 92.3 86.8  --   PLT 260 277 244 279 305   Cardiac Enzymes:  Recent Labs  02/15/16 2032 02/16/16 0327 02/16/16 1525  TROPONINI 1.30* 1.26* 1.33*   CBG:  Recent Labs  02/15/16 1550  GLUCAP 149*    ASSESSMENT/PLAN:  1. Cellulitis of lower extremity, unspecified laterality - start on Doxycycline 100 mg 1 tab PO BID X 10 days and Florastor 250 mg 1 capsule PO BID X 13 days, keep skin clean and dry  2. Neuropathy - continue Gabapentin 100 mg 1 capsule PO TID  3. Dementia with behavioral disturbance, unspecified dementia type - continue supportive care, fall precautions    [name] - NP    [facility] [phone]

## 2016-08-17 ENCOUNTER — Encounter: Payer: Self-pay | Admitting: Adult Health

## 2016-08-17 LAB — BASIC METABOLIC PANEL
BUN: 19 (ref 4–21)
CREATININE: 0.5 (ref 0.5–1.1)
Glucose: 99
Potassium: 4.2 (ref 3.4–5.3)
Sodium: 135 — AB (ref 137–147)

## 2016-08-17 LAB — CBC AND DIFFERENTIAL
HCT: 38 (ref 36–46)
Hemoglobin: 12.9 (ref 12.0–16.0)
Platelets: 361 (ref 150–399)
WBC: 14.6

## 2016-08-17 NOTE — Progress Notes (Signed)
This encounter was created in error - please disregard.

## 2016-08-21 LAB — CBC AND DIFFERENTIAL
HEMATOCRIT: 35 — AB (ref 36–46)
Hemoglobin: 11.4 — AB (ref 12.0–16.0)
PLATELETS: 403 — AB (ref 150–399)
WBC: 18.6

## 2016-09-08 ENCOUNTER — Encounter: Payer: Self-pay | Admitting: Adult Health

## 2016-09-08 ENCOUNTER — Non-Acute Institutional Stay (SKILLED_NURSING_FACILITY): Payer: Medicare Other | Admitting: Adult Health

## 2016-09-08 DIAGNOSIS — F39 Unspecified mood [affective] disorder: Secondary | ICD-10-CM

## 2016-09-08 DIAGNOSIS — F0390 Unspecified dementia without behavioral disturbance: Secondary | ICD-10-CM | POA: Diagnosis not present

## 2016-09-08 DIAGNOSIS — F419 Anxiety disorder, unspecified: Secondary | ICD-10-CM | POA: Diagnosis not present

## 2016-09-08 DIAGNOSIS — I480 Paroxysmal atrial fibrillation: Secondary | ICD-10-CM

## 2016-09-08 DIAGNOSIS — G629 Polyneuropathy, unspecified: Secondary | ICD-10-CM | POA: Diagnosis not present

## 2016-09-08 DIAGNOSIS — I5032 Chronic diastolic (congestive) heart failure: Secondary | ICD-10-CM | POA: Diagnosis not present

## 2016-09-08 NOTE — Progress Notes (Signed)
DATE:  09/08/2016   MRN:  [id number]  BIRTHDAY: Jul 22, 1919  Facility:  Nursing Home Location:  [facility] Room Number: 226-B  LEVEL OF CARE:  SNF (31)  Contact Information    Name Relation Home Work Mobile   [place] Daughter [phone]  [phone]   [facility] Daughter   [phone]   Tjoa,Alexsis Daughter [phone]  [phone]       Code Status History    Date Active Date Inactive Code Status Order ID Comments User Context   02/16/2016  8:57 AM 02/21/2016  7:52 PM DNR [id number]  Calvert Cantor, MD Inpatient   02/15/2016 10:16 PM 02/16/2016  8:57 AM Full Code [id number]  Bobette Mo, MD Inpatient   05/24/2014  5:18 AM 06/01/2014  6:05 PM Full Code [id number]  Ron Parker, MD Inpatient   08/02/2011 11:16 PM 08/07/2011  6:03 PM Full Code [id number]  Rica Mast, RN ED    Questions for Most Recent Historical Code Status (Order [id number])    Question Answer Comment   In the event of cardiac or respiratory ARREST Do not call a "code blue"    In the event of cardiac or respiratory ARREST Do not perform Intubation, CPR, defibrillation or ACLS    In the event of cardiac or respiratory ARREST Use medication by any route, position, wound care, and other measures to relive pain and suffering. May use oxygen, suction and manual treatment of airway obstruction as needed for comfort.         Advance Directive Documentation     Most Recent Value  Type of Advance Directive  Out of facility DNR (pink MOST or yellow form)  Pre-existing out of facility DNR order (yellow form or pink MOST form)  -  "MOST" Form in Place?  -       Chief Complaint  Patient presents with  . Medical Management of Chronic Issues    Routine visit    HISTORY OF PRESENT ILLNESS:  This is a 89-YO female seen for a routine visit.  She is a long-term care resident of [facility].  She is also followed by [facility].  She has a PMH of  complete heart block, syncope, pacemaker placement, peripheral neuropathy, dementia, depression, and constipation. She was seen in the room today while drinking. Her Roxanol dosage was recently adjusted. She is now on Roxanol 5 mg TID and PRN.  She did not verbalize pain today.  Ativan was recently ordered BID for anxiety.  Ultram and  monthly weights were recently discontinued.    PAST MEDICAL HISTORY:  Past Medical History:  Diagnosis Date  . Arthritis    "all over kind of"  . Complete heart block (HCC)   . Dementia 08/02/11   Delene Loll; "forgetful, repeating"  . History of blood transfusion    "only w/operations"  . Pacemaker 2002 and Oct 2009   FOR COMPLETE HEART BLOCK....MEDTRONIC ADAPTA R01  . Peripheral neuropathy   . Syncope and collapse 08/02/11   "first time ever"  . Visual problems    "very limited in right eye"     CURRENT MEDICATIONS: Reviewed  Patient's Medications  New Prescriptions   No medications on file  Previous Medications   ACETAMINOPHEN (TYLENOL) 325 MG TABLET    Take 650 mg by mouth every 6 (six) hours as needed for moderate pain.   AMINO ACIDS-PROTEIN HYDROLYS (FEEDING SUPPLEMENT, PRO-STAT SUGAR FREE 64,) LIQD    Take 30 mLs by  mouth 3 (three) times daily with meals.   DIVALPROEX (DEPAKOTE) 250 MG DR TABLET    Take 250 mg by mouth 2 (two) times daily.    FUROSEMIDE (LASIX) 40 MG TABLET    Take 1 tablet (40 mg total) by mouth daily.   GABAPENTIN (NEURONTIN) 100 MG CAPSULE    Take 100 mg by mouth 3 (three) times daily.    LORAZEPAM (ATIVAN) 0.5 MG TABLET    Take 0.25 mg by mouth. Take 1/2 tablet to = 0.25 mg, take q6h prn and BID scheduled.   METOPROLOL TARTRATE (LOPRESSOR) 25 MG TABLET    Take 0.5 tablets (12.5 mg total) by mouth 2 (two) times daily.   MORPHINE (ROXANOL) 20 MG/ML CONCENTRATED SOLUTION    Take 5 mg by mouth every 2 (two) hours as needed for severe pain.   MORPHINE 10 MG/5ML SOLUTION    Take 5 mg by mouth 3 (three) times daily.    NUTRITIONAL SUPPLEMENTS PO    Take 1 each by mouth 2 (two) times daily. Health Shake   NYSTATIN (MYCOSTATIN/NYSTOP) POWDER    Apply topically daily as needed (Candidiasis). 1 application qd prn   POLYETHYLENE GLYCOL (MIRALAX / GLYCOLAX) PACKET    Take 17 g by mouth daily as needed for mild constipation.   POLYVINYL ALCOHOL (LIQUIFILM TEARS) 1.4 % OPHTHALMIC SOLUTION    Place 2 drops into the left eye 4 (four) times daily.   POTASSIUM CHLORIDE 20 MEQ/15ML (10%) SOLN    Take 20 mEq by mouth daily.   SENNOSIDES-DOCUSATE SODIUM (SENOKOT-S) 8.6-50 MG TABLET    Take 3 tablets by mouth 2 (two) times daily.    TRAMADOL (ULTRAM) 50 MG TABLET    Take 50 mg by mouth every 6 (six) hours as needed for severe pain.   VENLAFAXINE (EFFEXOR) 37.5 MG TABLET    Take 37.5 mg by mouth daily.  Modified Medications   No medications on file  Discontinued Medications   ASPIRIN 325 MG TABLET    Take 325 mg by mouth daily.   LACTULOSE (CHRONULAC) 10 GM/15ML SOLUTION    Take 30 g by mouth 2 (two) times daily as needed for mild constipation.   VENLAFAXINE (EFFEXOR) 75 MG TABLET    Take 75 mg by mouth daily.     Allergies  Allergen Reactions  . Latex Itching, Rash and Other (See Comments)    Bandaging and tape w/adhesive "sometimes get rash; sometimes pretty bad" Causes redness      REVIEW OF SYSTEMS:  Unable to obtain due to dementia    PHYSICAL EXAMINATION  GENERAL APPEARANCE: Well nourished. In no acute distress.  SKIN:  Right foot and sacrum wound with dressing,  MOUTH and THROAT: Lips are without lesions. Oral mucosa is moist and without lesions. RESPIRATORY: breathing is even & unlabored, BS CTAB CARDIAC: RRR, no murmur,no extra heart sounds, no edema GI: abdomen soft, normal BS, no masses, no tenderness, no hepatomegaly, no splenomegaly EXTREMITIES:  Able to move BUE, does not move BLE PSYCHIATRIC: Alert to self, disoriented to time and place. Affect and behavior are  appropriate    LABS/RADIOLOGY: Labs reviewed: Basic Metabolic Panel:  Recent Labs  13/24/40 2032  02/18/16 0338 02/20/16 1029 02/21/16 0458  03/13/16 03/22/16 08-28-2016  NA  --   < > 137 136 137  < > 137 136* 135*  K  --   < > 3.4* 3.4* 3.4*  < > 4.0 3.9 4.2  CL  --   < > 106  102 101  --   --   --   --   CO2  --   < > 20* 23 27  --   --   --   --   GLUCOSE  --   < > 120* 165* 122*  --   --   --   --   BUN  --   < > 32* 36* 30*  < > 11 17 19   CREATININE  --   < > 1.02* 1.08* 0.94  < > 0.7 0.6 0.5  CALCIUM  --   < > 8.0* 8.3* 8.2*  --   --   --   --   MG 1.8  --   --   --   --   --   --   --   --   PHOS 2.7  --   --   --   --   --   --   --   --   < > = values in this interval not displayed. Liver Function Tests:  Recent Labs  02/15/16 1555 02/16/16 0327  AST 50* 33  ALT 21 17  ALKPHOS 77 52  BILITOT 0.9 0.7  PROT 8.3* 6.5  ALBUMIN 3.6 2.7*   CBC:  Recent Labs  02/16/16 0327 02/17/16 0338 02/18/16 0338 02/20/16 1029 02/29/16 09/03/2016 08/21/16  WBC 33.2* 32.3* 27.2* 18.2* 10.3 14.6 18.6  NEUTROABS 29.6* 28.2* 24.2*  --   --   --   --   HGB 10.5* 11.6* 12.0 12.3 12.5 12.9 11.4*  HCT 32.5* 35.5* 36.1 36.7 39 38 35*  MCV 95.3 94.2 92.3 86.8  --   --   --   PLT 260 277 244 279 305 361 403*   Cardiac Enzymes:  Recent Labs  02/15/16 2032 02/16/16 0327 02/16/16 1525  TROPONINI 1.30* 1.26* 1.33*   CBG:  Recent Labs  02/15/16 1550  GLUCAP 149*     ASSESSMENT/PLAN:  1. Paroxysmal atrial fibrillation (HCC) - rate-controlled, continue metoprolol tartrate 25 mg 1/2 tab = 12.5 mg twice a day   2. Anxiety - mood is stable; continue Ativan 0.5 mg 1/2 tab = 0.25 mg twice a day and every 6 hours when necessary   3. Dementia without behavioral disturbance, unspecified dementia type - continue supportive care; fall precautions   4. Chronic diastolic heart failure (HCC) - no SOB; continue Lasix 40 mg 1 tab daily, KCL  10%  20 meq daily and metoprolol tartrate  25 mg 1/2 tab = 12.5 mg BID   5. Mood disorder (HCC) - continue Depakote DR 125 mg sprinkle 2 capsules daily at bedtime   6. Neuropathy - continue gabapentin 100 mg 1 capsule TID   7. Chronic pain - continue morphine 20 mg/ML gave 5 mg/0.25 mL3 times a day and every 2hours when necessary     Goals of care:  Long-term care/Hospice    Monina C. Medina-Vargas - NP    [employer] [phone]

## 2016-09-12 ENCOUNTER — Non-Acute Institutional Stay (SKILLED_NURSING_FACILITY): Payer: Medicare Other

## 2016-09-12 DIAGNOSIS — Z0000 Encounter for general adult medical examination without abnormal findings: Secondary | ICD-10-CM | POA: Diagnosis not present

## 2016-09-12 NOTE — Patient Instructions (Signed)
[name] , Thank you for taking time to come for your Medicare Wellness Visit. I appreciate your ongoing commitment to your health goals. Please review the following plan we discussed and let me know if I can assist you in the future.   Screening recommendations/referrals: Colonoscopy excluded, pt over age 81 Mammogram excluded, pt over age 39 Bone Density excluded Recommended yearly ophthalmology/optometry visit for glaucoma screening and checkup Recommended yearly dental visit for hygiene and checkup  Vaccinations: Influenza vaccine due 09/16/16 Pneumococcal vaccine 13 due, ordered Tdap vaccine up to date. Due 05/23/2024 Shingles vaccine not in records  Advanced directives: DNR in chart, copies of health care power of attorney and living will are needed   Conditions/risks identified: none  Next appointment: Dr. Alwyn Ren makes rounds   Preventive Care 65 Years and Older, Female Preventive care refers to lifestyle choices and visits with your health care provider that can promote health and wellness. What does preventive care include?  A yearly physical exam. This is also called an annual well check.  Dental exams once or twice a year.  Routine eye exams. Ask your health care provider how often you should have your eyes checked.  Personal lifestyle choices, including:  Daily care of your teeth and gums.  Regular physical activity.  Eating a healthy diet.  Avoiding tobacco and drug use.  Limiting alcohol use.  Practicing safe sex.  Taking low-dose aspirin every day.  Taking vitamin and mineral supplements as recommended by your health care provider. What happens during an annual well check? The services and screenings done by your health care provider during your annual well check will depend on your age, overall health, lifestyle risk factors, and family history of disease. Counseling  Your health care provider may ask you questions about your:  Alcohol  use.  Tobacco use.  Drug use.  Emotional well-being.  Home and relationship well-being.  Sexual activity.  Eating habits.  History of falls.  Memory and ability to understand (cognition).  Work and work Astronomer.  Reproductive health. Screening  You may have the following tests or measurements:  Height, weight, and BMI.  Blood pressure.  Lipid and cholesterol levels. These may be checked every 5 years, or more frequently if you are over 79 years old.  Skin check.  Lung cancer screening. You may have this screening every year starting at age 12 if you have a 30-pack-year history of smoking and currently smoke or have quit within the past 15 years.  Fecal occult blood test (FOBT) of the stool. You may have this test every year starting at age 59.  Flexible sigmoidoscopy or colonoscopy. You may have a sigmoidoscopy every 5 years or a colonoscopy every 10 years starting at age 47.  Hepatitis C blood test.  Hepatitis B blood test.  Sexually transmitted disease (STD) testing.  Diabetes screening. This is done by checking your blood sugar (glucose) after you have not eaten for a while (fasting). You may have this done every 1-3 years.  Bone density scan. This is done to screen for osteoporosis. You may have this done starting at age 34.  Mammogram. This may be done every 1-2 years. Talk to your health care provider about how often you should have regular mammograms. Talk with your health care provider about your test results, treatment options, and if necessary, the need for more tests. Vaccines  Your health care provider may recommend certain vaccines, such as:  Influenza vaccine. This is recommended every year.  Tetanus,  diphtheria, and acellular pertussis (Tdap, Td) vaccine. You may need a Td booster every 10 years.  Zoster vaccine. You may need this after age 37.  Pneumococcal 13-valent conjugate (PCV13) vaccine. One dose is recommended after age  61.  Pneumococcal polysaccharide (PPSV23) vaccine. One dose is recommended after age 79. Talk to your health care provider about which screenings and vaccines you need and how often you need them. This information is not intended to replace advice given to you by your health care provider. Make sure you discuss any questions you have with your health care provider. Document Released: 01/29/2015 Document Revised: 09/22/2015 Document Reviewed: 11/03/2014 Elsevier Interactive Patient Education  2017 [place] Prevention in the Home Falls can cause injuries. They can happen to people of all ages. There are many things you can do to make your home safe and to help prevent falls. What can I do on the outside of my home?  Regularly fix the edges of walkways and driveways and fix any cracks.  Remove anything that might make you trip as you walk through a door, such as a raised step or threshold.  Trim any bushes or trees on the path to your home.  Use bright outdoor lighting.  Clear any walking paths of anything that might make someone trip, such as rocks or tools.  Regularly check to see if handrails are loose or broken. Make sure that both sides of any steps have handrails.  Any raised decks and porches should have guardrails on the edges.  Have any leaves, snow, or ice cleared regularly.  Use sand or salt on walking paths during winter.  Clean up any spills in your garage right away. This includes oil or grease spills. What can I do in the bathroom?  Use night lights.  Install grab bars by the toilet and in the tub and shower. Do not use towel bars as grab bars.  Use non-skid mats or decals in the tub or shower.  If you need to sit down in the shower, use a plastic, non-slip stool.  Keep the floor dry. Clean up any water that spills on the floor as soon as it happens.  Remove soap buildup in the tub or shower regularly.  Attach bath mats securely with double-sided  non-slip rug tape.  Do not have throw rugs and other things on the floor that can make you trip. What can I do in the bedroom?  Use night lights.  Make sure that you have a light by your bed that is easy to reach.  Do not use any sheets or blankets that are too big for your bed. They should not hang down onto the floor.  Have a firm chair that has side arms. You can use this for support while you get dressed.  Do not have throw rugs and other things on the floor that can make you trip. What can I do in the kitchen?  Clean up any spills right away.  Avoid walking on wet floors.  Keep items that you use a lot in easy-to-reach places.  If you need to reach something above you, use a strong step stool that has a grab bar.  Keep electrical cords out of the way.  Do not use floor polish or wax that makes floors slippery. If you must use wax, use non-skid floor wax.  Do not have throw rugs and other things on the floor that can make you trip. What can I do with  my stairs?  Do not leave any items on the stairs.  Make sure that there are handrails on both sides of the stairs and use them. Fix handrails that are broken or loose. Make sure that handrails are as long as the stairways.  Check any carpeting to make sure that it is firmly attached to the stairs. Fix any carpet that is loose or worn.  Avoid having throw rugs at the top or bottom of the stairs. If you do have throw rugs, attach them to the floor with carpet tape.  Make sure that you have a light switch at the top of the stairs and the bottom of the stairs. If you do not have them, ask someone to add them for you. What else can I do to help prevent falls?  Wear shoes that:  Do not have high heels.  Have rubber bottoms.  Are comfortable and fit you well.  Are closed at the toe. Do not wear sandals.  If you use a stepladder:  Make sure that it is fully opened. Do not climb a closed stepladder.  Make sure that both  sides of the stepladder are locked into place.  Ask someone to hold it for you, if possible.  Clearly mark and make sure that you can see:  Any grab bars or handrails.  First and last steps.  Where the edge of each step is.  Use tools that help you move around (mobility aids) if they are needed. These include:  Canes.  Walkers.  Scooters.  Crutches.  Turn on the lights when you go into a dark area. Replace any light bulbs as soon as they burn out.  Set up your furniture so you have a clear path. Avoid moving your furniture around.  If any of your floors are uneven, fix them.  If there are any pets around you, be aware of where they are.  Review your medicines with your doctor. Some medicines can make you feel dizzy. This can increase your chance of falling. Ask your doctor what other things that you can do to help prevent falls. This information is not intended to replace advice given to you by your health care provider. Make sure you discuss any questions you have with your health care provider. Document Released: 10/29/2008 Document Revised: 06/10/2015 Document Reviewed: 02/06/2014 Elsevier Interactive Patient Education  2017 [employer].
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
## 2016-09-16 DEATH — deceased

## 2016-10-16 DEATH — deceased

## 2017-11-13 IMAGING — CT CT HEAD W/O CM
3 of 4 series · 15 of 47 positions shown, 18 images · non-contrast
Comparison: 05/24/2014

CLINICAL DATA: Altered mental status

EXAM:
CT HEAD WITHOUT CONTRAST
TECHNIQUE: Contiguous axial images were obtained from the base of the skull
through the vertex without intravenous contrast.

[Series 2: head w/o · axial · non-contrast · 0.42mm/px · z∈[+132,+252]mm · 9 of 30 slices shown, 12 images]
[im 3/30  brain]
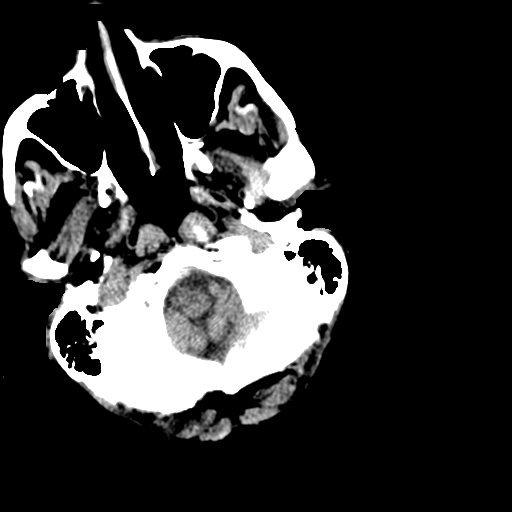
[im 3/30  bone]
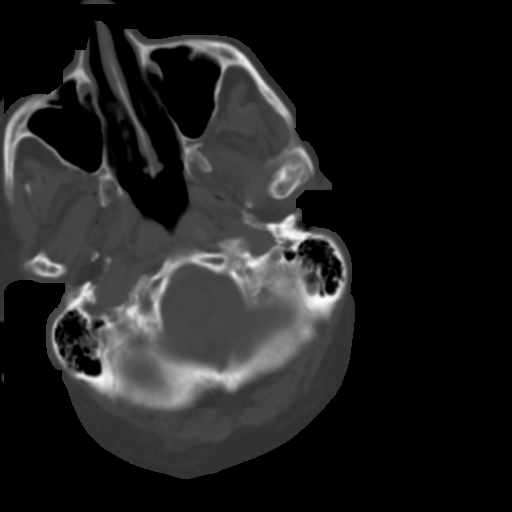
[im 7/30  brain]
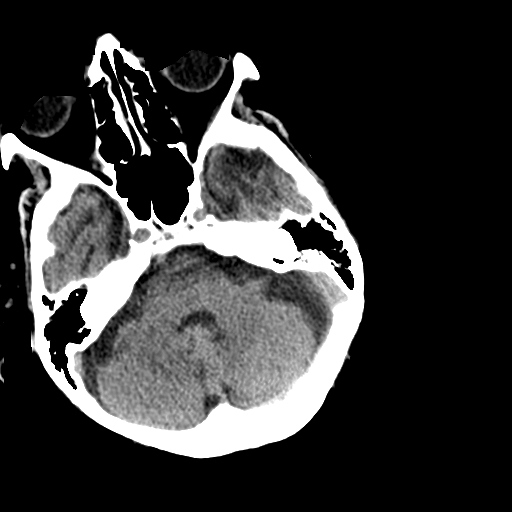
[im 9/30  brain]
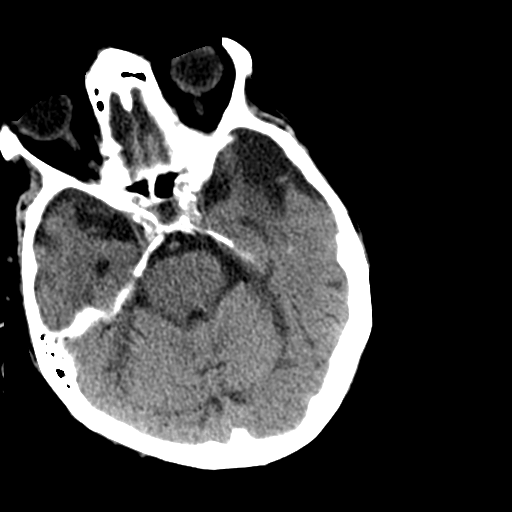
[im 13/30  brain]
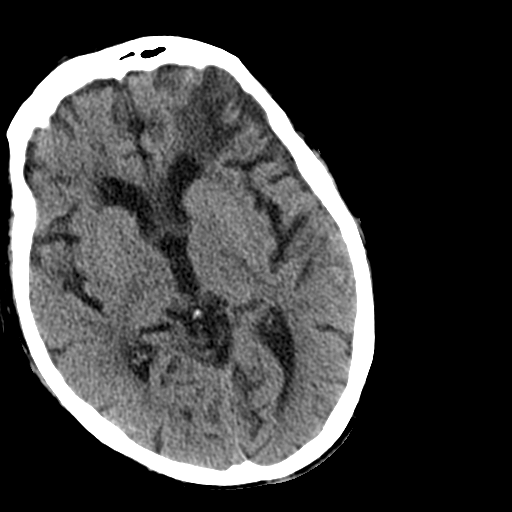
[im 15/30  brain]
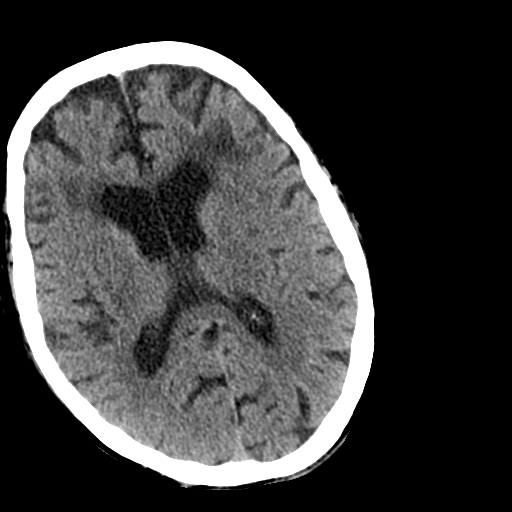
[im 15/30  bone]
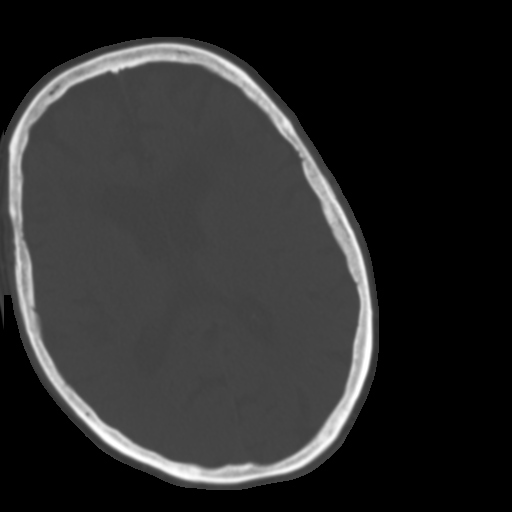
[im 17/30  brain]
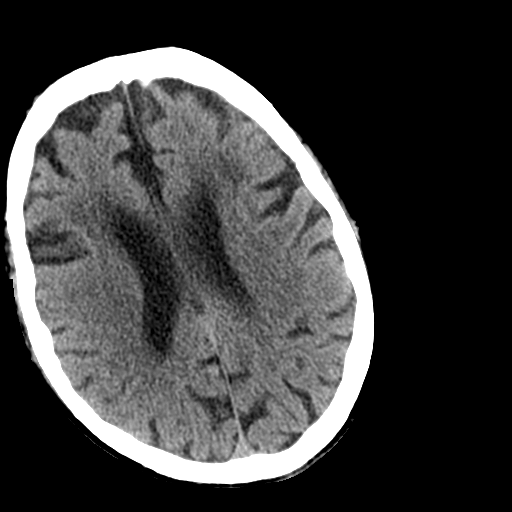
[im 21/30  brain]
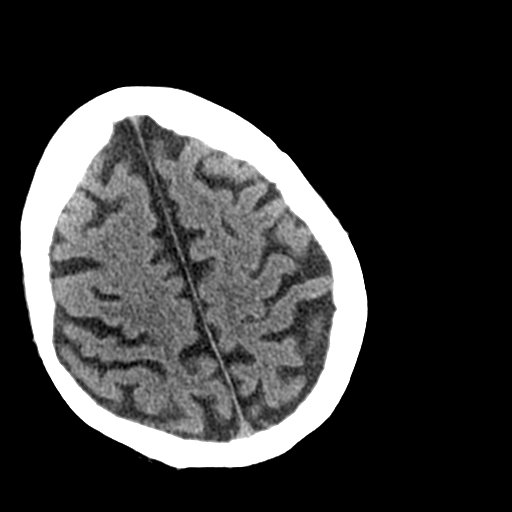
[im 23/30  brain]
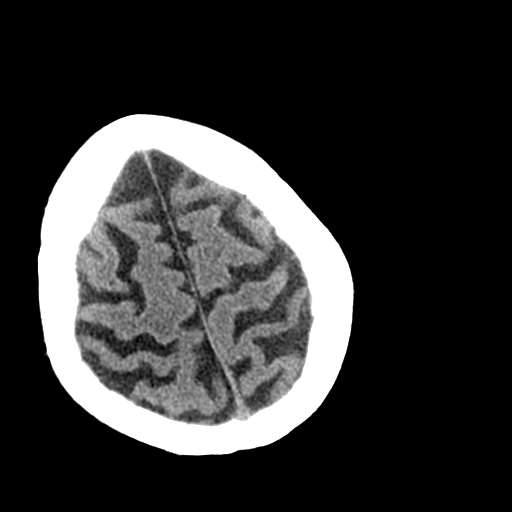
[im 27/30  brain]
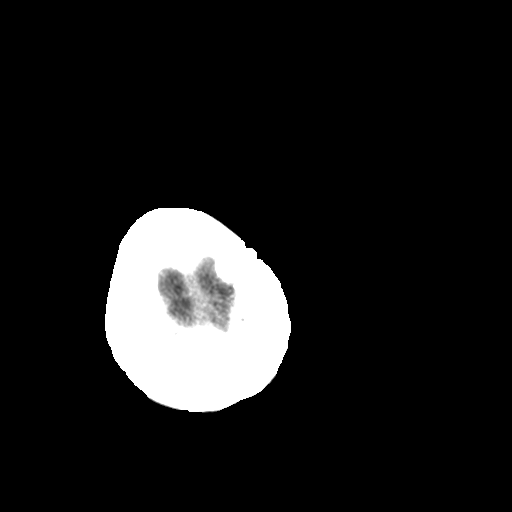
[im 27/30  bone]
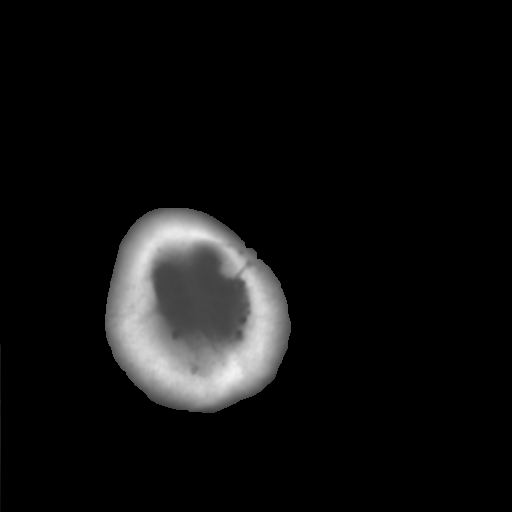

[Series 5: coronal · coronal · 0.29mm/px · 3 of 68 slices shown]
[im 23/68  brain]
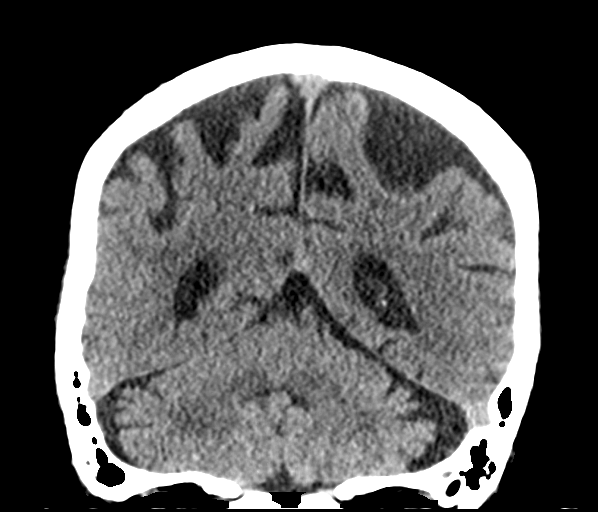
[im 30/68  brain]
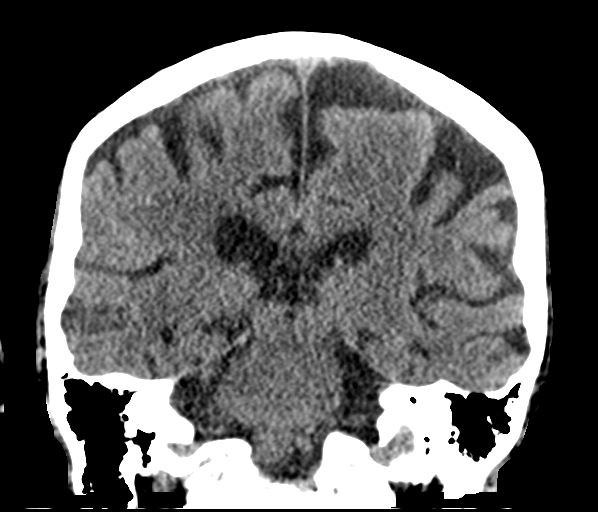
[im 38/68  brain]
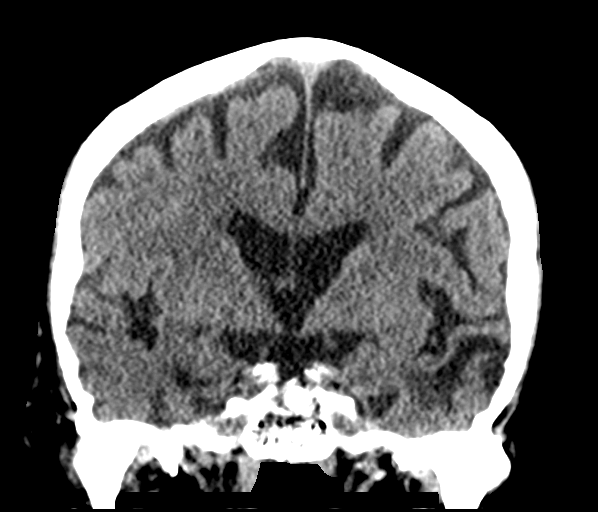

[Series 6: sagittal · sagittal · 0.28mm/px · 3 of 54 slices shown]
[im 18/54  brain]
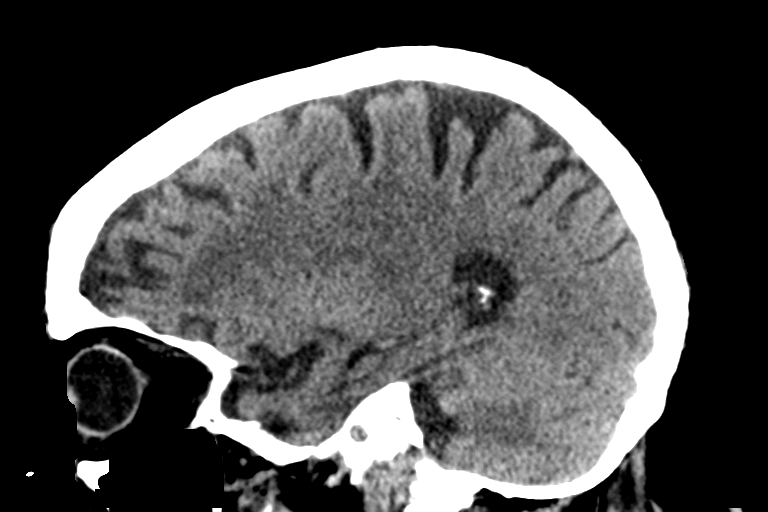
[im 27/54  brain]
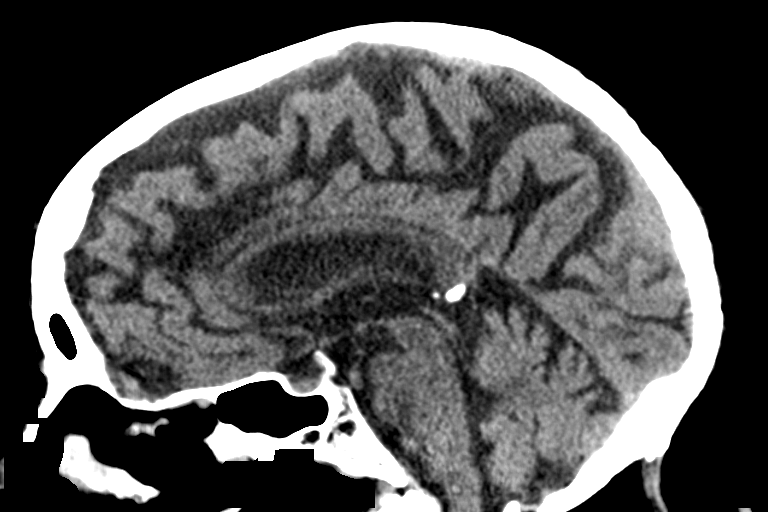
[im 36/54  brain]
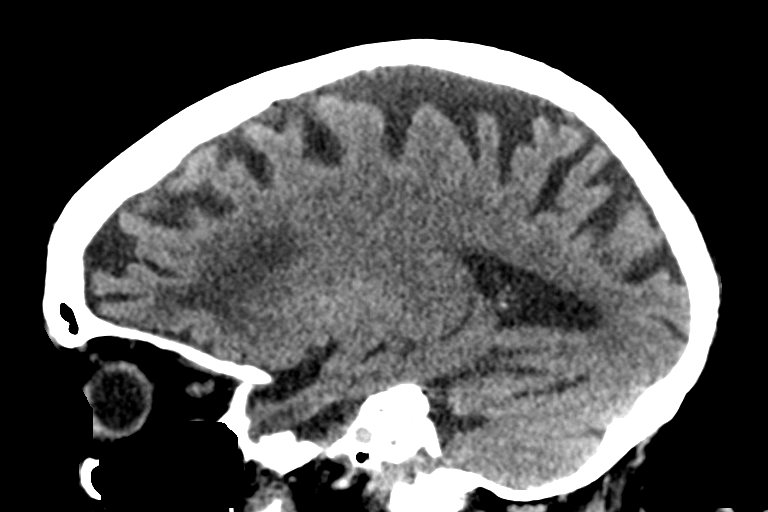

[15 of 47 positions shown; findings below may reference images not displayed]

FINDINGS: Brain: Diffuse atrophic changes and chronic white matter ischemic
change is again identified. No findings to suggest acute hemorrhage,
acute infarction or space-occupying mass lesion are noted.

Vascular: No hyperdense vessel or unexpected calcification.

Skull: Normal. Negative for fracture or focal lesion.

Sinuses/Orbits: No acute finding.

Other: None.
IMPRESSION: Chronic atrophic and white matter ischemic changes. No acute
abnormality noted.
